# Patient Record
Sex: Female | Born: 1950 | Race: White | Hispanic: No | Marital: Married | Smoking: Never smoker
Health system: Southern US, Community
[De-identification: ages and names within clinical notes are randomized; demographics above are authoritative.]

## PROBLEM LIST (undated history)

## (undated) DIAGNOSIS — T7840XA Allergy, unspecified, initial encounter: Secondary | ICD-10-CM

## (undated) DIAGNOSIS — N390 Urinary tract infection, site not specified: Secondary | ICD-10-CM

## (undated) DIAGNOSIS — D649 Anemia, unspecified: Secondary | ICD-10-CM

## (undated) DIAGNOSIS — J189 Pneumonia, unspecified organism: Secondary | ICD-10-CM

## (undated) DIAGNOSIS — H9190 Unspecified hearing loss, unspecified ear: Secondary | ICD-10-CM

## (undated) DIAGNOSIS — E559 Vitamin D deficiency, unspecified: Secondary | ICD-10-CM

## (undated) DIAGNOSIS — M8580 Other specified disorders of bone density and structure, unspecified site: Secondary | ICD-10-CM

## (undated) DIAGNOSIS — F32A Depression, unspecified: Secondary | ICD-10-CM

## (undated) DIAGNOSIS — R7302 Impaired glucose tolerance (oral): Secondary | ICD-10-CM

## (undated) DIAGNOSIS — K219 Gastro-esophageal reflux disease without esophagitis: Secondary | ICD-10-CM

## (undated) DIAGNOSIS — I10 Essential (primary) hypertension: Secondary | ICD-10-CM

## (undated) DIAGNOSIS — F329 Major depressive disorder, single episode, unspecified: Secondary | ICD-10-CM

## (undated) DIAGNOSIS — K648 Other hemorrhoids: Secondary | ICD-10-CM

## (undated) DIAGNOSIS — E7800 Pure hypercholesterolemia, unspecified: Secondary | ICD-10-CM

## (undated) DIAGNOSIS — E663 Overweight: Secondary | ICD-10-CM

## (undated) DIAGNOSIS — K8020 Calculus of gallbladder without cholecystitis without obstruction: Secondary | ICD-10-CM

## (undated) DIAGNOSIS — F419 Anxiety disorder, unspecified: Secondary | ICD-10-CM

## (undated) HISTORY — DX: Anxiety disorder, unspecified: F41.9

## (undated) HISTORY — DX: Essential (primary) hypertension: I10

## (undated) HISTORY — PX: TUBAL LIGATION: SHX77

## (undated) HISTORY — PX: COLONOSCOPY: SHX174

## (undated) HISTORY — DX: Impaired glucose tolerance (oral): R73.02

## (undated) HISTORY — PX: UPPER GASTROINTESTINAL ENDOSCOPY: SHX188

## (undated) HISTORY — PX: POLYPECTOMY: SHX149

## (undated) HISTORY — PX: CHOLECYSTECTOMY: SHX55

## (undated) HISTORY — DX: Calculus of gallbladder without cholecystitis without obstruction: K80.20

## (undated) HISTORY — DX: Unspecified hearing loss, unspecified ear: H91.90

## (undated) HISTORY — PX: GALLBLADDER SURGERY: SHX652

## (undated) HISTORY — DX: Other specified disorders of bone density and structure, unspecified site: M85.80

## (undated) HISTORY — DX: Overweight: E66.3

## (undated) HISTORY — DX: Vitamin D deficiency, unspecified: E55.9

## (undated) HISTORY — DX: Urinary tract infection, site not specified: N39.0

## (undated) HISTORY — DX: Other hemorrhoids: K64.8

## (undated) HISTORY — PX: FOOT SURGERY: SHX648

## (undated) HISTORY — DX: Anemia, unspecified: D64.9

## (undated) HISTORY — DX: Allergy, unspecified, initial encounter: T78.40XA

---

## 1998-07-18 ENCOUNTER — Other Ambulatory Visit: Admission: RE | Admit: 1998-07-18 | Discharge: 1998-07-18 | Payer: Self-pay | Admitting: Family Medicine

## 1998-09-29 ENCOUNTER — Encounter: Admission: RE | Admit: 1998-09-29 | Discharge: 1998-12-28 | Payer: Self-pay | Admitting: Family Medicine

## 1998-10-28 ENCOUNTER — Ambulatory Visit (HOSPITAL_COMMUNITY): Admission: RE | Admit: 1998-10-28 | Discharge: 1998-10-28 | Payer: Self-pay | Admitting: Gastroenterology

## 1999-10-30 ENCOUNTER — Other Ambulatory Visit: Admission: RE | Admit: 1999-10-30 | Discharge: 1999-10-30 | Payer: Self-pay | Admitting: *Deleted

## 1999-11-24 ENCOUNTER — Encounter (INDEPENDENT_AMBULATORY_CARE_PROVIDER_SITE_OTHER): Payer: Self-pay

## 1999-11-24 ENCOUNTER — Ambulatory Visit (HOSPITAL_COMMUNITY): Admission: RE | Admit: 1999-11-24 | Discharge: 1999-11-24 | Payer: Self-pay | Admitting: Obstetrics and Gynecology

## 2001-01-06 ENCOUNTER — Other Ambulatory Visit: Admission: RE | Admit: 2001-01-06 | Discharge: 2001-01-06 | Payer: Self-pay | Admitting: Obstetrics and Gynecology

## 2002-03-29 ENCOUNTER — Other Ambulatory Visit: Admission: RE | Admit: 2002-03-29 | Discharge: 2002-03-29 | Payer: Self-pay | Admitting: Family Medicine

## 2003-10-30 ENCOUNTER — Other Ambulatory Visit: Admission: RE | Admit: 2003-10-30 | Discharge: 2003-10-30 | Payer: Self-pay | Admitting: Family Medicine

## 2005-01-20 ENCOUNTER — Other Ambulatory Visit: Admission: RE | Admit: 2005-01-20 | Discharge: 2005-01-20 | Payer: Self-pay | Admitting: Family Medicine

## 2010-06-05 NOTE — Op Note (Signed)
[facility]  Patient:    Brooke Ellis, Brooke Ellis                      MRN: [id number] Proc. Date: 11/24/99 Adm. Date:  [id number] Attending:  Suezanne Cheshire CC:         Suszanne Conners, M.D.   Operative Report  PREOPERATIVE DIAGNOSIS:  Postmenopausal bleeding due to endometrial polyp.  POSTOPERATIVE DIAGNOSIS:  Postmenopausal bleeding due to endometrial polyp.  OPERATION:  Hysteroscopic resection with curettage.  SURGEON:  Jolyn Nap, M.D.  ANESTHESIA:  MAC sedation with paracervical block.  ESTIMATED BLOOD LOSS:  Less than 20 cc.  SORBITOL INTAKE:  Net intake less than 50 cc.  SPECIMENS: 1. Resection of polyp. 2. Endometrial curettings.  COMPLICATIONS:  None.  INDICATIONS:  Brooke Ellis is a 60 year old menopausal female for the past four years who has been on Evista for the past two years.  She had an episode of postmenopausal bleeding in early October.  She underwent pelvic ultrasound with sonohysterogram revealing a 5 mm endometrial stripe with the sonohysterogram revealing a posterior endometrial polyp measuring 3 x 2 mm. The patient is therefore to undergo hysteroscopic evaluation and resection with curettage as indicated.  Full consent has been given.  DESCRIPTION OF PROCEDURE:  The patient was taken to the operating room and after proper identification and consents were ascertained, she was placed on the operating table in the supine position.  After the induction of MAC sedation, she was placed in the [place] and the perineum and vagina were prepped and draped in the routine sterile fashion.  A transurethral Foley was placed which was removed at the conclusion of the procedure.  Bimanual examination revealed an anterior normal size uterus.  A speculum was placed in the vagina and the cervix grasped with a tenaculum.  A paracervical block utilizing 10 cc of 1% plain Xylocaine was then placed circumferentially.   The internal os was initially dilated to a #23 Pratt dilator with insertion of the viewing scope.  Two small polyps were noted, one on the posterior fundus and one on the right posterior wall just above the lower uterine segment.  The remainder of the endometrial cavity appeared somewhat proliferative in appearance but there were no focal abnormalities.  Both tubal ostia were visualized.  The viewing scope was removed, and the cervix dilated up to a #33 Pratt dilator without difficulty.  The resectoscope was inserted again under direct vision with continuous sorbitol infusion.  The polyps were resected and sent as a separate specimen.  Sharp curettage was then performed which provided a minimal amount of additional tissue.  The patient tolerated this procedure quite well.  There was a minimal amount of bleeding.  All instruments were removed.  There was a slight amount of oozing from the tenaculum site which was stopped with local pressure.  There was no active bleeding from the cervix.  Net sorbitol intake was less than 50 cc.  Estimated blood loss was less than 20 cc.  Specimens were sent to pathology.  The patient was stable on transfer to the recovery room.  DISPOSITION:  She will be observed at discharge per anesthesia.  She will resume her routine medications including her Evista and take Advil or Aleve for any cramping.  She is to follow-up in four to six weeks time or sooner if there are any problems.  She is to call for excessive pain, cramping, fever or any other concerns. DD:  11/24/99 TD:  11/24/99 Job: [id number]

## 2010-10-26 ENCOUNTER — Encounter: Payer: Self-pay | Admitting: *Deleted

## 2010-10-26 ENCOUNTER — Emergency Department (HOSPITAL_BASED_OUTPATIENT_CLINIC_OR_DEPARTMENT_OTHER)
Admission: EM | Admit: 2010-10-26 | Discharge: 2010-10-26 | Disposition: A | Discharge status: Home / Self Care | Payer: BC Managed Care – PPO | Attending: Emergency Medicine | Admitting: Emergency Medicine

## 2010-10-26 ENCOUNTER — Emergency Department (INDEPENDENT_AMBULATORY_CARE_PROVIDER_SITE_OTHER): Payer: BC Managed Care – PPO

## 2010-10-26 DIAGNOSIS — W19XXXA Unspecified fall, initial encounter: Secondary | ICD-10-CM

## 2010-10-26 DIAGNOSIS — S40019A Contusion of unspecified shoulder, initial encounter: Secondary | ICD-10-CM | POA: Insufficient documentation

## 2010-10-26 DIAGNOSIS — E7800 Pure hypercholesterolemia, unspecified: Secondary | ICD-10-CM | POA: Insufficient documentation

## 2010-10-26 DIAGNOSIS — S0093XA Contusion of unspecified part of head, initial encounter: Secondary | ICD-10-CM

## 2010-10-26 DIAGNOSIS — M25519 Pain in unspecified shoulder: Secondary | ICD-10-CM

## 2010-10-26 DIAGNOSIS — S0003XA Contusion of scalp, initial encounter: Secondary | ICD-10-CM | POA: Insufficient documentation

## 2010-10-26 DIAGNOSIS — R079 Chest pain, unspecified: Secondary | ICD-10-CM

## 2010-10-26 DIAGNOSIS — S40029A Contusion of unspecified upper arm, initial encounter: Secondary | ICD-10-CM | POA: Insufficient documentation

## 2010-10-26 DIAGNOSIS — W108XXA Fall (on) (from) other stairs and steps, initial encounter: Secondary | ICD-10-CM | POA: Insufficient documentation

## 2010-10-26 DIAGNOSIS — Z79899 Other long term (current) drug therapy: Secondary | ICD-10-CM | POA: Insufficient documentation

## 2010-10-26 DIAGNOSIS — S1093XA Contusion of unspecified part of neck, initial encounter: Secondary | ICD-10-CM | POA: Insufficient documentation

## 2010-10-26 DIAGNOSIS — K219 Gastro-esophageal reflux disease without esophagitis: Secondary | ICD-10-CM | POA: Insufficient documentation

## 2010-10-26 DIAGNOSIS — Y92009 Unspecified place in unspecified non-institutional (private) residence as the place of occurrence of the external cause: Secondary | ICD-10-CM | POA: Insufficient documentation

## 2010-10-26 DIAGNOSIS — M19019 Primary osteoarthritis, unspecified shoulder: Secondary | ICD-10-CM

## 2010-10-26 HISTORY — DX: Gastro-esophageal reflux disease without esophagitis: K21.9

## 2010-10-26 HISTORY — DX: Major depressive disorder, single episode, unspecified: F32.9

## 2010-10-26 HISTORY — DX: Depression, unspecified: F32.A

## 2010-10-26 HISTORY — DX: Pure hypercholesterolemia, unspecified: E78.00

## 2010-10-26 MED ORDER — HYDROCODONE-ACETAMINOPHEN 5-325 MG PO TABS: 2.0000 | ORAL_TABLET | ORAL | Status: AC | PRN

## 2010-10-26 NOTE — ED Notes (Signed)
Tripped fell on step-pain to right shoulder-right top of head-no LOC

## 2010-10-26 NOTE — ED Provider Notes (Signed)
History     CSN: [id number] Arrival date & time: 10/26/2010  8:54 PM  Chief Complaint  Patient presents with  . Fall    (Consider location/radiation/quality/duration/timing/severity/associated sxs/prior treatment) Patient is a 60 y.o. female presenting with fall. The history is provided by the patient and a relative. No language interpreter was used.  Fall The accident occurred 1 to 2 hours ago. The fall occurred while standing. She fell from a height of 6 to 10 ft. She landed on carpet. There was no blood loss. The point of impact was the right shoulder. The pain is present in the head. The pain is at a severity of 8/10. The pain is moderate. She was ambulatory at the scene. There was no entrapment after the fall. There was no drug use involved in the accident. There was no alcohol use involved in the accident. Pertinent negatives include no visual change. The symptoms are aggravated by activity. She has tried nothing for the symptoms. The treatment provided no relief.  Pt reports she slipped on stairs and fell hitting her head on the board on the side of carpeted steps,  Pt was alone.  She reports she felt like she was going to black out.  Pt's daughter reports when she got there pt's pupils were irregular.  Past Medical History  Diagnosis Date  . High cholesterol   . GERD (gastroesophageal reflux disease)   . Asthma   . Depression     Past Surgical History  Procedure Date  . Cholecystectomy   . Tubal ligation   . Foot surgery     No family history on file.  History  Substance Use Topics  . Smoking status: Never Smoker   . Smokeless tobacco: Not on file  . Alcohol Use: No    OB History    Grav Para Term Preterm Abortions TAB SAB Ect Mult Living                  Review of Systems  Unable to perform ROS All other systems reviewed and are negative.    Allergies  Macrodantin; Morphine and related; and Sulfa antibiotics  Home Medications   Current Outpatient Rx    Name Route Sig Dispense Refill  . BUPROPION HCL (XL) 300 MG PO TB24 Oral Take 300 mg by mouth daily.      [name] Kitchen VITAMIN D 1000 UNITS PO TABS Oral Take 1,000 Units by mouth daily.      [name] Kitchen FLUOXETINE HCL 10 MG PO CAPS Oral Take 10 mg by mouth daily.      [name] Kitchen KETOTIFEN FUMARATE 0.025 % OP SOLN Both Eyes Place 1 drop into both eyes daily as needed. For seasonal allergies     . MONTELUKAST SODIUM 10 MG PO TABS Oral Take 10 mg by mouth at bedtime.      [name] Kitchen ONE-DAILY MULTI VITAMINS PO TABS Oral Take 1 tablet by mouth daily.      [name] Kitchen OMEPRAZOLE 20 MG PO CPDR Oral Take 20 mg by mouth daily.      [name] Kitchen SIMVASTATIN 40 MG PO TABS Oral Take 40 mg by mouth at bedtime.        BP 136/72  Pulse 98  Temp(Src) 98.2 F (36.8 C) (Oral)  Resp 16  Ht 5' (1.524 m)  Wt 150 lb (68.04 kg)  BMI 29.30 kg/m2  SpO2 98%  Physical Exam  Nursing note and vitals reviewed. Constitutional: She appears well-developed and well-nourished.  HENT:  Head: Normocephalic and atraumatic.  Right  Ear: External ear normal.  Left Ear: External ear normal.  Nose: Nose normal.  Mouth/Throat: Oropharynx is clear and moist.  Eyes: Conjunctivae and EOM are normal. Pupils are equal, round, and reactive to light.  Neck: Normal range of motion. Neck supple.  Cardiovascular: Normal rate and regular rhythm.   Pulmonary/Chest: Effort normal and breath sounds normal.  Abdominal: Soft.  Musculoskeletal: She exhibits tenderness.       Tender right shoulder and right upper ribs,  Tender area right forehead  Neurological: She is alert. A cranial nerve deficit is present. Coordination abnormal.  Skin: Skin is warm.  Psychiatric: She has a normal mood and affect.    ED Course  Procedures (including critical care time)  Labs Reviewed - No data to display Dg Ribs Unilateral W/chest Right  10/26/2010  *RADIOLOGY REPORT*  Clinical Data: Rib pain status post fall.  RIGHT RIBS AND CHEST - 3+ VIEW  Comparison: Contemporaneous shoulder radiograph  Findings:  Lungs are clear.  No displaced rib fracture identified. Surgical clips right upper quadrant.  IMPRESSION: No displaced fracture identified.  Original Report Authenticated By: Suanne Marker, M.D.   Dg Shoulder Right  10/26/2010  *RADIOLOGY REPORT*  Clinical Data: Right shoulder pain status post fall  RIGHT SHOULDER - 2+ VIEW  Comparison:  None  Findings: No acute fracture or dislocation.  Mild AC joint DJD. Right lung is clear.  IMPRESSION: No acute osseous abnormality.  Mild AC joint DJD.  Original Report Authenticated By: Suanne Marker, M.D.   Ct Head Wo Contrast  10/26/2010  *RADIOLOGY REPORT*  Clinical Data: Fall, trauma to the right side of the head.  CT HEAD WITHOUT CONTRAST  Technique:  Contiguous axial images were obtained from the base of the skull through the vertex without contrast.  Comparison: None.  Findings: There is no evidence for acute hemorrhage, hydrocephalus, mass lesion, or abnormal extra-axial fluid collection.  No definite CT evidence for acute infarction.  The visualized paranasal sinuses and mastoid air cells are predominately clear.  No displaced calvarial fracture. Small right superior scalp hematoma.  IMPRESSION: Small right scalp hematoma.  No underlying calvarial fracture or acute intracranial abnormality.  Original Report Authenticated By: Suanne Marker, M.D.     No diagnosis found.    MDM    No shoulder or rib fracture,  Ct head no fx      Alyse Low, [place] 10/26/10 [id number]

## 2010-10-26 NOTE — ED Provider Notes (Signed)
Medical screening examination/treatment/procedure(s) were performed by non-physician practitioner and as supervising physician I was immediately available for consultation/collaboration.   Hoy Morn, MD 10/26/10 [phone]

## 2012-03-10 ENCOUNTER — Emergency Department (HOSPITAL_BASED_OUTPATIENT_CLINIC_OR_DEPARTMENT_OTHER)
Admission: EM | Admit: 2012-03-10 | Discharge: 2012-03-10 | Disposition: A | Discharge status: Home / Self Care | Payer: BC Managed Care – PPO | Attending: Emergency Medicine | Admitting: Emergency Medicine

## 2012-03-10 ENCOUNTER — Encounter (HOSPITAL_BASED_OUTPATIENT_CLINIC_OR_DEPARTMENT_OTHER): Payer: Self-pay

## 2012-03-10 DIAGNOSIS — F3289 Other specified depressive episodes: Secondary | ICD-10-CM | POA: Insufficient documentation

## 2012-03-10 DIAGNOSIS — Y9289 Other specified places as the place of occurrence of the external cause: Secondary | ICD-10-CM | POA: Insufficient documentation

## 2012-03-10 DIAGNOSIS — Z79899 Other long term (current) drug therapy: Secondary | ICD-10-CM | POA: Insufficient documentation

## 2012-03-10 DIAGNOSIS — J45909 Unspecified asthma, uncomplicated: Secondary | ICD-10-CM | POA: Insufficient documentation

## 2012-03-10 DIAGNOSIS — E7800 Pure hypercholesterolemia, unspecified: Secondary | ICD-10-CM | POA: Insufficient documentation

## 2012-03-10 DIAGNOSIS — Y9389 Activity, other specified: Secondary | ICD-10-CM | POA: Insufficient documentation

## 2012-03-10 DIAGNOSIS — K219 Gastro-esophageal reflux disease without esophagitis: Secondary | ICD-10-CM | POA: Insufficient documentation

## 2012-03-10 DIAGNOSIS — S335XXA Sprain of ligaments of lumbar spine, initial encounter: Secondary | ICD-10-CM | POA: Insufficient documentation

## 2012-03-10 DIAGNOSIS — X500XXA Overexertion from strenuous movement or load, initial encounter: Secondary | ICD-10-CM | POA: Insufficient documentation

## 2012-03-10 MED ORDER — ORPHENADRINE CITRATE ER 100 MG PO TB12: 100.0000 mg | ORAL_TABLET | Freq: Two times a day (BID) | ORAL | Status: DC | PRN

## 2012-03-10 NOTE — ED Provider Notes (Signed)
History     CSN: [id number]  Arrival date & time 03/10/12  0919   First MD Initiated Contact with Patient 03/10/12 [phone]      Chief Complaint  Patient presents with  . Fall  . Back Pain    (Consider location/radiation/quality/duration/timing/severity/associated sxs/prior treatment) Patient is a 62 y.o. female presenting with fall and back pain. The history is provided by the patient.  Fall  Back Pain She slipped on a wet deck and twisted her lower back. She fell against the house but did not actually fall to the ground. She is complaining of pain in her lower back which is worse if she tries to twist or turn. Pain is moderate in intensity and was 5/10 at its worst. It is 3/10 currently. She denies any weakness or numbness feeling. She denies other injury.  Past Medical History  Diagnosis Date  . High cholesterol   . GERD (gastroesophageal reflux disease)   . Asthma   . Depression     Past Surgical History  Procedure Laterality Date  . Cholecystectomy    . Tubal ligation    . Foot surgery      No family history on file.  History  Substance Use Topics  . Smoking status: Never Smoker   . Smokeless tobacco: Not on file  . Alcohol Use: No    OB History   Grav Para Term Preterm Abortions TAB SAB Ect Mult Living                  Review of Systems  Musculoskeletal: Positive for back pain.  All other systems reviewed and are negative.    Allergies  Macrodantin; Morphine and related; and Sulfa antibiotics  Home Medications   Current Outpatient Rx  Name  Route  Sig  Dispense  Refill  . buPROPion (WELLBUTRIN XL) 300 MG 24 hr tablet   Oral   Take 300 mg by mouth daily.           . cholecalciferol (VITAMIN D) 1000 UNITS tablet   Oral   Take 1,000 Units by mouth daily.           [name] Kitchen FLUoxetine (PROZAC) 10 MG capsule   Oral   Take 10 mg by mouth daily.           [name] Kitchen ketotifen (ZADITOR) 0.025 % ophthalmic solution   Both Eyes   Place 1 drop into both  eyes daily as needed. For seasonal allergies          . montelukast (SINGULAIR) 10 MG tablet   Oral   Take 10 mg by mouth at bedtime.           . Multiple Vitamin (MULTIVITAMIN) tablet   Oral   Take 1 tablet by mouth daily.           [name] Kitchen omeprazole (PRILOSEC) 20 MG capsule   Oral   Take 20 mg by mouth daily.           . simvastatin (ZOCOR) 40 MG tablet   Oral   Take 40 mg by mouth at bedtime.             BP 127/83  Pulse 87  Temp(Src) 97.9 F (36.6 C) (Oral)  Resp 18  Ht 5' (1.524 m)  Wt 150 lb (68.04 kg)  BMI 29.3 kg/m2  SpO2 97%  Physical Exam  Nursing note and vitals reviewed.  62 year old female, resting comfortably and in no acute distress. Vital signs are  normal. Oxygen saturation is 97%, which is normal. Head is normocephalic and atraumatic. PERRLA, EOMI. Oropharynx is clear. Neck is nontender and supple without adenopathy or JVD. Back is nontender and there is no CVA tenderness. Mild to moderate paralumbar spasm is present bilaterally. Straight leg raise is negative. Lungs are clear without rales, wheezes, or rhonchi. Chest is nontender. Heart has regular rate and rhythm without murmur. Abdomen is soft, flat, nontender without masses or hepatosplenomegaly and peristalsis is normoactive. Extremities have no cyanosis or edema, full range of motion is present. Skin is warm and dry without rash. Neurologic: Mental status is normal, cranial nerves are intact, there are no motor or sensory deficits.  ED Course  Procedures (including critical care time)   1. Fall   2. Lumbar strain       MDM  Acute lumbosacral strain. She's given a prescription for orphenadrine to use as needed and she is instructed to use over-the-counter NSAIDs as needed.        Delora Fuel, MD [id number]

## 2012-03-10 NOTE — ED Notes (Signed)
Pt reports she slipped on the wet deck, twisting her back.  She now has back pain.

## 2013-08-27 ENCOUNTER — Ambulatory Visit (INDEPENDENT_AMBULATORY_CARE_PROVIDER_SITE_OTHER): Payer: BC Managed Care – PPO | Admitting: Podiatry

## 2013-08-27 ENCOUNTER — Encounter: Payer: Self-pay | Admitting: Podiatry

## 2013-08-27 ENCOUNTER — Ambulatory Visit (INDEPENDENT_AMBULATORY_CARE_PROVIDER_SITE_OTHER): Payer: BC Managed Care – PPO

## 2013-08-27 VITALS — BP 127/70 | HR 100 | Resp 18

## 2013-08-27 DIAGNOSIS — D492 Neoplasm of unspecified behavior of bone, soft tissue, and skin: Secondary | ICD-10-CM

## 2013-08-27 DIAGNOSIS — R52 Pain, unspecified: Secondary | ICD-10-CM

## 2013-08-27 NOTE — Progress Notes (Signed)
   Subjective:    Patient ID: Brooke Ellis, female    DOB: June 30, 1950, 63 y.o.   MRN: [id number]  HPI THERE IS A LUMP ON MY LEFT FOOT AND HAS BEEN THERE ABOUT 3 WEEKS AND IT DID HURT IN THE BEGINNING AND NOW IT IS JUST THERE AND IT HASN'T GOTTEN ANY BIGGER  This patient describes initial localized discomfort in the plantar soft tissue lesion on the left foot, however, now there is no discomfort in the area.  Review of Systems  All other systems reviewed and are negative.      Objective:   Physical Exam  Orientated x3 white female  Vascular: DP and PT pulses 2/4 bilaterally  Neurological: Sensation to 10 g monofilament wire intact 5/5 bilaterally Vibratory sensation intact bilaterally Ankle reflex equal and reactive bilaterally  Dermatological: Well-healed faint surgical scars dorsal first MPJ bilaterally Palpable soft tissue thickening plantar left first MPJ 20 mm in diameter that appears to be more adherent to the overlying skin versus the fascial slip. This palpable lesion is not tender.  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

## 2013-08-28 ENCOUNTER — Encounter: Payer: Self-pay | Admitting: Podiatry

## 2013-11-13 ENCOUNTER — Telehealth: Payer: Self-pay | Admitting: Internal Medicine

## 2013-11-27 ENCOUNTER — Encounter: Payer: Self-pay | Admitting: Internal Medicine

## 2013-12-03 ENCOUNTER — Ambulatory Visit: Payer: BC Managed Care – PPO | Admitting: Podiatry

## 2014-01-23 ENCOUNTER — Encounter: Payer: Self-pay | Admitting: Internal Medicine

## 2014-01-23 ENCOUNTER — Ambulatory Visit (INDEPENDENT_AMBULATORY_CARE_PROVIDER_SITE_OTHER): Payer: BLUE CROSS/BLUE SHIELD | Admitting: Internal Medicine

## 2014-01-23 ENCOUNTER — Other Ambulatory Visit (INDEPENDENT_AMBULATORY_CARE_PROVIDER_SITE_OTHER): Payer: BLUE CROSS/BLUE SHIELD

## 2014-01-23 VITALS — BP 130/72 | HR 84 | Ht 59.25 in | Wt 156.0 lb

## 2014-01-23 DIAGNOSIS — D509 Iron deficiency anemia, unspecified: Secondary | ICD-10-CM

## 2014-01-23 DIAGNOSIS — K219 Gastro-esophageal reflux disease without esophagitis: Secondary | ICD-10-CM

## 2014-01-23 LAB — IGA: IgA: 181 mg/dL (ref 68–378)

## 2014-01-23 MED ORDER — MOVIPREP 100 G PO SOLR: 1.0000 | Freq: Once | ORAL | Status: DC

## 2014-01-23 NOTE — Progress Notes (Signed)
HISTORY OF PRESENT ILLNESS:  Brooke Ellis is a 64 y.o. female with past medical history as listed below. She is sent today, by Dr. Sandi Ellis, regarding iron deficiency anemia. Outside records and cover letter reviewed. The patient presented for her routine physical examination 11/07/2013. Laboratories revealed a hemoglobin of 9.3. She was microcytic with iron deficiency (ferritin level 5). At that time, she felt nonspecific fatigue. In addition, breakthrough reflux symptoms on omeprazole 20 mg daily (previously 40 mg daily). Her omeprazole no was increased to 20 mg twice a day and she was placed on iron sulfate 325 mg daily. Follow-up hemoglobin on iron therapy was improved at 11.0. Patient reports better control of reflux symptoms. GI review of systems is otherwise remarkable for occasional constipation or diarrhea. However, no change from her baseline. She did submit Hemoccult cards in December which were negative. The patient denies being a blood donor. No vaginal or urologic bleeding. No family history of blood dyscrasia. Her husband has celiac disease. Her daughter has Crohn's disease. The patient did undergo routine screening colonoscopy with Dr. Teena Ellis 05/28/2008. Examination was normal except for internal hemorrhoids.  REVIEW OF SYSTEMS:  All non-GI ROS negative except for fatigue  Past Medical History  Diagnosis Date  . High cholesterol   . GERD (gastroesophageal reflux disease)   . Asthma   . Depression   . Internal hemorrhoids   . Vitamin D deficiency   . Hearing loss   . Impaired glucose tolerance   . Osteopenia   . Overweight   . Anemia   . Gallstones   . Hypertension   . Urinary tract infection     Past Surgical History  Procedure Laterality Date  . Cholecystectomy    . Tubal ligation    . Foot surgery    . Gallbladder surgery      Social History Brooke Ellis  reports that she has never smoked. She has never used smokeless tobacco. She reports that she does  not drink alcohol or use illicit drugs.  family history includes Crohn's disease in her daughter; Diverticulitis in her sister. There is no history of Colon cancer, Colon polyps, Diabetes, Kidney disease, Gallbladder disease, or Esophageal cancer.  Allergies  Allergen Reactions  . Macrodantin     Chemical pneumonia   . Morphine And Related     hallucinations   . Sulfa Antibiotics Itching and Swelling       PHYSICAL EXAMINATION: Vital signs: BP 130/72 mmHg  Pulse 84  Ht 4' 11.25" (1.505 m)  Wt 156 lb (70.761 kg)  BMI 31.24 kg/m2  Constitutional: generally well-appearing, no acute distress Psychiatric: alert and oriented x3, cooperative Eyes: extraocular movements intact, anicteric, conjunctiva pink Mouth: oral pharynx moist, no lesions Neck: supple no lymphadenopathy Cardiovascular: heart regular rate and rhythm, no murmur Lungs: clear to auscultation bilaterally Abdomen: soft, nontender, nondistended, no obvious ascites, no peritoneal signs, normal bowel sounds, no organomegaly Rectal: Deferred until colonoscopy Extremities: no lower extremity edema bilaterally Skin: no lesions on visible extremities Neuro: No focal deficits.   ASSESSMENT:  #1. Iron deficiency anemia. Etiology unclear. Hemoccult negative stool. Rule out GI mucosal lesions such as interval colorectal neoplasia, gastrointestinal AVMs, or Cameron erosions associated with hiatal hernia. Also, rule out iron absorption disorder (e.g. celiac disease). #2. Chronic GERD #3. Screening colonoscopy 2010. Negative   PLAN:  #1. Colonoscopy and upper endoscopy to evaluate iron deficiency anemia.The nature of the procedure, as well as the risks, benefits, and alternatives were carefully and thoroughly reviewed with  the patient. Ample time for discussion and questions allowed. The patient understood, was satisfied, and agreed to proceed. #2. Continue iron until one week prior to colonoscopy, then hold to help facilitate  prep #3. Hold metformin prior to the procedure #4. Screen for celiac disease with tissue transglutaminase antibody IgA and serum IgA level #5. If above workup is unrevealing, then consider capsule endoscopy. We discussed this in detail.

## 2014-01-23 NOTE — Patient Instructions (Addendum)
You have been scheduled for an endoscopy and colonoscopy. Please follow the written instructions given to you at your visit today. Please pick up your prep at the pharmacy within the next 1-3 days. If you use inhalers (even only as needed), please bring them with you on the day of your procedure. Your physician has requested that you go to www.startemmi.com and enter the access code given to you at your visit today. This web site gives a general overview about your procedure. However, you should still follow specific instructions given to you by our office regarding your preparation for the procedure.   Your physician has requested that you go to the basement for the following lab work before leaving today:  TTG, IGA

## 2014-01-24 LAB — TISSUE TRANSGLUTAMINASE, IGA: Tissue Transglutaminase Ab, IgA: 1 U/mL (ref <4)

## 2014-01-28 NOTE — Telephone Encounter (Signed)
Pt seen

## 2014-01-29 ENCOUNTER — Encounter: Payer: Self-pay | Admitting: Internal Medicine

## 2014-02-14 ENCOUNTER — Encounter: Payer: Self-pay | Admitting: Internal Medicine

## 2014-02-14 ENCOUNTER — Ambulatory Visit (AMBULATORY_SURGERY_CENTER): Payer: BLUE CROSS/BLUE SHIELD | Admitting: Internal Medicine

## 2014-02-14 VITALS — BP 138/66 | HR 74 | Temp 98.4°F | Resp 27 | Ht 59.0 in | Wt 156.0 lb

## 2014-02-14 DIAGNOSIS — D123 Benign neoplasm of transverse colon: Secondary | ICD-10-CM

## 2014-02-14 DIAGNOSIS — K449 Diaphragmatic hernia without obstruction or gangrene: Secondary | ICD-10-CM

## 2014-02-14 DIAGNOSIS — K2100 Gastro-esophageal reflux disease with esophagitis, without bleeding: Secondary | ICD-10-CM

## 2014-02-14 DIAGNOSIS — D509 Iron deficiency anemia, unspecified: Secondary | ICD-10-CM

## 2014-02-14 DIAGNOSIS — D120 Benign neoplasm of cecum: Secondary | ICD-10-CM

## 2014-02-14 MED ORDER — SODIUM CHLORIDE 0.9 % IV SOLN: 500.0000 mL | INTRAVENOUS | Status: DC

## 2014-02-14 MED ORDER — OMEPRAZOLE 40 MG PO CPDR: DELAYED_RELEASE_CAPSULE | ORAL | Status: AC

## 2014-02-14 NOTE — Patient Instructions (Signed)
Discharge instructions given. Handouts on polyps,diverticulosis, and hiatal hernia. Repeat CBC in 4 weeks office will schedule. Resume previous medications. Your may notice some irritation in your nose or some drainage.  This may cause feelings of congestion.  This is from the oxygen, which can be very drying.  There is no need for concern, this should clear up in a day or so YOU HAD AN ENDOSCOPIC PROCEDURE TODAY AT [facility]: Refer to the procedure report that was given to you for any specific questions about what was found during the examination.  If the procedure report does not answer your questions, please call your gastroenterologist to clarify.  If you requested that your care partner not be given the details of your procedure findings, then the procedure report has been included in a sealed envelope for you to review at your convenience later.  YOU SHOULD EXPECT: Some feelings of bloating in the abdomen. Passage of more gas than usual.  Walking can help get rid of the air that was put into your GI tract during the procedure and reduce the bloating. If you had a lower endoscopy (such as a colonoscopy or flexible sigmoidoscopy) you may notice spotting of blood in your stool or on the toilet paper. If you underwent a bowel prep for your procedure, then you may not have a normal bowel movement for a few days.  DIET: Your first meal following the procedure should be a light meal and then it is ok to progress to your normal diet.  A half-sandwich or bowl of soup is an example of a good first meal.  Heavy or fried foods are harder to digest and may make you feel nauseous or bloated.  Likewise meals heavy in dairy and vegetables can cause extra gas to form and this can also increase the bloating.  Drink plenty of fluids but you should avoid alcoholic beverages for 24 hours.  ACTIVITY: Your care partner should take you home directly after the procedure.  You should plan to take it easy,  moving slowly for the rest of the day.  You can resume normal activity the day after the procedure however you should NOT DRIVE or use heavy machinery for 24 hours (because of the sedation medicines used during the test).    SYMPTOMS TO REPORT IMMEDIATELY: A gastroenterologist can be reached at any hour.  During normal business hours, 8:30 AM to 5:00 PM Monday through Friday, call [phone].  After hours and on weekends, please call the GI answering service at [phone] who will take a message and have the physician on call contact you.   Following lower endoscopy (colonoscopy or flexible sigmoidoscopy):  Excessive amounts of blood in the stool  Significant tenderness or worsening of abdominal pains  Swelling of the abdomen that is new, acute  Fever of 100F or higher  Following upper endoscopy (EGD)  Vomiting of blood or coffee ground material  New chest pain or pain under the shoulder blades  Painful or persistently difficult swallowing  New shortness of breath  Fever of 100F or higher  Black, tarry-looking stools  FOLLOW UP: If any biopsies were taken you will be contacted by phone or by letter within the next 1-3 weeks.  Call your gastroenterologist if you have not heard about the biopsies in 3 weeks.  Our staff will call the home number listed on your records the next business day following your procedure to check on you and address any questions or concerns  that you may have at that time regarding the information given to you following your procedure. This is a courtesy call and so if there is no answer at the home number and we have not heard from you through the emergency physician on call, we will assume that you have returned to your regular daily activities without incident.  SIGNATURES/CONFIDENTIALITY: You and/or your care partner have signed paperwork which will be entered into your electronic medical record.  These signatures attest to the fact that that the  information above on your After Visit Summary has been reviewed and is understood.  Full responsibility of the confidentiality of this discharge information lies with you and/or your care-partner.

## 2014-02-14 NOTE — Progress Notes (Signed)
Patient awakening,vss,report to rn 

## 2014-02-14 NOTE — Op Note (Signed)
[place]  [address] [place], [id number]   ENDOSCOPY PROCEDURE REPORT  PATIENT: Brooke Ellis, Brooke Ellis  MR#: [id number] BIRTHDATE: 1951-01-14 , 63  yrs. old GENDER: female ENDOSCOPIST: Eustace Quail, MD REFERRED BY:  Derinda Late, M.D. PROCEDURE DATE:  02/14/2014 PROCEDURE:  EGD, diagnostic ASA CLASS:     Class II INDICATIONS:  history of esophageal reflux and iron deficiency anemia. MEDICATIONS: Monitored anesthesia care and Propofol 200 mg IV TOPICAL ANESTHETIC: none  DESCRIPTION OF PROCEDURE: After the risks benefits and alternatives of the procedure were thoroughly explained, informed consent was obtained.  The LB [id number] endoscope was introduced through the mouth and advanced to the second portion of the duodenum , Without limitations.  The instrument was slowly withdrawn as the mucosa was fully examined.  EXAM:The esophagus was foreshortened with free refluxate.  The distal mucosa was inflamed with probable short segment Barrett's esophagus.  The stomach revealed a large (7 cm) hiatal hernia with associated Cameron erosions and associated low-grade bleeding.  The stomach was otherwise normal.  The duodenum was normal. Retroflexed views revealed a hiatal hernia.     The scope was then withdrawn from the patient and the procedure completed. COMPLICATIONS: There were no immediate complications.  ENDOSCOPIC IMPRESSION: 1. GERD with esophagitis and probable short segment Barrett's 2. Large hiatal hernia with associated Cameron erosions. This is the cause for iron deficiency anemia  RECOMMENDATIONS: 1.  Resume iron therapy 325 mg twice daily for 4 weeks then once daily indefinitely 2. Increase omeprazole to 40 mg twice daily 3. Repeat CBC in 4 weeks 4. Schedule repeat upper Endoscopy with Dr. Henrene Pastor in 8 weeks. We will assess for mucosal healing and underlying Barrett's esophagus.  REPEAT EXAM:  eSigned:  Eustace Quail, MD 02/14/2014  3:22 PM    [id number] Blomgren, MD and The Patient

## 2014-02-14 NOTE — Progress Notes (Signed)
Called to room to assist during endoscopic procedure.  Patient ID and intended procedure confirmed with present staff. Received instructions for my participation in the procedure from the performing physician.  

## 2014-02-14 NOTE — Op Note (Signed)
[place]  [address] [place], [id number]   COLONOSCOPY PROCEDURE REPORT  PATIENT: Brooke Ellis, Brooke Ellis  MR#: [id number] BIRTHDATE: 1950/11/02 , 63  yrs. old GENDER: female ENDOSCOPIST: Eustace Quail, MD REFERRED [id number] Sandi Mariscal, M.D. PROCEDURE DATE:  02/14/2014 PROCEDURE:   Colonoscopy with snare polypectomy x 2 First Screening Colonoscopy - Avg.  risk and is 50 yrs.  old or older - No.  Prior Negative Screening - Now for repeat screening. N/A  History of Adenoma - Now for follow-up colonoscopy & has been > or = to 3 yrs.  N/A  Polyps Removed Today? Yes. ASA CLASS:   Class II INDICATIONS:iron deficiency anemia. Previous colonoscopy with Dr. Amedeo Plenty May 2010 without neoplasia. MEDICATIONS: Monitored anesthesia care and Propofol 300 mg IV  DESCRIPTION OF PROCEDURE:   After the risks benefits and alternatives of the procedure were thoroughly explained, informed consent was obtained.  The digital rectal exam revealed no abnormalities of the rectum.   The LB [id number]  endoscope was introduced through the anus and advanced to the cecum, which was identified by both the appendix and ileocecal valve. No adverse events experienced.   The quality of the prep was excellent, using MoviPrep  The instrument was then slowly withdrawn as the colon was fully examined.  COLON FINDINGS: The examined terminal ileum appeared to be normal. Two polyps measuring 5 mm in size were found in the transverse colon and at the cecum.  A polypectomy was performed with a cold snare.  The resection was complete, the polyp tissue was completely retrieved and sent to histology.   There was mild diverticulosis noted in the sigmoid colon.   The examination was otherwise normal. Retroflexed views revealed internal hemorrhoids. The time to cecum=2 minutes 19 seconds.  Withdrawal time=10 minutes 34 seconds. The scope was withdrawn and the procedure completed. COMPLICATIONS: There were no  immediate complications.  ENDOSCOPIC IMPRESSION: 1.   The examined terminal ileum appeared to be normal 2.   Two polyps were found in the transverse colon and at the cecum; polypectomy was performed with a cold snare 3.   Mild diverticulosis was noted in the sigmoid colon 4.   The examination was otherwise normal  RECOMMENDATIONS: 1.  Repeat colonoscopy in 5 years if polyp adenomatous; otherwise 10 years 2.  Upper endoscopy performed today (please see report)  eSigned:  Eustace Quail, MD 02/14/2014 3:14 PM   cc: Derinda Late, MD and The Patient

## 2014-02-15 ENCOUNTER — Other Ambulatory Visit: Payer: Self-pay

## 2014-02-15 ENCOUNTER — Telehealth: Payer: Self-pay | Admitting: *Deleted

## 2014-02-15 DIAGNOSIS — D6489 Other specified anemias: Secondary | ICD-10-CM

## 2014-02-15 NOTE — Telephone Encounter (Signed)
  Follow up Call-  Call back number 02/14/2014  Post procedure Call Back phone  # [phone]  Permission to leave phone message Yes     Patient questions:  Do you have a fever, pain , or abdominal swelling? No. Pain Score  0 *  Have you tolerated food without any problems? Yes.    Have you been able to return to your normal activities? Yes.    Do you have any questions about your discharge instructions: Diet   No. Medications  No. Follow up visit  No.  Do you have questions or concerns about your Care? No   Actions: * If pain score is 4 or above: No action needed, pain <4.  Pt did leave an inhaler here, found under bed in [place] [address]. Will tag and put at front desk for patient to pick up.

## 2014-02-19 ENCOUNTER — Encounter: Payer: Self-pay | Admitting: Internal Medicine

## 2014-03-13 ENCOUNTER — Telehealth: Payer: Self-pay

## 2014-03-13 ENCOUNTER — Other Ambulatory Visit (INDEPENDENT_AMBULATORY_CARE_PROVIDER_SITE_OTHER): Payer: BLUE CROSS/BLUE SHIELD

## 2014-03-13 DIAGNOSIS — D6489 Other specified anemias: Secondary | ICD-10-CM

## 2014-03-13 LAB — CBC WITH DIFFERENTIAL/PLATELET
BASOS ABS: 0.1 10*3/uL (ref 0.0–0.1)
Basophils Relative: 1.1 % (ref 0.0–3.0)
Eosinophils Absolute: 0.1 10*3/uL (ref 0.0–0.7)
Eosinophils Relative: 2.6 % (ref 0.0–5.0)
HCT: 38.7 % (ref 36.0–46.0)
Hemoglobin: 13 g/dL (ref 12.0–15.0)
LYMPHS ABS: 2.1 10*3/uL (ref 0.7–4.0)
LYMPHS PCT: 37.1 % (ref 12.0–46.0)
MCHC: 33.7 g/dL (ref 30.0–36.0)
MCV: 80.7 fl (ref 78.0–100.0)
MONOS PCT: 8.9 % (ref 3.0–12.0)
Monocytes Absolute: 0.5 10*3/uL (ref 0.1–1.0)
NEUTROS PCT: 50.3 % (ref 43.0–77.0)
Neutro Abs: 2.8 10*3/uL (ref 1.4–7.7)
PLATELETS: 313 10*3/uL (ref 150.0–400.0)
RBC: 4.79 Mil/uL (ref 3.87–5.11)
RDW: 17.8 % — AB (ref 11.5–15.5)
WBC: 5.6 10*3/uL (ref 4.0–10.5)

## 2014-03-13 NOTE — Telephone Encounter (Signed)
-----   Message from Maury Dus, RN sent at 02/15/2014  4:04 PM EST ----- Regarding: CBC Needs cbc, order in

## 2014-03-13 NOTE — Telephone Encounter (Signed)
Pt states she is coming tomorrow for labs.

## 2014-04-03 ENCOUNTER — Ambulatory Visit (AMBULATORY_SURGERY_CENTER): Payer: Self-pay

## 2014-04-03 VITALS — Ht 59.0 in | Wt 154.0 lb

## 2014-04-03 DIAGNOSIS — K449 Diaphragmatic hernia without obstruction or gangrene: Secondary | ICD-10-CM

## 2014-04-03 NOTE — Progress Notes (Signed)
No allergies to eggs or soy No diet/weight loss meds No home oxygen No past problems with anesthesia

## 2014-04-10 ENCOUNTER — Encounter: Payer: Self-pay | Admitting: Internal Medicine

## 2014-04-10 ENCOUNTER — Ambulatory Visit (AMBULATORY_SURGERY_CENTER): Payer: BLUE CROSS/BLUE SHIELD | Admitting: Internal Medicine

## 2014-04-10 VITALS — BP 113/75 | HR 81 | Temp 98.1°F | Resp 18 | Ht 59.0 in | Wt 154.0 lb

## 2014-04-10 DIAGNOSIS — K2100 Gastro-esophageal reflux disease with esophagitis, without bleeding: Secondary | ICD-10-CM

## 2014-04-10 DIAGNOSIS — K449 Diaphragmatic hernia without obstruction or gangrene: Secondary | ICD-10-CM | POA: Diagnosis not present

## 2014-04-10 MED ORDER — SODIUM CHLORIDE 0.9 % IV SOLN: 500.0000 mL | INTRAVENOUS | Status: DC

## 2014-04-10 NOTE — Patient Instructions (Signed)
YOU HAD AN ENDOSCOPIC PROCEDURE TODAY AT [facility]:   Refer to the procedure report that was given to you for any specific questions about what was found during the examination.  If the procedure report does not answer your questions, please call your gastroenterologist to clarify.  If you requested that your care partner not be given the details of your procedure findings, then the procedure report has been included in a sealed envelope for you to review at your convenience later.  YOU SHOULD EXPECT: Some feelings of bloating in the abdomen. Passage of more gas than usual.  Walking can help get rid of the air that was put into your GI tract during the procedure and reduce the bloating. If you had a lower endoscopy (such as a colonoscopy or flexible sigmoidoscopy) you may notice spotting of blood in your stool or on the toilet paper. If you underwent a bowel prep for your procedure, you may not have a normal bowel movement for a few days.  Please Note:  You might notice some irritation and congestion in your nose or some drainage.  This is from the oxygen used during your procedure.  There is no need for concern and it should clear up in a day or so.  SYMPTOMS TO REPORT IMMEDIATELY:   Following upper endoscopy (EGD)  Vomiting of blood or coffee ground material  New chest pain or pain under the shoulder blades  Painful or persistently difficult swallowing  New shortness of breath  Fever of 100F or higher  Black, tarry-looking stools  For urgent or emergent issues, a gastroenterologist can be reached at any hour by calling [phone].   DIET: Your first meal following the procedure should be a small meal and then it is ok to progress to your normal diet. Heavy or fried foods are harder to digest and may make you feel nauseous or bloated.  Likewise, meals heavy in dairy and vegetables can increase bloating.  Drink plenty of fluids but you should avoid alcoholic beverages for  24 hours.  ACTIVITY:  You should plan to take it easy for the rest of today and you should NOT DRIVE or use heavy machinery until tomorrow (because of the sedation medicines used during the test).    FOLLOW UP: Our staff will call the number listed on your records the next business day following your procedure to check on you and address any questions or concerns that you may have regarding the information given to you following your procedure. If we do not reach you, we will leave a message.  However, if you are feeling well and you are not experiencing any problems, there is no need to return our call.  We will assume that you have returned to your regular daily activities without incident.  If any biopsies were taken you will be contacted by phone or by letter within the next 1-3 weeks.  Please call us at [phone] if you have not heard about the biopsies in 3 weeks.    SIGNATURES/CONFIDENTIALITY: You and/or your care partner have signed paperwork which will be entered into your electronic medical record.  These signatures attest to the fact that that the information above on your After Visit Summary has been reviewed and is understood.  Full responsibility of the confidentiality of this discharge information lies with you and/or your care-partner.  Recommendations Await biopsy results. Discharge instructions given to patient and/or care partner. Omeprazole 40 mg twice daily and continue iron  therapy. Esophagitis handouts provided.

## 2014-04-10 NOTE — Progress Notes (Signed)
Called to room to assist during endoscopic procedure.  Patient ID and intended procedure confirmed with present staff. Received instructions for my participation in the procedure from the performing physician.  

## 2014-04-10 NOTE — Op Note (Signed)
[place]  [address] [place], [id number]   ENDOSCOPY PROCEDURE REPORT  PATIENT: Brooke Ellis, Brooke Ellis  MR#: [id number] BIRTHDATE: 03-01-1950 , 64  yrs. old GENDER: female ENDOSCOPIST: Eustace Quail, MD REFERRED BY:  Derinda Late, M.D. PROCEDURE DATE:  04/10/2014 PROCEDURE:  EGD w/ biopsy ASA CLASS:     Class II INDICATIONS:  history of esophageal reflux. Previous endoscopy 8 weeks ago with ulcerative esophagitis and possible underlying Barrett's. Has been on twice daily PPI therapy and is now for relook endoscopy to assess for mucosal healing and rule out Barrett's. MEDICATIONS: Monitored anesthesia care and Propofol 160 mg IV TOPICAL ANESTHETIC: none  DESCRIPTION OF PROCEDURE: After the risks benefits and alternatives of the procedure were thoroughly explained, informed consent was obtained.  The LB [id number] endoscope was introduced through the mouth and advanced to the second portion of the duodenum , Without limitations.  The instrument was slowly withdrawn as the mucosa was fully examined.  EXAM:The esophagus revealed columnar type mucosa involving the distal 2-3 cm of the esophagus.  This was consistent with Barrett's.  Previous acute esophagitis has healed.  Biopsies taken. The stomach again demonstrated a large (7 cm) hiatal hernia with erosions.  Several fundic gland polyps.  Otherwise normal.  The duodenum was normal.  Retroflexed views revealed a hiatal hernia. The scope was then withdrawn from the patient and the procedure completed.  COMPLICATIONS: There were no immediate complications.  ENDOSCOPIC IMPRESSION: 1. GERD. Erosive esophagitis healed on twice daily omeprazole 2. Columnar type mucosa in the distal esophagus. Rule out Barrett's 3. Large hiatal hernia with Lysbeth Galas erosions  RECOMMENDATIONS: 1.  Await biopsy results. If nondysplastic Barrett's, then repeat endoscopy with biopsies in one year 2.  Continue omeprazole 40 mg  twice daily 3. Continue iron therapy daily  REPEAT EXAM:  eSigned:  Eustace Quail, MD 04/10/2014 1:47 PM    [id number] Blomgren, MD and The Patient

## 2014-04-10 NOTE — Progress Notes (Signed)
A/ox3 pleased with MAC, report to Wendy RN 

## 2014-04-11 ENCOUNTER — Telehealth: Payer: Self-pay | Admitting: *Deleted

## 2014-04-11 NOTE — Telephone Encounter (Signed)
Message left

## 2014-04-16 ENCOUNTER — Encounter: Payer: Self-pay | Admitting: Internal Medicine

## 2015-03-03 ENCOUNTER — Other Ambulatory Visit: Payer: Self-pay | Admitting: Family Medicine

## 2015-04-03 ENCOUNTER — Encounter: Payer: Self-pay | Admitting: Internal Medicine

## 2015-04-08 ENCOUNTER — Encounter: Payer: Self-pay | Admitting: Internal Medicine

## 2015-05-13 ENCOUNTER — Ambulatory Visit: Payer: BLUE CROSS/BLUE SHIELD

## 2015-05-13 VITALS — Ht 60.0 in | Wt 164.0 lb

## 2015-05-13 DIAGNOSIS — K2270 Barrett's esophagus without dysplasia: Secondary | ICD-10-CM

## 2015-05-13 NOTE — Progress Notes (Signed)
No allergies to eggs or soy No past problems with anesthesia No home oxygen No diet meds  Has email and internet; refused emmi 

## 2015-05-27 ENCOUNTER — Encounter: Payer: Self-pay | Admitting: Internal Medicine

## 2015-06-10 ENCOUNTER — Ambulatory Visit (AMBULATORY_SURGERY_CENTER): Payer: BLUE CROSS/BLUE SHIELD | Admitting: Internal Medicine

## 2015-06-10 ENCOUNTER — Encounter: Payer: Self-pay | Admitting: Internal Medicine

## 2015-06-10 VITALS — BP 117/70 | HR 76 | Temp 99.1°F | Resp 13 | Ht 60.0 in | Wt 164.0 lb

## 2015-06-10 DIAGNOSIS — K2270 Barrett's esophagus without dysplasia: Secondary | ICD-10-CM | POA: Diagnosis present

## 2015-06-10 DIAGNOSIS — K2100 Gastro-esophageal reflux disease with esophagitis, without bleeding: Secondary | ICD-10-CM

## 2015-06-10 MED ORDER — SODIUM CHLORIDE 0.9 % IV SOLN: 500.0000 mL | INTRAVENOUS | Status: DC

## 2015-06-10 NOTE — Progress Notes (Signed)
Report to PACU, RN, vss, BBS= Clear.  

## 2015-06-10 NOTE — Op Note (Signed)
[facility] Patient Name: Brooke Ellis Procedure Date: 06/10/2015 11:26 AM MRN: [id number] Endoscopist: Docia Chuck. Henrene Pastor , MD Age: 65 Referring MD:  Date of Birth: 12-04-1950 Gender: Female Procedure:                Upper GI endoscopy, with biopsies Indications:              Surveillance for malignancy due to personal history                            of Barrett's esophagus. 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 The patient's tolerance of                            previous anesthesia was also reviewed. The risks                            and benefits of the procedure and the sedation                            options and risks were discussed with the patient.                            All questions were answered, and informed consent                            was obtained. Prior Anticoagulants: The patient has                            taken no previous anticoagulant or antiplatelet                            agents. ASA Grade Assessment: II - A patient with                            mild systemic disease. After reviewing the risks                            and benefits, the patient was deemed in  satisfactory condition to undergo the procedure.                           After obtaining informed consent, the endoscope was                            passed under direct vision. Throughout the                            procedure, the patient's blood pressure, pulse, and                            oxygen saturations were monitored continuously. The                            Model [id number]  [id number]) scope was introduced                            through the mouth, and advanced to the second part                            of duodenum. The upper GI endoscopy was                            accomplished without difficulty. The patient                            tolerated the procedure well. Scope In: Scope Out: Findings:                 There were esophageal mucosal changes secondary to                            established short-segment Barrett's disease present                            in the lower third of the esophagus. The maximum                            longitudinal extent of these mucosal changes was 2                            cm in length (C2,M2). There was no obvious                            inflammation or nodularity.Mucosa was biopsied with                            a cold forceps for histology in 4 quadrants at                            intervals of 1 cm. One specimen bottle was sent to  pathology (9 biopsies).                           The exam of the esophagus was otherwise normal.                           A large hiatal hernia was present (8 cm).                           No other significant abnormalities were identified                            in a careful examination of the stomach.                           The examined duodenum was normal.                           The cardia and gastric fundus were normal on                            retroflexion, save hiatal hernia. Complications:            No immediate complications. Estimated Blood Loss:     Estimated blood loss: none. Impression:               - Esophageal mucosal changes secondary to                            established short-segment Barrett's disease.                            Biopsied.                           - Large hiatal hernia.                           - Normal examined duodenum. Recommendation:           - Patient has a contact number  available for                            emergencies. The signs and symptoms of potential                            delayed complications were discussed with the                            patient. Return to normal activities tomorrow.                            Written discharge instructions were provided to the                            patient.                           -  Resume previous diet.                           - Continue present medications.                           - Await pathology results.                           - Repeat upper endoscopy in 3 years for                            surveillance, if no dysplasia on biopsies. Docia Chuck. Henrene Pastor, MD 06/10/2015 11:52:33 AM This report has been signed electronically. CC Letter to:             Derinda Late

## 2015-06-10 NOTE — Progress Notes (Signed)
Pt. Complains of sore area left upper, inner lip.  Slight redness noted.  Offered ice. Pt. Refused.

## 2015-06-10 NOTE — Patient Instructions (Addendum)
YOU HAD AN ENDOSCOPIC PROCEDURE TODAY AT [facility]:   Refer to the procedure report that was given to you for any specific questions about what was found during the examination.  If the procedure report does not answer your questions, please call your gastroenterologist to clarify.  If you requested that your care partner not be given the details of your procedure findings, then the procedure report has been included in a sealed envelope for you to review at your convenience later.  YOU SHOULD EXPECT: Some feelings of bloating in the abdomen. Passage of more gas than usual.  Walking can help get rid of the air that was put into your GI tract during the procedure and reduce the bloating. If you had a lower endoscopy (such as a colonoscopy or flexible sigmoidoscopy) you may notice spotting of blood in your stool or on the toilet paper. If you underwent a bowel prep for your procedure, you may not have a normal bowel movement for a few days.  Please Note:  You might notice some irritation and congestion in your nose or some drainage.  This is from the oxygen used during your procedure.  There is no need for concern and it should clear up in a day or so.  SYMPTOMS TO REPORT IMMEDIATELY:   Following upper endoscopy (EGD)  Vomiting of blood or coffee ground material  New chest pain or pain under the shoulder blades  Painful or persistently difficult swallowing  New shortness of breath  Fever of 100F or higher  Black, tarry-looking stools  For urgent or emergent issues, a gastroenterologist can be reached at any hour by calling [phone].   DIET: Your first meal following the procedure should be a small meal and then it is ok to progress to your normal diet. Heavy or fried foods are harder to digest and may make you feel nauseous or bloated.  Likewise, meals heavy in dairy and vegetables can increase bloating.  Drink plenty of fluids but you should avoid alcoholic beverages for  24 hours.  ACTIVITY:  You should plan to take it easy for the rest of today and you should NOT DRIVE or use heavy machinery until tomorrow (because of the sedation medicines used during the test).    FOLLOW UP: Our staff will call the number listed on your records the next business day following your procedure to check on you and address any questions or concerns that you may have regarding the information given to you following your procedure. If we do not reach you, we will leave a message.  However, if you are feeling well and you are not experiencing any problems, there is no need to return our call.  We will assume that you have returned to your regular daily activities without incident.  If any biopsies were taken you will be contacted by phone or by letter within the next 1-3 weeks.  Please call us at [phone] if you have not heard about the biopsies in 3 weeks.    SIGNATURES/CONFIDENTIALITY: You and/or your care partner have signed paperwork which will be entered into your electronic medical record.  These signatures attest to the fact that that the information above on your After Visit Summary has been reviewed and is understood.  Full responsibility of the confidentiality of this discharge information lies with you and/or your care-partner.  Hiatal hernia  Information given.  Repeat upper endoscopy in 3 years for durveillance if no dysplasia on biopsies.

## 2015-06-10 NOTE — Progress Notes (Signed)
Called to room to assist during endoscopic procedure.  Patient ID and intended procedure confirmed with present staff. Received instructions for my participation in the procedure from the performing physician.  

## 2015-06-11 ENCOUNTER — Telehealth: Payer: Self-pay

## 2015-06-11 NOTE — Telephone Encounter (Signed)
  Follow up Call-  Call back number 06/10/2015 04/10/2014 02/14/2014  Post procedure Call Back phone  # [phone] cell [phone]  Permission to leave phone message Yes Yes Yes     Patient questions:  Do you have a fever, pain , or abdominal swelling? No. Pain Score  0 *  Have you tolerated food without any problems? Yes.    Have you been able to return to your normal activities? Yes.    Do you have any questions about your discharge instructions: Diet   No. Medications  No. Follow up visit  No.  Do you have questions or concerns about your Care? No.  Actions: * If pain score is 4 or above: No action needed, pain <4.

## 2015-06-16 ENCOUNTER — Encounter: Payer: Self-pay | Admitting: Internal Medicine

## 2017-12-29 ENCOUNTER — Ambulatory Visit
Admission: RE | Admit: 2017-12-29 | Discharge: 2017-12-29 | Disposition: A | Discharge status: Home / Self Care | Payer: Medicare Other | Source: Ambulatory Visit | Attending: Family Medicine | Admitting: Family Medicine

## 2017-12-29 ENCOUNTER — Other Ambulatory Visit: Payer: Self-pay | Admitting: Family Medicine

## 2017-12-29 DIAGNOSIS — M5432 Sciatica, left side: Secondary | ICD-10-CM

## 2018-03-13 ENCOUNTER — Other Ambulatory Visit: Payer: Self-pay | Admitting: Family Medicine

## 2018-03-13 DIAGNOSIS — M5416 Radiculopathy, lumbar region: Secondary | ICD-10-CM

## 2018-03-13 DIAGNOSIS — R202 Paresthesia of skin: Secondary | ICD-10-CM

## 2018-03-13 DIAGNOSIS — M79605 Pain in left leg: Secondary | ICD-10-CM

## 2018-03-23 ENCOUNTER — Ambulatory Visit
Admission: RE | Admit: 2018-03-23 | Discharge: 2018-03-23 | Disposition: A | Discharge status: Home / Self Care | Payer: Medicare Other | Source: Ambulatory Visit | Attending: Family Medicine | Admitting: Family Medicine

## 2018-03-23 DIAGNOSIS — R202 Paresthesia of skin: Secondary | ICD-10-CM

## 2018-03-23 DIAGNOSIS — M5416 Radiculopathy, lumbar region: Secondary | ICD-10-CM

## 2018-03-23 DIAGNOSIS — M79605 Pain in left leg: Secondary | ICD-10-CM

## 2018-03-24 ENCOUNTER — Other Ambulatory Visit: Payer: Medicare Other

## 2018-03-29 ENCOUNTER — Other Ambulatory Visit: Payer: Self-pay | Admitting: Family Medicine

## 2018-03-29 DIAGNOSIS — G8929 Other chronic pain: Secondary | ICD-10-CM

## 2018-03-29 DIAGNOSIS — M545 Low back pain, unspecified: Secondary | ICD-10-CM

## 2018-04-05 ENCOUNTER — Other Ambulatory Visit: Payer: Self-pay

## 2018-04-05 ENCOUNTER — Other Ambulatory Visit: Payer: Medicare Other

## 2018-04-05 ENCOUNTER — Ambulatory Visit
Admission: RE | Admit: 2018-04-05 | Discharge: 2018-04-05 | Disposition: A | Discharge status: Home / Self Care | Payer: Medicare Other | Source: Ambulatory Visit | Attending: Family Medicine | Admitting: Family Medicine

## 2018-04-05 ENCOUNTER — Other Ambulatory Visit: Payer: Self-pay | Admitting: Family Medicine

## 2018-04-05 DIAGNOSIS — M545 Low back pain, unspecified: Secondary | ICD-10-CM

## 2018-04-05 DIAGNOSIS — G8929 Other chronic pain: Secondary | ICD-10-CM

## 2018-04-05 MED ORDER — IOPAMIDOL (ISOVUE-M 200) INJECTION 41%
1.0000 mL | Freq: Once | INTRAMUSCULAR | Status: AC
  Administered 2018-04-05: 1 mL via EPIDURAL

## 2018-04-05 MED ORDER — METHYLPREDNISOLONE ACETATE 40 MG/ML INJ SUSP (RADIOLOG
120.0000 mg | Freq: Once | INTRAMUSCULAR | Status: AC
  Administered 2018-04-05: 120 mg via EPIDURAL

## 2018-04-05 NOTE — Discharge Instructions (Signed)

## 2019-02-15 ENCOUNTER — Encounter: Payer: Self-pay | Admitting: Internal Medicine

## 2019-02-19 DIAGNOSIS — E7800 Pure hypercholesterolemia, unspecified: Secondary | ICD-10-CM | POA: Insufficient documentation

## 2019-02-19 DIAGNOSIS — K449 Diaphragmatic hernia without obstruction or gangrene: Secondary | ICD-10-CM | POA: Insufficient documentation

## 2019-02-19 DIAGNOSIS — R7302 Impaired glucose tolerance (oral): Secondary | ICD-10-CM | POA: Insufficient documentation

## 2019-02-19 DIAGNOSIS — K219 Gastro-esophageal reflux disease without esophagitis: Secondary | ICD-10-CM | POA: Insufficient documentation

## 2019-02-19 DIAGNOSIS — D649 Anemia, unspecified: Secondary | ICD-10-CM | POA: Insufficient documentation

## 2019-02-21 ENCOUNTER — Ambulatory Visit (AMBULATORY_SURGERY_CENTER): Payer: Self-pay

## 2019-02-21 ENCOUNTER — Other Ambulatory Visit: Payer: Self-pay

## 2019-02-21 DIAGNOSIS — Z86010 Personal history of colonic polyps: Secondary | ICD-10-CM

## 2019-02-21 DIAGNOSIS — K2270 Barrett's esophagus without dysplasia: Secondary | ICD-10-CM

## 2019-02-21 DIAGNOSIS — Z01818 Encounter for other preprocedural examination: Secondary | ICD-10-CM

## 2019-02-21 MED ORDER — NA SULFATE-K SULFATE-MG SULF 17.5-3.13-1.6 GM/177ML PO SOLN: 1.0000 | Freq: Once | ORAL | 0 refills | Status: AC

## 2019-02-21 NOTE — Progress Notes (Signed)

## 2019-03-01 ENCOUNTER — Other Ambulatory Visit: Payer: Self-pay | Admitting: Internal Medicine

## 2019-03-01 ENCOUNTER — Ambulatory Visit (INDEPENDENT_AMBULATORY_CARE_PROVIDER_SITE_OTHER): Payer: Medicare Other

## 2019-03-01 DIAGNOSIS — Z1159 Encounter for screening for other viral diseases: Secondary | ICD-10-CM

## 2019-03-02 LAB — SARS CORONAVIRUS 2 (TAT 6-24 HRS): SARS Coronavirus 2: NEGATIVE

## 2019-03-06 ENCOUNTER — Other Ambulatory Visit: Payer: Self-pay

## 2019-03-06 ENCOUNTER — Ambulatory Visit (AMBULATORY_SURGERY_CENTER): Payer: Medicare Other | Admitting: Internal Medicine

## 2019-03-06 ENCOUNTER — Encounter: Payer: Self-pay | Admitting: Internal Medicine

## 2019-03-06 VITALS — BP 95/58 | HR 80 | Temp 97.1°F | Resp 12 | Ht 62.0 in | Wt 151.0 lb

## 2019-03-06 DIAGNOSIS — K2270 Barrett's esophagus without dysplasia: Secondary | ICD-10-CM

## 2019-03-06 DIAGNOSIS — D125 Benign neoplasm of sigmoid colon: Secondary | ICD-10-CM

## 2019-03-06 DIAGNOSIS — D123 Benign neoplasm of transverse colon: Secondary | ICD-10-CM

## 2019-03-06 DIAGNOSIS — Z86010 Personal history of colonic polyps: Secondary | ICD-10-CM | POA: Diagnosis not present

## 2019-03-06 DIAGNOSIS — K2950 Unspecified chronic gastritis without bleeding: Secondary | ICD-10-CM

## 2019-03-06 DIAGNOSIS — D122 Benign neoplasm of ascending colon: Secondary | ICD-10-CM

## 2019-03-06 MED ORDER — SODIUM CHLORIDE 0.9 % IV SOLN: 500.0000 mL | Freq: Once | INTRAVENOUS | Status: DC

## 2019-03-06 NOTE — Progress Notes (Signed)
Called to room to assist during endoscopic procedure.  Patient ID and intended procedure confirmed with present staff. Received instructions for my participation in the procedure from the performing physician.  

## 2019-03-06 NOTE — Op Note (Signed)
[facility] Patient Name: Brooke Ellis Procedure Date: 03/06/2019 8:03 AM MRN: [id number] Endoscopist: Docia Chuck. Henrene Pastor , MD Age: 69 Referring MD:  Date of Birth: 1950-03-22 Gender: Female Account #: [id number] Procedure:                Colonoscopy with cold snare polypectomy x 6 Indications:              High risk colon cancer surveillance: Personal                            history of non-advanced adenoma. Previous                            examinations 2010 (elsewhere, no polyps); 2016 (2                            adenomas) Medicines:                Monitored Anesthesia Care Procedure:                Pre-Anesthesia Assessment:                           - Prior to the procedure, a History and Physical                            was performed, and patient medications and                            allergies were reviewed. The patient's tolerance of                            previous anesthesia was also reviewed. The risks                            and benefits of the procedure and the sedation                            options and risks were discussed with the patient.                            All questions were answered, and informed consent                            was obtained. Prior Anticoagulants: The patient has                            taken no previous anticoagulant or antiplatelet                            agents. ASA Grade Assessment: II - A patient with                            mild systemic disease. After reviewing the risks  and benefits, the patient was deemed in                            satisfactory condition to undergo the procedure.                           After obtaining informed consent, the colonoscope                            was passed under direct vision. Throughout the                            procedure, the patient's blood pressure, pulse, and                            oxygen saturations were  monitored continuously. The                            Colonoscope was introduced through the anus and                            advanced to the the cecum, identified by                            appendiceal orifice and ileocecal valve. The                            ileocecal valve, appendiceal orifice, and rectum                            were photographed. The quality of the bowel                            preparation was excellent. The colonoscopy was                            performed without difficulty. The patient tolerated                            the procedure well. The bowel preparation used was                            SUPREP via split dose instruction. Scope In: 8:28:00 AM Scope Out: 8:45:25 AM Total Procedure Duration: 0 hours 17 minutes 25 seconds  Findings:                 Six polyps were found in the sigmoid colon,                            transverse colon and ascending colon. The polyps                            were 2 to 8 mm in size. These polyps were removed  with a cold snare. Resection and retrieval were                            complete.                           Multiple small-mouthed diverticula were found in                            the sigmoid colon.                           Internal hemorrhoids were found during retroflexion.                           The exam was otherwise without abnormality on                            direct and retroflexion views. Complications:            No immediate complications. Estimated blood loss:                            None. Estimated Blood Loss:     Estimated blood loss: none. Impression:               - Six 2 to 8 mm polyps in the sigmoid colon, in the                            transverse colon and in the ascending colon,                            removed with a cold snare. Resected and retrieved.                           - Diverticulosis in the sigmoid colon.                            - Internal hemorrhoids.                           - The examination was otherwise normal on direct                            and retroflexion views. Recommendation:           - Repeat colonoscopy in 3 years for surveillance.                           - Patient has a contact number available for                            emergencies. The signs and symptoms of potential                            delayed complications were discussed with the  patient. Return to normal activities tomorrow.                            Written discharge instructions were provided to the                            patient.                           - Resume previous diet.                           - Continue present medications.                           - Await pathology results. Docia Chuck. Henrene Pastor, MD 03/06/2019 8:59:46 AM This report has been signed electronically.

## 2019-03-06 NOTE — Progress Notes (Signed)
Pt's states no medical or surgical changes since previsit or office visit.  [place]

## 2019-03-06 NOTE — Op Note (Signed)
[facility] Patient Name: Brooke Ellis Procedure Date: 03/06/2019 8:02 AM MRN: [id number] Endoscopist: Docia Chuck. Henrene Pastor , MD Age: 69 Referring MD:  Date of Birth: 1950-06-20 Gender: Female Account #: [id number] Procedure:                Upper GI endoscopy with biopsies Indications:              Surveillance for malignancy due to personal history                            of Barrett's esophagus. Previous examination 2017 Medicines:                Monitored Anesthesia Care Procedure:                Pre-Anesthesia Assessment:                           - Prior to the procedure, a History and Physical                            was performed, and patient medications and                            allergies were reviewed. The patient's tolerance of                            previous anesthesia was also reviewed. The risks                            and benefits of the procedure and the sedation                            options and risks were discussed with the patient.                            All questions were answered, and informed consent                            was obtained. Prior Anticoagulants: The patient has                            taken no previous anticoagulant or antiplatelet                            agents. ASA Grade Assessment: II - A patient with                            mild systemic disease. After reviewing the risks                            and benefits, the patient was deemed in                            satisfactory condition to undergo the procedure.  After obtaining informed consent, the endoscope was                            passed under direct vision. Throughout the                            procedure, the patient's blood pressure, pulse, and                            oxygen saturations were monitored continuously. The                            Endoscope was introduced through the mouth, and            advanced to the second part of duodenum. The upper                            GI endoscopy was accomplished without difficulty.                            The patient tolerated the procedure well. Scope In: Scope Out: Findings:                 There were esophageal mucosal changes secondary to                            established short-segment Barrett's disease present                            in the lower third of the esophagus. The maximum                            longitudinal extent of these mucosal changes was 2                            cm in length (C2, M2). There was no active                            inflammation, nodularity, or other irregularity.                            The mucosa was biopsied with a cold forceps for                            histology.                           The stomach was normal except for a large hiatal                            hernia.                           The examined duodenum was normal.  The cardia and gastric fundus were normal on                            retroflexion. Complications:            No immediate complications. Estimated Blood Loss:     Estimated blood loss: none. Impression:               - Esophageal mucosal changes secondary to                            established short-segment Barrett's disease.                            Biopsied.                           - Normal stomach with large hiatal hernia.                           - Normal examined duodenum. Recommendation:           - Patient has a contact number available for                            emergencies. The signs and symptoms of potential                            delayed complications were discussed with the                            patient. Return to normal activities tomorrow.                            Written discharge instructions were provided to the                            patient.                            - Resume previous diet.                           - Continue present medications.                           - Await pathology results. Repeat EGD with biopsies                            in 3 years if no dysplasia. Docia Chuck. Henrene Pastor, MD 03/06/2019 9:04:41 AM This report has been signed electronically.

## 2019-03-06 NOTE — Progress Notes (Signed)
Pt tolerated well. VSS. Awake and to recovery. Oral bite block atraumatic insertion and removal. Lips intact.

## 2019-03-06 NOTE — Patient Instructions (Signed)
Handouts on hiatal hernia, polyps, diverticulosis and hemorrhoids given to you today  Await pathology results    YOU HAD AN ENDOSCOPIC PROCEDURE TODAY AT [facility]:   Refer to the procedure report that was given to you for any specific questions about what was found during the examination.  If the procedure report does not answer your questions, please call your gastroenterologist to clarify.  If you requested that your care partner not be given the details of your procedure findings, then the procedure report has been included in a sealed envelope for you to review at your convenience later.  YOU SHOULD EXPECT: Some feelings of bloating in the abdomen. Passage of more gas than usual.  Walking can help get rid of the air that was put into your GI tract during the procedure and reduce the bloating. If you had a lower endoscopy (such as a colonoscopy or flexible sigmoidoscopy) you may notice spotting of blood in your stool or on the toilet paper. If you underwent a bowel prep for your procedure, you may not have a normal bowel movement for a few days.  Please Note:  You might notice some irritation and congestion in your nose or some drainage.  This is from the oxygen used during your procedure.  There is no need for concern and it should clear up in a day or so.  SYMPTOMS TO REPORT IMMEDIATELY:   Following lower endoscopy (colonoscopy or flexible sigmoidoscopy):  Excessive amounts of blood in the stool  Significant tenderness or worsening of abdominal pains  Swelling of the abdomen that is new, acute  Fever of 100F or higher   Following upper endoscopy (EGD)  Vomiting of blood or coffee ground material  New chest pain or pain under the shoulder blades  Painful or persistently difficult swallowing  New shortness of breath  Fever of 100F or higher  Black, tarry-looking stools  For urgent or emergent issues, a gastroenterologist can be reached at any hour by calling [phone]  [phone].   DIET:  We do recommend a small meal at first, but then you may proceed to your regular diet.  Drink plenty of fluids but you should avoid alcoholic beverages for 24 hours.  ACTIVITY:  You should plan to take it easy for the rest of today and you should NOT DRIVE or use heavy machinery until tomorrow (because of the sedation medicines used during the test).    FOLLOW UP: Our staff will call the number listed on your records 48-72 hours following your procedure to check on you and address any questions or concerns that you may have regarding the information given to you following your procedure. If we do not reach you, we will leave a message.  We will attempt to reach you two times.  During this call, we will ask if you have developed any symptoms of COVID 19. If you develop any symptoms (ie: fever, flu-like symptoms, shortness of breath, cough etc.) before then, please call [phone].  If you test positive for Covid 19 in the 2 weeks post procedure, please call and report this information to [place].    If any biopsies were taken you will be contacted by phone or by letter within the next 1-3 weeks.  Please call us at [phone] if you have not heard about the biopsies in 3 weeks.    SIGNATURES/CONFIDENTIALITY: You and/or your care partner have signed paperwork which will be entered into your electronic medical record.  These signatures  attest to the fact that that the information above on your After Visit Summary has been reviewed and is understood.  Full responsibility of the confidentiality of this discharge information lies with you and/or your care-partner.

## 2019-03-08 ENCOUNTER — Encounter: Payer: Self-pay | Admitting: Internal Medicine

## 2019-03-09 ENCOUNTER — Telehealth: Payer: Self-pay | Admitting: *Deleted

## 2019-03-09 NOTE — Telephone Encounter (Signed)
  Follow up Call-  Call back number 03/06/2019  Post procedure Call Back phone  # [phone]  Permission to leave phone message Yes  Some recent data might be hidden     Patient questions:  Do you have a fever, pain , or abdominal swelling? No. Pain Score  0 *  Have you tolerated food without any problems? Yes.    Have you been able to return to your normal activities? Yes.    Do you have any questions about your discharge instructions: Diet   No. Medications  No. Follow up visit  No.  Do you have questions or concerns about your Care? No.  Actions: * If pain score is 4 or above: No action needed, pain <4.   1. Have you developed a fever since your procedure? no  2.   Have you had an respiratory symptoms (SOB or cough) since your procedure? no  3.   Have you tested positive for COVID 19 since your procedure n0  4.   Have you had any family members/close contacts diagnosed with the COVID 19 since your procedure?  no   If yes to any of these questions please route to Joylene John, RN and Alphonsa Gin, Therapist, sports.

## 2019-03-28 ENCOUNTER — Inpatient Hospital Stay (HOSPITAL_BASED_OUTPATIENT_CLINIC_OR_DEPARTMENT_OTHER)
Admission: EM | Admit: 2019-03-28 | Discharge: 2019-04-07 | DRG: 166 | Disposition: A | Discharge status: Home / Self Care | Payer: Medicare Other | Attending: Student | Admitting: Student

## 2019-03-28 ENCOUNTER — Encounter (HOSPITAL_BASED_OUTPATIENT_CLINIC_OR_DEPARTMENT_OTHER): Payer: Self-pay | Admitting: Emergency Medicine

## 2019-03-28 ENCOUNTER — Other Ambulatory Visit: Payer: Self-pay

## 2019-03-28 ENCOUNTER — Emergency Department (HOSPITAL_BASED_OUTPATIENT_CLINIC_OR_DEPARTMENT_OTHER): Payer: Medicare Other

## 2019-03-28 DIAGNOSIS — J189 Pneumonia, unspecified organism: Secondary | ICD-10-CM | POA: Diagnosis present

## 2019-03-28 DIAGNOSIS — D75839 Thrombocytosis, unspecified: Secondary | ICD-10-CM | POA: Diagnosis present

## 2019-03-28 DIAGNOSIS — D649 Anemia, unspecified: Secondary | ICD-10-CM | POA: Diagnosis present

## 2019-03-28 DIAGNOSIS — K2270 Barrett's esophagus without dysplasia: Secondary | ICD-10-CM | POA: Diagnosis present

## 2019-03-28 DIAGNOSIS — E7800 Pure hypercholesterolemia, unspecified: Secondary | ICD-10-CM | POA: Diagnosis present

## 2019-03-28 DIAGNOSIS — Z881 Allergy status to other antibiotic agents status: Secondary | ICD-10-CM

## 2019-03-28 DIAGNOSIS — D509 Iron deficiency anemia, unspecified: Secondary | ICD-10-CM | POA: Diagnosis present

## 2019-03-28 DIAGNOSIS — J869 Pyothorax without fistula: Secondary | ICD-10-CM | POA: Diagnosis not present

## 2019-03-28 DIAGNOSIS — M8580 Other specified disorders of bone density and structure, unspecified site: Secondary | ICD-10-CM | POA: Diagnosis present

## 2019-03-28 DIAGNOSIS — D500 Iron deficiency anemia secondary to blood loss (chronic): Secondary | ICD-10-CM | POA: Diagnosis not present

## 2019-03-28 DIAGNOSIS — E785 Hyperlipidemia, unspecified: Secondary | ICD-10-CM | POA: Diagnosis present

## 2019-03-28 DIAGNOSIS — R7302 Impaired glucose tolerance (oral): Secondary | ICD-10-CM | POA: Diagnosis present

## 2019-03-28 DIAGNOSIS — E860 Dehydration: Secondary | ICD-10-CM | POA: Diagnosis present

## 2019-03-28 DIAGNOSIS — E559 Vitamin D deficiency, unspecified: Secondary | ICD-10-CM | POA: Diagnosis present

## 2019-03-28 DIAGNOSIS — D72825 Bandemia: Secondary | ICD-10-CM | POA: Diagnosis not present

## 2019-03-28 DIAGNOSIS — D638 Anemia in other chronic diseases classified elsewhere: Secondary | ICD-10-CM | POA: Diagnosis present

## 2019-03-28 DIAGNOSIS — F39 Unspecified mood [affective] disorder: Secondary | ICD-10-CM | POA: Diagnosis not present

## 2019-03-28 DIAGNOSIS — J850 Gangrene and necrosis of lung: Secondary | ICD-10-CM | POA: Diagnosis present

## 2019-03-28 DIAGNOSIS — F329 Major depressive disorder, single episode, unspecified: Secondary | ICD-10-CM | POA: Diagnosis present

## 2019-03-28 DIAGNOSIS — Z9689 Presence of other specified functional implants: Secondary | ICD-10-CM

## 2019-03-28 DIAGNOSIS — J90 Pleural effusion, not elsewhere classified: Secondary | ICD-10-CM

## 2019-03-28 DIAGNOSIS — Z79899 Other long term (current) drug therapy: Secondary | ICD-10-CM

## 2019-03-28 DIAGNOSIS — E871 Hypo-osmolality and hyponatremia: Secondary | ICD-10-CM | POA: Diagnosis present

## 2019-03-28 DIAGNOSIS — E119 Type 2 diabetes mellitus without complications: Secondary | ICD-10-CM | POA: Diagnosis present

## 2019-03-28 DIAGNOSIS — J45909 Unspecified asthma, uncomplicated: Secondary | ICD-10-CM | POA: Diagnosis present

## 2019-03-28 DIAGNOSIS — D62 Acute posthemorrhagic anemia: Secondary | ICD-10-CM | POA: Diagnosis not present

## 2019-03-28 DIAGNOSIS — Z882 Allergy status to sulfonamides status: Secondary | ICD-10-CM

## 2019-03-28 DIAGNOSIS — S22080D Wedge compression fracture of T11-T12 vertebra, subsequent encounter for fracture with routine healing: Secondary | ICD-10-CM | POA: Diagnosis not present

## 2019-03-28 DIAGNOSIS — Z4682 Encounter for fitting and adjustment of non-vascular catheter: Secondary | ICD-10-CM

## 2019-03-28 DIAGNOSIS — J851 Abscess of lung with pneumonia: Secondary | ICD-10-CM | POA: Diagnosis present

## 2019-03-28 DIAGNOSIS — Z20822 Contact with and (suspected) exposure to covid-19: Secondary | ICD-10-CM | POA: Diagnosis present

## 2019-03-28 DIAGNOSIS — K5900 Constipation, unspecified: Secondary | ICD-10-CM | POA: Diagnosis not present

## 2019-03-28 DIAGNOSIS — K449 Diaphragmatic hernia without obstruction or gangrene: Secondary | ICD-10-CM | POA: Diagnosis present

## 2019-03-28 DIAGNOSIS — E876 Hypokalemia: Secondary | ICD-10-CM

## 2019-03-28 DIAGNOSIS — Z885 Allergy status to narcotic agent status: Secondary | ICD-10-CM | POA: Diagnosis not present

## 2019-03-28 DIAGNOSIS — J9690 Respiratory failure, unspecified, unspecified whether with hypoxia or hypercapnia: Secondary | ICD-10-CM | POA: Diagnosis present

## 2019-03-28 DIAGNOSIS — E1122 Type 2 diabetes mellitus with diabetic chronic kidney disease: Secondary | ICD-10-CM

## 2019-03-28 DIAGNOSIS — E1165 Type 2 diabetes mellitus with hyperglycemia: Secondary | ICD-10-CM | POA: Diagnosis not present

## 2019-03-28 DIAGNOSIS — Z7984 Long term (current) use of oral hypoglycemic drugs: Secondary | ICD-10-CM

## 2019-03-28 DIAGNOSIS — K219 Gastro-esophageal reflux disease without esophagitis: Secondary | ICD-10-CM | POA: Diagnosis present

## 2019-03-28 DIAGNOSIS — J9601 Acute respiratory failure with hypoxia: Secondary | ICD-10-CM

## 2019-03-28 DIAGNOSIS — J852 Abscess of lung without pneumonia: Secondary | ICD-10-CM

## 2019-03-28 DIAGNOSIS — D473 Essential (hemorrhagic) thrombocythemia: Secondary | ICD-10-CM | POA: Diagnosis present

## 2019-03-28 DIAGNOSIS — M4854XD Collapsed vertebra, not elsewhere classified, thoracic region, subsequent encounter for fracture with routine healing: Secondary | ICD-10-CM | POA: Diagnosis present

## 2019-03-28 DIAGNOSIS — K22719 Barrett's esophagus with dysplasia, unspecified: Secondary | ICD-10-CM | POA: Diagnosis not present

## 2019-03-28 DIAGNOSIS — Z09 Encounter for follow-up examination after completed treatment for conditions other than malignant neoplasm: Secondary | ICD-10-CM

## 2019-03-28 DIAGNOSIS — R7989 Other specified abnormal findings of blood chemistry: Secondary | ICD-10-CM | POA: Diagnosis present

## 2019-03-28 DIAGNOSIS — N186 End stage renal disease: Secondary | ICD-10-CM

## 2019-03-28 DIAGNOSIS — Z992 Dependence on renal dialysis: Secondary | ICD-10-CM

## 2019-03-28 LAB — FERRITIN: Ferritin: 97 ng/mL (ref 11–307)

## 2019-03-28 LAB — COMPREHENSIVE METABOLIC PANEL
ALT: 30 U/L (ref 0–44)
AST: 36 U/L (ref 15–41)
Albumin: 2.6 g/dL — ABNORMAL LOW (ref 3.5–5.0)
Alkaline Phosphatase: 166 U/L — ABNORMAL HIGH (ref 38–126)
Anion gap: 16 — ABNORMAL HIGH (ref 5–15)
BUN: 8 mg/dL (ref 8–23)
CO2: 30 mmol/L (ref 22–32)
Calcium: 8.5 mg/dL — ABNORMAL LOW (ref 8.9–10.3)
Chloride: 88 mmol/L — ABNORMAL LOW (ref 98–111)
Creatinine, Ser: 0.9 mg/dL (ref 0.44–1.00)
GFR calc Af Amer: >60 mL/min (ref >60)
GFR calc non Af Amer: >60 mL/min (ref >60)
Glucose, Bld: 127 mg/dL — ABNORMAL HIGH (ref 70–99)
Potassium: 2.8 mmol/L — ABNORMAL LOW (ref 3.5–5.1)
Sodium: 134 mmol/L — ABNORMAL LOW (ref 135–145)
Total Bilirubin: 0.9 mg/dL (ref 0.3–1.2)
Total Protein: 6.6 g/dL (ref 6.5–8.1)

## 2019-03-28 LAB — LACTATE DEHYDROGENASE: LDH: 153 U/L (ref 98–192)

## 2019-03-28 LAB — CBC WITH DIFFERENTIAL/PLATELET
Abs Immature Granulocytes: 0.15 10*3/uL — ABNORMAL HIGH (ref 0.00–0.07)
Basophils Absolute: 0.1 10*3/uL (ref 0.0–0.1)
Basophils Relative: 0 %
Eosinophils Absolute: 0.1 10*3/uL (ref 0.0–0.5)
Eosinophils Relative: 0 %
HCT: 37 % (ref 36.0–46.0)
Hemoglobin: 11.7 g/dL — ABNORMAL LOW (ref 12.0–15.0)
Immature Granulocytes: 1 %
Lymphocytes Relative: 6 %
Lymphs Abs: 0.9 10*3/uL (ref 0.7–4.0)
MCH: 26.7 pg (ref 26.0–34.0)
MCHC: 31.6 g/dL (ref 30.0–36.0)
MCV: 84.5 fL (ref 80.0–100.0)
Monocytes Absolute: 1.4 10*3/uL — ABNORMAL HIGH (ref 0.1–1.0)
Monocytes Relative: 10 %
Neutro Abs: 12.1 10*3/uL — ABNORMAL HIGH (ref 1.7–7.7)
Neutrophils Relative %: 83 %
Platelets: 594 10*3/uL — ABNORMAL HIGH (ref 150–400)
RBC: 4.38 MIL/uL (ref 3.87–5.11)
RDW: 13.2 % (ref 11.5–15.5)
WBC: 14.6 10*3/uL — ABNORMAL HIGH (ref 4.0–10.5)
nRBC: 0 % (ref 0.0–0.2)

## 2019-03-28 LAB — SARS CORONAVIRUS 2 AG (30 MIN TAT): SARS Coronavirus 2 Ag: NEGATIVE

## 2019-03-28 LAB — TRIGLYCERIDES: Triglycerides: 72 mg/dL (ref <150)

## 2019-03-28 LAB — RESPIRATORY PANEL BY RT PCR (FLU A&B, COVID)
Influenza A by PCR: NEGATIVE
Influenza B by PCR: NEGATIVE
SARS Coronavirus 2 by RT PCR: NEGATIVE

## 2019-03-28 LAB — LACTIC ACID, PLASMA
Lactic Acid, Venous: 1.9 mmol/L (ref 0.5–1.9)
Lactic Acid, Venous: 1.1 mmol/L (ref 0.5–1.9)

## 2019-03-28 LAB — FIBRINOGEN: Fibrinogen: >800 mg/dL — ABNORMAL HIGH (ref 210–475)

## 2019-03-28 LAB — C-REACTIVE PROTEIN: CRP: 27.3 mg/dL — ABNORMAL HIGH (ref <1.0)

## 2019-03-28 LAB — D-DIMER, QUANTITATIVE: D-Dimer, Quant: 2.19 ug/mL-FEU — ABNORMAL HIGH (ref 0.00–0.50)

## 2019-03-28 LAB — HIV ANTIBODY (ROUTINE TESTING W REFLEX): HIV Screen 4th Generation wRfx: NONREACTIVE

## 2019-03-28 LAB — POTASSIUM: Potassium: 2.7 mmol/L — CL (ref 3.5–5.1)

## 2019-03-28 LAB — MAGNESIUM: Magnesium: 1.2 mg/dL — ABNORMAL LOW (ref 1.7–2.4)

## 2019-03-28 LAB — PROCALCITONIN: Procalcitonin: 0.12 ng/mL

## 2019-03-28 MED ORDER — ACETAMINOPHEN 325 MG PO TABS
650.0000 mg | ORAL_TABLET | Freq: Once | ORAL | Status: AC
  Administered 2019-03-28: 650 mg via ORAL
  Filled 2019-03-28: qty 2

## 2019-03-28 MED ORDER — PIPERACILLIN-TAZOBACTAM 3.375 G IVPB: 3.3750 g | Freq: Three times a day (TID) | INTRAVENOUS | Status: DC

## 2019-03-28 MED ORDER — IOHEXOL 350 MG/ML SOLN
80.0000 mL | Freq: Once | INTRAVENOUS | Status: AC | PRN
  Administered 2019-03-28: 80 mL via INTRAVENOUS

## 2019-03-28 MED ORDER — PANTOPRAZOLE SODIUM 40 MG PO TBEC
40.0000 mg | DELAYED_RELEASE_TABLET | Freq: Two times a day (BID) | ORAL | Status: DC
  Administered 2019-03-28 – 2019-04-01 (×8): 40 mg via ORAL
  Filled 2019-03-28 (×8): qty 1

## 2019-03-28 MED ORDER — ENOXAPARIN SODIUM 40 MG/0.4ML ~~LOC~~ SOLN: 40.0000 mg | SUBCUTANEOUS | Status: DC

## 2019-03-28 MED ORDER — MAGNESIUM SULFATE 50 % IJ SOLN
3.0000 g | Freq: Once | INTRAVENOUS | Status: AC
  Administered 2019-03-28: 3 g via INTRAVENOUS
  Filled 2019-03-28: qty 6

## 2019-03-28 MED ORDER — POTASSIUM CHLORIDE 10 MEQ/100ML IV SOLN
10.0000 meq | INTRAVENOUS | Status: AC
  Administered 2019-03-28 – 2019-03-29 (×4): 10 meq via INTRAVENOUS
  Filled 2019-03-28 (×4): qty 100

## 2019-03-28 MED ORDER — ACETAMINOPHEN 500 MG PO TABS
1000.0000 mg | ORAL_TABLET | Freq: Once | ORAL | Status: AC
  Administered 2019-03-28: 1000 mg via ORAL
  Filled 2019-03-28: qty 2

## 2019-03-28 MED ORDER — ENOXAPARIN SODIUM 40 MG/0.4ML ~~LOC~~ SOLN
40.0000 mg | SUBCUTANEOUS | Status: DC
  Administered 2019-03-28 – 2019-04-01 (×5): 40 mg via SUBCUTANEOUS
  Filled 2019-03-28 (×5): qty 0.4

## 2019-03-28 MED ORDER — INFLUENZA VAC A&B SA ADJ QUAD 0.5 ML IM PRSY
0.5000 mL | PREFILLED_SYRINGE | INTRAMUSCULAR | Status: DC
  Filled 2019-03-28: qty 0.5

## 2019-03-28 MED ORDER — PIPERACILLIN-TAZOBACTAM 3.375 G IVPB
3.3750 g | Freq: Three times a day (TID) | INTRAVENOUS | Status: DC
  Filled 2019-03-28: qty 50

## 2019-03-28 MED ORDER — SODIUM CHLORIDE 0.9 % IV SOLN
1000.0000 mL | INTRAVENOUS | Status: DC
  Administered 2019-03-28: 1000 mL via INTRAVENOUS

## 2019-03-28 MED ORDER — SODIUM CHLORIDE 0.9 % IV SOLN
1.0000 g | Freq: Once | INTRAVENOUS | Status: AC
  Administered 2019-03-28: 1 g via INTRAVENOUS
  Filled 2019-03-28: qty 10

## 2019-03-28 MED ORDER — SODIUM CHLORIDE 0.9 % IV SOLN
500.0000 mg | Freq: Once | INTRAVENOUS | Status: AC
  Administered 2019-03-28: 500 mg via INTRAVENOUS
  Filled 2019-03-28: qty 500

## 2019-03-28 MED ORDER — SODIUM CHLORIDE 0.9 % IV SOLN: INTRAVENOUS | Status: DC

## 2019-03-28 MED ORDER — MONTELUKAST SODIUM 10 MG PO TABS
10.0000 mg | ORAL_TABLET | Freq: Every day | ORAL | Status: DC
  Administered 2019-03-28 – 2019-04-01 (×4): 10 mg via ORAL
  Filled 2019-03-28 (×4): qty 1

## 2019-03-28 MED ORDER — CLINDAMYCIN PHOSPHATE 600 MG/50ML IV SOLN
600.0000 mg | Freq: Once | INTRAVENOUS | Status: AC
  Administered 2019-03-28: 600 mg via INTRAVENOUS
  Filled 2019-03-28: qty 50

## 2019-03-28 MED ORDER — VANCOMYCIN HCL 1250 MG/250ML IV SOLN
1250.0000 mg | Freq: Once | INTRAVENOUS | Status: AC
  Administered 2019-03-28: 1250 mg via INTRAVENOUS
  Filled 2019-03-28: qty 250

## 2019-03-28 MED ORDER — PIPERACILLIN-TAZOBACTAM 3.375 G IVPB
3.3750 g | Freq: Three times a day (TID) | INTRAVENOUS | Status: DC
  Administered 2019-03-28 – 2019-04-02 (×14): 3.375 g via INTRAVENOUS
  Filled 2019-03-28 (×13): qty 50

## 2019-03-28 MED ORDER — FLUOXETINE HCL 10 MG PO CAPS
10.0000 mg | ORAL_CAPSULE | Freq: Every day | ORAL | Status: DC
  Administered 2019-03-28 – 2019-04-01 (×4): 10 mg via ORAL
  Filled 2019-03-28 (×6): qty 1

## 2019-03-28 MED ORDER — SODIUM CHLORIDE 0.9 % IV SOLN
500.0000 mg | INTRAVENOUS | Status: DC
  Administered 2019-03-29 – 2019-04-02 (×5): 500 mg via INTRAVENOUS
  Filled 2019-03-28 (×6): qty 500

## 2019-03-28 MED ORDER — KETOTIFEN FUMARATE 0.025 % OP SOLN: 1.0000 [drp] | Freq: Every day | OPHTHALMIC | Status: DC | PRN

## 2019-03-28 MED ORDER — BUPROPION HCL ER (XL) 300 MG PO TB24
300.0000 mg | ORAL_TABLET | Freq: Every day | ORAL | Status: DC
  Administered 2019-03-28 – 2019-04-01 (×4): 300 mg via ORAL
  Filled 2019-03-28 (×5): qty 1

## 2019-03-28 MED ORDER — POTASSIUM CHLORIDE CRYS ER 20 MEQ PO TBCR
40.0000 meq | EXTENDED_RELEASE_TABLET | Freq: Once | ORAL | Status: AC
  Administered 2019-03-28: 40 meq via ORAL
  Filled 2019-03-28: qty 2

## 2019-03-28 MED ORDER — VANCOMYCIN HCL IN DEXTROSE 1-5 GM/200ML-% IV SOLN
1000.0000 mg | INTRAVENOUS | Status: DC
  Administered 2019-03-29 – 2019-04-02 (×5): 1000 mg via INTRAVENOUS
  Filled 2019-03-28 (×5): qty 200

## 2019-03-28 MED ORDER — TRAMADOL HCL 50 MG PO TABS
50.0000 mg | ORAL_TABLET | Freq: Two times a day (BID) | ORAL | Status: AC | PRN
  Administered 2019-03-28 – 2019-03-29 (×2): 50 mg via ORAL
  Filled 2019-03-28 (×2): qty 1

## 2019-03-28 MED ORDER — VANCOMYCIN HCL IN DEXTROSE 1-5 GM/200ML-% IV SOLN: 1000.0000 mg | INTRAVENOUS | Status: DC

## 2019-03-28 NOTE — H&P (Addendum)
History and Physical    Brooke Ellis KGM:[id number] DOB: 01-Feb-1950 DOA: 03/28/2019  PCP: Derinda Late, MD   Patient coming from: Home.   I have personally briefly reviewed patient's old medical records in [place]  Chief Complaint: sob, chills, generalized malaise for 2 weeks.   HPI: Brooke Ellis is a 69 y.o. female with medical history significant of  Depression, asthma, hyperlipidemia, impaired glucose tolerance,  GERD, anxiety presents to [facility] for worsening sob, with cough and chills, generalized malaise for over 2 weeks. Pt denies fevers, but has some left sided chest pain on deep breathing, has chills for 2 weeks now. She reports occasional nausea, lack of appetite In addition to the above complaints. She denies vomiting, diarrhea, abd pain, pedal edema dysuria, headache, dizziness, syncope, hematochezia or hematemesis, or dental caries. She also reports having an EGD , colonoscopy about 2 weeks ago prior to these symptoms. She denies having any dental work done recently. She denies any smoking, or family history of lung cancers.   ED Course: on arrival to ED, she was found to be febrile, tachycardic, tachypneic, . Labs revealed sodium of 134, potassium of 2.8, chloride of 88, glucose of 127, AG of 16, magnesium of 1.2, alb of 2.6, CRP of 27.3, lactic acid of 1.9, pro calcitonin of 0.12, wbc of 14.6 and platelets of 594 with left shift, d dimer of 2.19 and fibrinogen of greater than 800.  Respiratory panel is negative.  COVID 19 screening test is negative.   CT angio of the chest shows Heterogeneous lingular consolidation with ill-defined low-density and focus of air suspicious for necrotic process. Differential considerations include necrotic mass versus abscess/pneumonia. Moderate size left pleural effusion which is partially loculated, with fluid tracking into the interlobar fissure. Recommend fluid and/or tissue sampling. Consolidation in the left lower lobe and  dependent left upper lobe likely represents compressive atelectasis related to pleural effusion, however infectious etiology/pneumonia is also considered. Large hiatal hernia with greater than 50% of the stomach intrathoracic. Patulous mid esophagus containing air-fluid level.  Pt referred to medical service for admission.     Review of Systems: As per HPI otherwise "All others reviewed and are negative,"   Past Medical History:  Diagnosis Date  . Allergy    seasonal  . Anemia   . Anxiety   . Asthma   . Depression   . Gallstones   . GERD (gastroesophageal reflux disease)   . Hearing loss   . High cholesterol   . Impaired glucose tolerance   . Internal hemorrhoids   . Osteopenia   . Overweight   . Urinary tract infection   . Vitamin D deficiency     Past Surgical History:  Procedure Laterality Date  . CHOLECYSTECTOMY    . COLONOSCOPY    . FOOT SURGERY    . GALLBLADDER SURGERY    . POLYPECTOMY    . TUBAL LIGATION    . UPPER GASTROINTESTINAL ENDOSCOPY     Social History:    reports that she has never smoked. She has never used smokeless tobacco. She reports that she does not drink alcohol or use drugs.  Allergies  Allergen Reactions  . Macrodantin     Chemical pneumonia   . Morphine And Related     hallucinations   . Sulfa Antibiotics Itching and Swelling    Family History  Problem Relation Age of Onset  . Crohn's disease Daughter   . Diverticulitis Sister   . Colon cancer Neg Hx   .  Gallbladder disease Neg Hx   . Esophageal cancer Neg Hx   . Pancreatic cancer Neg Hx   . Rectal cancer Neg Hx   . Stomach cancer Neg Hx   . Colon polyps Neg Hx    Family history negative for lung cancer.   Prior to Admission medications   Medication Sig Start Date End Date Taking? Authorizing Provider  buPROPion (WELLBUTRIN XL) 300 MG 24 hr tablet Take 300 mg by mouth daily.     Yes [provider]  Cholecalciferol (VITAMIN D) 2000 UNITS tablet Take 2,000 Units  by mouth daily.   Yes [provider]  ezetimibe (ZETIA) 10 MG tablet Take 10 mg by mouth every evening.    Yes [provider]  FLUoxetine (PROZAC) 10 MG capsule Take 10 mg by mouth daily.     Yes [provider]  ketotifen (ZADITOR) 0.025 % ophthalmic solution Place 1 drop into both eyes daily as needed. For seasonal allergies    Yes [provider]  levofloxacin (LEVAQUIN) 500 MG tablet Take 500 mg by mouth daily. 03/26/19  Yes [provider]  metFORMIN (GLUCOPHAGE-XR) 500 MG 24 hr tablet Take 500 mg by mouth daily. Reported on 05/13/2015 12/22/13  Yes [provider]  montelukast (SINGULAIR) 10 MG tablet Take 10 mg by mouth daily.    Yes [provider]  omeprazole (PRILOSEC) 40 MG capsule Take 1 tablet 30 minutes before breakfast and 1 tablet 30 minutes before supper 02/14/14  Yes Irene Shipper, MD  ondansetron (ZOFRAN-ODT) 4 MG disintegrating tablet Take 4 mg by mouth every 8 (eight) hours as needed for nausea or vomiting.  03/26/19  Yes [provider]  rosuvastatin (CRESTOR) 40 MG tablet Take 40 mg by mouth daily.   Yes [provider]    Physical Exam:  Constitutional: NAD, calm, comfortable Vitals:   03/28/19 1430 03/28/19 1440 03/28/19 1500 03/28/19 1702  BP: 107/70  106/68 111/67  Pulse: 95  97 99  Resp: (!) 21  16 14   Temp:  98.9 F (37.2 C)  99.6 F (37.6 C)  TempSrc:  Oral  Oral  SpO2: 95%  96% 97%  Weight:    63.5 kg  Height:    5' (1.524 m)   Eyes: PERRL, lids and conjunctivae normal ENMT: Mucous membranes are dry.  Neck: normal, supple, no masses, no thyromegaly Respiratory: tachypnea, shallow breathing, diminished air entry on the left.  Cardiovascular: Regular rate and rhythm, no murmurs / rubs / gallops. Abdomen: no tenderness, no masses palpated.  Bowel sounds positive.  Musculoskeletal: no clubbing / cyanosis. No joint deformity upper and lower extremities. Good ROM, no contractures.  Normal muscle tone.  Skin: no rashes, lesions, ulcers. No induration Neurologic: CN 2-12 grossly intact. Sensation intact,  Strength 5/5 in all 4.  Psychiatric: Normal judgment and insight. Alert and oriented x 3. Normal mood.     Labs on Admission: I have personally reviewed following labs and imaging studies  CBC: Recent Labs  Lab 03/28/19 1206  WBC 14.6*  NEUTROABS 12.1*  HGB 11.7*  HCT 37.0  MCV 84.5  PLT 188*   Basic Metabolic Panel: Recent Labs  Lab 03/28/19 1206  NA 134*  K 2.8*  CL 88*  CO2 30  GLUCOSE 127*  BUN 8  CREATININE 0.90  CALCIUM 8.5*  MG 1.2*   GFR: Estimated Creatinine Clearance: 49.8 mL/min (by C-G formula based on SCr of 0.9 mg/dL). Liver Function Tests: Recent Labs  Lab 03/28/19  1206  AST 36  ALT 30  ALKPHOS 166*  BILITOT 0.9  PROT 6.6  ALBUMIN 2.6*   No results for input(s): LIPASE, AMYLASE in the last 168 hours. No results for input(s): AMMONIA in the last 168 hours. Coagulation Profile: No results for input(s): INR, PROTIME in the last 168 hours. Cardiac Enzymes: No results for input(s): CKTOTAL, CKMB, CKMBINDEX, TROPONINI in the last 168 hours. BNP (last 3 results) No results for input(s): PROBNP in the last 8760 hours. HbA1C: No results for input(s): HGBA1C in the last 72 hours. CBG: No results for input(s): GLUCAP in the last 168 hours. Lipid Profile: Recent Labs    03/28/19 1206  TRIG 72   Thyroid Function Tests: No results for input(s): TSH, T4TOTAL, FREET4, T3FREE, THYROIDAB in the last 72 hours. Anemia Panel: Recent Labs    03/28/19 1206  FERRITIN 97   Urine analysis: No results found for: COLORURINE, APPEARANCEUR, LABSPEC, PHURINE, GLUCOSEU, HGBUR, BILIRUBINUR, KETONESUR, PROTEINUR, UROBILINOGEN, NITRITE, LEUKOCYTESUR  Radiological Exams on Admission: CT Angio Chest PE W and/or Wo Contrast  Result Date: 03/28/2019 CLINICAL DATA:  PE suspected, high prob. Shortness of breath for 2 weeks. Chest pain. EXAM:  CT ANGIOGRAPHY CHEST WITH CONTRAST TECHNIQUE: Multidetector CT imaging of the chest was performed using the standard protocol during bolus administration of intravenous contrast. Multiplanar CT image reconstructions and MIPs were obtained to evaluate the vascular anatomy. CONTRAST:  47mL OMNIPAQUE IOHEXOL 350 MG/ML SOLN COMPARISON:  Chest radiograph earlier this day demonstrating left pleural effusion. FINDINGS: Cardiovascular: There are no filling defects within the pulmonary arteries to suggest pulmonary embolus. Evaluation is diagnostic to the segmental levels. Cannot assess subsegmental branches due to contrast bolus timing. Thoracic aorta is normal in caliber. No aortic dissection. Heart is normal in size, displaced anteriorly by a large hiatal hernia. No pericardial effusion. Mediastinum/Nodes: Large hiatal hernia, greater than 50% of the stomach is intrathoracic. Mid esophagus is patulous and contains small air-fluid level. Soft tissue density in the left hilum that is subcentimeter, likely represents prominent hilar lymph nodes. There is no right hilar adenopathy. Prominent AP window nodes at 7 and 8 mm. Lobulated fluid/soft tissue density in the anterior mediastinum that is ill-defined and contiguous with loculated pleural fluid, difficult to exclude underlying lymph nodes, for example series 10, images 78 and 90. Lungs/Pleura: Moderate size left pleural effusion is partially loculated. There is lobulated fluid in the inter lobar fissure. Ill-defined lingular consolidation has central low density with focus of air suspicious for necrosis, series 4 and 5 image 44. Consolidation in the left lower lobe and dependent left upper lobe likely represents compressive atelectasis. No right pleural effusion. Compressive atelectasis in the right lower lobe related to hiatal hernia as well as dependently. Upper Abdomen: Post cholecystectomy. No acute findings. Large hiatal hernia with greater than 50% of the stomach  intrathoracic. Musculoskeletal: Chronic T11 compression fracture when compared with lumbar spine MRI 03/23/2018 no acute osseous abnormality or evidence of focal bone lesion. Degenerative change of both shoulders. Review of the MIP images confirms the above findings. IMPRESSION: 1. No pulmonary embolus to the segmental level. 2. Heterogeneous lingular consolidation with ill-defined low-density and focus of air suspicious for necrotic process. Differential considerations include necrotic mass versus abscess/pneumonia. Moderate size left pleural effusion which is partially loculated, with fluid tracking into the interlobar fissure. Recommend fluid and/or tissue sampling. 3. Consolidation in the left lower lobe and dependent left upper lobe likely represents compressive atelectasis related to pleural effusion, however infectious etiology/pneumonia is  also considered. 4. Large hiatal hernia with greater than 50% of the stomach intrathoracic. Patulous mid esophagus containing air-fluid level. 5. Prominent AP window and left hilar nodes are likely reactive. Additional density along the left upper mediastinum felt to be related to loculated pleural fluid rather than enlarged lymph nodes, however recommend attention on follow-up. Electronically Signed   By: Keith Rake M.D.   On: 03/28/2019 13:56   DG Chest Port 1 View  Result Date: 03/28/2019 CLINICAL DATA:  Shortness of breath and chest pain EXAM: PORTABLE CHEST 1 VIEW COMPARISON:  10/26/2010 FINDINGS: Cardiac silhouette is partially obscured but appears mildly enlarged. Moderate left-sided pleural effusion with associated left basilar opacity. Streaky right basilar opacity with probable trace right pleural effusion. No pneumothorax. IMPRESSION: 1. Moderate left-sided pleural effusion with associated left basilar opacity, which may reflect atelectasis and/or pneumonia. 2. Streaky right basilar opacity with probable trace right pleural effusion. Electronically  Signed   By: Davina Poke D.O.   On: 03/28/2019 12:42    EKG: Independently reviewed. Tachycardic, with non specific t wave abnormalities.   Assessment/Plan Active Problems:   Pneumonia    Necrotic lung mass/ lung abscess/ complicated pneumonia with loculated pleural effusion.  Started her on broad spectrum IV antibiotics.  Requiring 2l it of [place] oxygen to keep sats greater than 90%.  PCCM consulted to see if she needs chest tube placement vs thoracentesis, recommended to get Cardiothoracic surgery consult for VATS vs Pigtail catheter placement.  Discussed with Gerhardt , and placed IR consult for pigtail catheter placement.  Discussed the above with the patient and answered her questions.  Blood cultures done and pending. Get strep pneumo and legionella antigens.     Hypokalemia, hypomagnesemia, hyponatremia:  Probably from dehydration.  Replaced and hydrated  Repeat levels in am.     Hyperlipidemia;  Statin on hold.    Asthma:  No wheezing heard.    GERD with Barretts esophagus/ Large Hiatal Hernia:  Resume PPI BID.    Depression:  Resume Fluoxetine.   Severity of Illness: The appropriate patient status for this patient is INPATIENT. Inpatient status is judged to be reasonable and necessary in order to provide the required intensity of service to ensure the patient's safety. The patient's presenting symptoms, physical exam findings, and initial radiographic and laboratory data in the context of their chronic comorbidities is felt to place them at high risk for further clinical deterioration. Furthermore, it is not anticipated that the patient will be medically stable for discharge from the hospital within 2 midnights of admission.  * I certify that at the point of admission it is my clinical judgment that the patient will require inpatient hospital care spanning beyond 2 midnights from the point of admission due to high intensity of service, high risk for further  deterioration and high frequency of surveillance required.*    DVT prophylaxis:lovenox.  Code Status: full code.  Family Communication:  None at bedside.  Disposition Plan: pending further work up.  Consults called: CTVS, IR.  Admission status: inpatient, tele.    Hosie Poisson MD [facility]   If 7PM-7AM, please contact night-coverage [email or web address] Use universal [place] password for that web site. If you do not have the password, please call the hospital operator.  03/28/2019, 5:45 PM    Discussed with Dr Anselm Pancoast IR, Pig TAIL catheter placement will be done tomorrow morning.   Hosie Poisson MD

## 2019-03-28 NOTE — Progress Notes (Signed)
Patient ID: Brooke Ellis, female   DOB: 1950/12/02, 69 y.o.   MRN: [id number] Asked by Dr Karleen Hampshire to review ct on patient admitted tonight with sob and pneumonia symptoms Ct reviewed and discussed with -  Would not do thoracentesis,  Would recommend urgent - tonight or early am placement of ct  directed chest drain on left - resulting fluid  sent for culture and cytology.  Determine quickly if little response to drainage consider VATS soon poss Friday   [name] MD      [address] [place] [address] Office [phone]

## 2019-03-28 NOTE — ED Notes (Signed)
Pt husband, Wille Glaser, updated.

## 2019-03-28 NOTE — Progress Notes (Signed)
Pharmacy Antibiotic Note  Brooke Ellis is a 69 y.o. female admitted on 03/28/2019 with lung abscess.  Pharmacy has been consulted for vancomycin dosing.  Plan: Vancomycin 1250 mg IV loading dose Vancomycin 1000 mg IV Q 24 hrs. Goal AUC 400-550. Expected AUC: 545.9  35.4/14.1 SCr used: 0.9 Vd 0.72 Also on Zosyn 3.375 gm IV q8h per MD F/u renal function, WBC, temp, culture data Vancomycin levels as needed Daily SCr while on V/Z combo   Height: 5' (152.4 cm) Weight: 140 lb (63.5 kg) IBW/kg (Calculated) : 45.5  Temp (24hrs), Avg:99.9 F (37.7 C), Min:98.9 F (37.2 C), Max:101.3 F (38.5 C)  Recent Labs  Lab 03/28/19 1206 03/28/19 1402  WBC 14.6*  --   CREATININE 0.90  --   LATICACIDVEN 1.9 1.1    Estimated Creatinine Clearance: 49.8 mL/min (by C-G formula based on SCr of 0.9 mg/dL).    Allergies  Allergen Reactions  . Macrodantin     Chemical pneumonia   . Morphine And Related     hallucinations   . Sulfa Antibiotics Itching and Swelling    Antimicrobials this admission: 3/10 azith >> 3/10 CTX x 1 3/10 clinda x 1 3/10 vanc>> 3/10 ZEI>> Dose adjustments this admission:  Microbiology results: 3/10 covid/flu neg 3/10 BCx2: sent  Thank you for allowing pharmacy to be a part of this patient's care.  Eudelia Bunch, Pharm.D [phone] 03/28/2019 5:54 PM

## 2019-03-28 NOTE — ED Triage Notes (Signed)
SOB x2 weeks.  Getting worse.  Tested neg for Covid this week.  Left sided Chest Pain with breath.

## 2019-03-28 NOTE — ED Provider Notes (Signed)
[facility] EMERGENCY DEPARTMENT Provider Note   CSN: [id number] Arrival date & time: 03/28/19  1144     History Chief Complaint  Patient presents with   Chest Pain    Brooke Ellis is a 69 y.o. female.  Pt presents to the ED today with sob, fever, and left sided cp. The pt said she's had sx for 2 weeks.  She thought she had Covid, but was swabbed and rapid and pcr tests were negative.  Results came back yesterday.  Pt did take ibuprofen 800 mg this am around 0600.  Pt's O2 sat 83% on RA when she arrived, so she was put on 2L.  She has not had any Covid vaccine.        Past Medical History:  Diagnosis Date   Allergy    seasonal   Anemia    Anxiety    Asthma    Depression    Gallstones    GERD (gastroesophageal reflux disease)    Hearing loss    High cholesterol    Impaired glucose tolerance    Internal hemorrhoids    Osteopenia    Overweight    Urinary tract infection    Vitamin D deficiency     Patient Active Problem List   Diagnosis Date Noted   Pneumonia 03/28/2019   GERD (gastroesophageal reflux disease)    Impaired glucose tolerance    High cholesterol    Anemia     Past Surgical History:  Procedure Laterality Date   CHOLECYSTECTOMY     COLONOSCOPY     FOOT SURGERY     GALLBLADDER SURGERY     POLYPECTOMY     TUBAL LIGATION     UPPER GASTROINTESTINAL ENDOSCOPY       OB History   No obstetric history on file.     Family History  Problem Relation Age of Onset   Crohn's disease Daughter    Diverticulitis Sister    Colon cancer Neg Hx    Gallbladder disease Neg Hx    Esophageal cancer Neg Hx    Pancreatic cancer Neg Hx    Rectal cancer Neg Hx    Stomach cancer Neg Hx    Colon polyps Neg Hx     Social History   Tobacco Use   Smoking status: Never Smoker   Smokeless tobacco: Never Used  Substance Use Topics   Alcohol use: No    Alcohol/week: 0.0 standard drinks   Drug use: No     Home Medications Prior to Admission medications   Medication Sig Start Date End Date Taking? Authorizing Provider  buPROPion (WELLBUTRIN XL) 300 MG 24 hr tablet Take 300 mg by mouth daily.      [provider]  Cholecalciferol (VITAMIN D) 2000 UNITS tablet Take 2,000 Units by mouth daily.    [provider]  ezetimibe (ZETIA) 10 MG tablet Take 10 mg by mouth daily.    [provider]  FLUoxetine (PROZAC) 10 MG capsule Take 10 mg by mouth daily.      [provider]  Homeopathic Products [facility] COLD REMEDY PO) Take 1 tablet by mouth. Gummy for zinc supplement    [provider]  ketotifen (ZADITOR) 0.025 % ophthalmic solution Place 1 drop into both eyes daily as needed. For seasonal allergies     [provider]  metFORMIN (GLUCOPHAGE-XR) 500 MG 24 hr tablet Take 500 mg by mouth daily. Reported on 05/13/2015 12/22/13   [provider]  montelukast (SINGULAIR)  10 MG tablet Take 10 mg by mouth at bedtime.      [provider]  Multiple Vitamin (MULTIVITAMIN) tablet Take 1 tablet by mouth daily.      [provider]  omeprazole (PRILOSEC) 40 MG capsule Take 1 tablet 30 minutes before breakfast and 1 tablet 30 minutes before supper 02/14/14   Irene Shipper, MD  rosuvastatin (CRESTOR) 40 MG tablet Take 40 mg by mouth daily.    [provider]  vitamin C (ASCORBIC ACID) 250 MG tablet Take 250 mg by mouth daily.    [provider]    Allergies    Macrodantin, Morphine and related, and Sulfa antibiotics  Review of Systems   Review of Systems  Constitutional: Positive for fever.  Respiratory: Positive for shortness of breath.   Cardiovascular: Positive for chest pain.  All other systems reviewed and are negative.   Physical Exam Updated Vital Signs BP 107/70    Pulse 95    Temp 98.9 F (37.2 C) (Oral)    Resp (!) 21    Ht 5' (1.524 m)    Wt 66 kg    SpO2 95%    BMI 28.44 kg/m   Physical  Exam Vitals and nursing note reviewed.  Constitutional:      Appearance: She is well-developed.  HENT:     Head: Normocephalic and atraumatic.  Eyes:     Extraocular Movements: Extraocular movements intact.     Pupils: Pupils are equal, round, and reactive to light.  Cardiovascular:     Rate and Rhythm: Regular rhythm. Tachycardia present.     Heart sounds: Normal heart sounds.  Pulmonary:     Effort: Pulmonary effort is normal.     Breath sounds: Normal breath sounds.  Abdominal:     General: Bowel sounds are normal.     Palpations: Abdomen is soft.  Musculoskeletal:        General: Normal range of motion.     Cervical back: Normal range of motion and neck supple.  Skin:    General: Skin is warm and dry.     Capillary Refill: Capillary refill takes less than 2 seconds.  Neurological:     General: No focal deficit present.     Mental Status: She is alert and oriented to person, place, and time.  Psychiatric:        Mood and Affect: Mood normal.        Behavior: Behavior normal.     ED Results / Procedures / Treatments   Labs (all labs ordered are listed, but only abnormal results are displayed) Labs Reviewed  CBC WITH DIFFERENTIAL/PLATELET - Abnormal; Notable for the following components:      Result Value   WBC 14.6 (*)    Hemoglobin 11.7 (*)    Platelets 594 (*)    Neutro Abs 12.1 (*)    Monocytes Absolute 1.4 (*)    Abs Immature Granulocytes 0.15 (*)    All other components within normal limits  COMPREHENSIVE METABOLIC PANEL - Abnormal; Notable for the following components:   Sodium 134 (*)    Potassium 2.8 (*)    Chloride 88 (*)    Glucose, Bld 127 (*)    Calcium 8.5 (*)    Albumin 2.6 (*)    Alkaline Phosphatase 166 (*)    Anion gap 16 (*)    All other components within normal limits  D-DIMER, QUANTITATIVE (NOT AT [facility]) - Abnormal; Notable for the following components:  D-Dimer, Quant 2.19 (*)    All other components within normal limits  SARS  CORONAVIRUS 2 AG (30 MIN TAT)  RESPIRATORY PANEL BY RT PCR (FLU A&B, COVID)  CULTURE, BLOOD (ROUTINE X 2)  CULTURE, BLOOD (ROUTINE X 2)  LACTIC ACID, PLASMA  LACTIC ACID, PLASMA  LACTATE DEHYDROGENASE  TRIGLYCERIDES  PROCALCITONIN  FERRITIN  FIBRINOGEN  C-REACTIVE PROTEIN  MAGNESIUM  HIV ANTIBODY (ROUTINE TESTING W REFLEX)  CBC  CREATININE, SERUM    EKG EKG Interpretation  Date/Time:  Wednesday March 28 2019 11:56:56 EST Ventricular Rate:  110 PR Interval:    QRS Duration: 90 QT Interval:  348 QTC Calculation: 471 R Axis:   106 Text Interpretation: Sinus tachycardia Right axis deviation Low voltage, precordial leads RSR' in V1 or V2, probably normal variant Borderline T abnormalities, diffuse leads No old tracing to compare Confirmed by Isla Pence [phone]) on 03/28/2019 12:09:31 PM   Radiology CT Angio Chest PE W and/or Wo Contrast  Result Date: 03/28/2019 CLINICAL DATA:  PE suspected, high prob. Shortness of breath for 2 weeks. Chest pain. EXAM: CT ANGIOGRAPHY CHEST WITH CONTRAST TECHNIQUE: Multidetector CT imaging of the chest was performed using the standard protocol during bolus administration of intravenous contrast. Multiplanar CT image reconstructions and MIPs were obtained to evaluate the vascular anatomy. CONTRAST:  3mL OMNIPAQUE IOHEXOL 350 MG/ML SOLN COMPARISON:  Chest radiograph earlier this day demonstrating left pleural effusion. FINDINGS: Cardiovascular: There are no filling defects within the pulmonary arteries to suggest pulmonary embolus. Evaluation is diagnostic to the segmental levels. Cannot assess subsegmental branches due to contrast bolus timing. Thoracic aorta is normal in caliber. No aortic dissection. Heart is normal in size, displaced anteriorly by a large hiatal hernia. No pericardial effusion. Mediastinum/Nodes: Large hiatal hernia, greater than 50% of the stomach is intrathoracic. Mid esophagus is patulous and contains small air-fluid level. Soft  tissue density in the left hilum that is subcentimeter, likely represents prominent hilar lymph nodes. There is no right hilar adenopathy. Prominent AP window nodes at 7 and 8 mm. Lobulated fluid/soft tissue density in the anterior mediastinum that is ill-defined and contiguous with loculated pleural fluid, difficult to exclude underlying lymph nodes, for example series 10, images 78 and 90. Lungs/Pleura: Moderate size left pleural effusion is partially loculated. There is lobulated fluid in the inter lobar fissure. Ill-defined lingular consolidation has central low density with focus of air suspicious for necrosis, series 4 and 5 image 44. Consolidation in the left lower lobe and dependent left upper lobe likely represents compressive atelectasis. No right pleural effusion. Compressive atelectasis in the right lower lobe related to hiatal hernia as well as dependently. Upper Abdomen: Post cholecystectomy. No acute findings. Large hiatal hernia with greater than 50% of the stomach intrathoracic. Musculoskeletal: Chronic T11 compression fracture when compared with lumbar spine MRI 03/23/2018 no acute osseous abnormality or evidence of focal bone lesion. Degenerative change of both shoulders. Review of the MIP images confirms the above findings. IMPRESSION: 1. No pulmonary embolus to the segmental level. 2. Heterogeneous lingular consolidation with ill-defined low-density and focus of air suspicious for necrotic process. Differential considerations include necrotic mass versus abscess/pneumonia. Moderate size left pleural effusion which is partially loculated, with fluid tracking into the interlobar fissure. Recommend fluid and/or tissue sampling. 3. Consolidation in the left lower lobe and dependent left upper lobe likely represents compressive atelectasis related to pleural effusion, however infectious etiology/pneumonia is also considered. 4. Large hiatal hernia with greater than 50% of the stomach intrathoracic.  Patulous  mid esophagus containing air-fluid level. 5. Prominent AP window and left hilar nodes are likely reactive. Additional density along the left upper mediastinum felt to be related to loculated pleural fluid rather than enlarged lymph nodes, however recommend attention on follow-up. Electronically Signed   By: Keith Rake M.D.   On: 03/28/2019 13:56   DG Chest Port 1 View  Result Date: 03/28/2019 CLINICAL DATA:  Shortness of breath and chest pain EXAM: PORTABLE CHEST 1 VIEW COMPARISON:  10/26/2010 FINDINGS: Cardiac silhouette is partially obscured but appears mildly enlarged. Moderate left-sided pleural effusion with associated left basilar opacity. Streaky right basilar opacity with probable trace right pleural effusion. No pneumothorax. IMPRESSION: 1. Moderate left-sided pleural effusion with associated left basilar opacity, which may reflect atelectasis and/or pneumonia. 2. Streaky right basilar opacity with probable trace right pleural effusion. Electronically Signed   By: Davina Poke D.O.   On: 03/28/2019 12:42    Procedures Procedures (including critical care time)  Medications Ordered in ED Medications  0.9 %  sodium chloride infusion (1,000 mLs Intravenous New Bag/Given 03/28/19 1217)  clindamycin (CLEOCIN) IVPB 600 mg (600 mg Intravenous New Bag/Given 03/28/19 1439)  enoxaparin (LOVENOX) injection 40 mg (has no administration in time range)  acetaminophen (TYLENOL) tablet 1,000 mg (1,000 mg Oral Given 03/28/19 1209)  cefTRIAXone (ROCEPHIN) 1 g in sodium chloride 0.9 % 100 mL IVPB (0 g Intravenous Stopped 03/28/19 1437)  azithromycin (ZITHROMAX) 500 mg in sodium chloride 0.9 % 250 mL IVPB (500 mg Intravenous New Bag/Given 03/28/19 1316)  potassium chloride SA (KLOR-CON) CR tablet 40 mEq (40 mEq Oral Given 03/28/19 1308)  iohexol (OMNIPAQUE) 350 MG/ML injection 80 mL (80 mLs Intravenous Contrast Given 03/28/19 1326)    ED Course  I have reviewed the triage vital signs and the  nursing notes.  Pertinent labs & imaging results that were available during my care of the patient were reviewed by me and considered in my medical decision making (see chart for details).    MDM Rules/Calculators/A&P                      Pt placed on 2L and is feeling much less sob.  O2 sats mid-90s.  Pt given rocephin and zithromax for PNA.  CT came back showing necrotic mass vs abscess vs pneumonia.  Clindamycin IV added for more gram negative coverage.  Covid PCR did come back negative.  K was low at 2.8.  Pt given 40 meq po.  Mg lab added on.  Pt was d/w Dr. Rodena Piety for admission.  CRITICAL CARE Performed by: Isla Pence   Total critical care time: 30 minutes  Critical care time was exclusive of separately billable procedures and treating other patients.  Critical care was necessary to treat or prevent imminent or life-threatening deterioration.  Critical care was time spent personally by me on the following activities: development of treatment plan with patient and/or surrogate as well as nursing, discussions with consultants, evaluation of patient's response to treatment, examination of patient, obtaining history from patient or surrogate, ordering and performing treatments and interventions, ordering and review of laboratory studies, ordering and review of radiographic studies, pulse oximetry and re-evaluation of patient's condition.  Jolyne Laye was evaluated in Emergency Department on 03/28/2019 for the symptoms described in the history of present illness. She was evaluated in the context of the global COVID-19 pandemic, which necessitated consideration that the patient might be at risk for infection with the SARS-CoV-2 virus that causes COVID-19. Institutional  protocols and algorithms that pertain to the evaluation of patients at risk for COVID-19 are in a state of rapid change based on information released by regulatory bodies including the CDC and federal and state  organizations. These policies and algorithms were followed during the patient's care in the ED. Final Clinical Impression(s) / ED Diagnoses Final diagnoses:  Acute respiratory failure with hypoxia ([place])  Pneumonia of left lower lobe due to infectious organism  Hypokalemia  Pleural effusion, left    Rx / DC Orders ED Discharge Orders    None       Isla Pence, MD 03/28/19 1450

## 2019-03-28 NOTE — Plan of Care (Signed)
Patients vitals signs will remain stable, and have adequate oxygenation.

## 2019-03-28 NOTE — Plan of Care (Signed)
69 year old female admitted with fever shortness of breath and cough for several days.  Covid negative.  However CT of the chest shows possible pneumonia/abscess/necrotic mass saturation was 83% on room air.  She was placed on 2 L with improvement in saturation to the mid 90s.  She is being admitted for IV antibiotics.

## 2019-03-29 ENCOUNTER — Inpatient Hospital Stay (HOSPITAL_COMMUNITY): Payer: Medicare Other

## 2019-03-29 ENCOUNTER — Encounter (HOSPITAL_COMMUNITY): Payer: Self-pay | Admitting: Internal Medicine

## 2019-03-29 DIAGNOSIS — D75839 Thrombocytosis, unspecified: Secondary | ICD-10-CM | POA: Diagnosis present

## 2019-03-29 DIAGNOSIS — D473 Essential (hemorrhagic) thrombocythemia: Secondary | ICD-10-CM | POA: Diagnosis present

## 2019-03-29 DIAGNOSIS — K219 Gastro-esophageal reflux disease without esophagitis: Secondary | ICD-10-CM

## 2019-03-29 DIAGNOSIS — E876 Hypokalemia: Secondary | ICD-10-CM | POA: Diagnosis present

## 2019-03-29 LAB — CBC
HCT: 34 % — ABNORMAL LOW (ref 36.0–46.0)
Hemoglobin: 10.7 g/dL — ABNORMAL LOW (ref 12.0–15.0)
MCH: 26.6 pg (ref 26.0–34.0)
MCHC: 31.5 g/dL (ref 30.0–36.0)
MCV: 84.6 fL (ref 80.0–100.0)
Platelets: 609 10*3/uL — ABNORMAL HIGH (ref 150–400)
RBC: 4.02 MIL/uL (ref 3.87–5.11)
RDW: 13.2 % (ref 11.5–15.5)
WBC: 22.5 10*3/uL — ABNORMAL HIGH (ref 4.0–10.5)
nRBC: 0 % (ref 0.0–0.2)

## 2019-03-29 LAB — COMPREHENSIVE METABOLIC PANEL
ALT: 24 U/L (ref 0–44)
AST: 27 U/L (ref 15–41)
Albumin: 2.3 g/dL — ABNORMAL LOW (ref 3.5–5.0)
Alkaline Phosphatase: 139 U/L — ABNORMAL HIGH (ref 38–126)
Anion gap: 9 (ref 5–15)
BUN: 6 mg/dL — ABNORMAL LOW (ref 8–23)
CO2: 30 mmol/L (ref 22–32)
Calcium: 8.1 mg/dL — ABNORMAL LOW (ref 8.9–10.3)
Chloride: 95 mmol/L — ABNORMAL LOW (ref 98–111)
Creatinine, Ser: 0.73 mg/dL (ref 0.44–1.00)
GFR calc Af Amer: >60 mL/min (ref >60)
GFR calc non Af Amer: >60 mL/min (ref >60)
Glucose, Bld: 125 mg/dL — ABNORMAL HIGH (ref 70–99)
Potassium: 3.3 mmol/L — ABNORMAL LOW (ref 3.5–5.1)
Sodium: 134 mmol/L — ABNORMAL LOW (ref 135–145)
Total Bilirubin: 0.6 mg/dL (ref 0.3–1.2)
Total Protein: 6.3 g/dL — ABNORMAL LOW (ref 6.5–8.1)

## 2019-03-29 LAB — GRAM STAIN

## 2019-03-29 LAB — BODY FLUID CELL COUNT WITH DIFFERENTIAL
Eos, Fluid: 1 %
Lymphs, Fluid: 4 %
Monocyte-Macrophage-Serous Fluid: 6 % — ABNORMAL LOW (ref 50–90)
Neutrophil Count, Fluid: 89 % — ABNORMAL HIGH (ref 0–25)
Total Nucleated Cell Count, Fluid: 6382 cu mm — ABNORMAL HIGH (ref 0–1000)

## 2019-03-29 LAB — LACTATE DEHYDROGENASE, PLEURAL OR PERITONEAL FLUID: LD, Fluid: 1431 U/L — ABNORMAL HIGH (ref 3–23)

## 2019-03-29 LAB — PROTEIN, PLEURAL OR PERITONEAL FLUID: Total protein, fluid: 4.7 g/dL

## 2019-03-29 LAB — MAGNESIUM: Magnesium: 2.2 mg/dL (ref 1.7–2.4)

## 2019-03-29 LAB — STREP PNEUMONIAE URINARY ANTIGEN: Strep Pneumo Urinary Antigen: NEGATIVE

## 2019-03-29 LAB — PROTIME-INR
INR: 1.3 — ABNORMAL HIGH (ref 0.8–1.2)
Prothrombin Time: 15.7 seconds — ABNORMAL HIGH (ref 11.4–15.2)

## 2019-03-29 MED ORDER — FENTANYL CITRATE (PF) 100 MCG/2ML IJ SOLN
INTRAMUSCULAR | Status: AC | PRN
  Administered 2019-03-29 (×2): 50 ug via INTRAVENOUS

## 2019-03-29 MED ORDER — NALOXONE HCL 0.4 MG/ML IJ SOLN
INTRAMUSCULAR | Status: AC
  Filled 2019-03-29: qty 1

## 2019-03-29 MED ORDER — FLUMAZENIL 0.5 MG/5ML IV SOLN
INTRAVENOUS | Status: AC
  Filled 2019-03-29: qty 5

## 2019-03-29 MED ORDER — SODIUM CHLORIDE 0.9 % IV SOLN
INTRAVENOUS | Status: DC | PRN
  Administered 2019-03-29: 250 mL via INTRAVENOUS

## 2019-03-29 MED ORDER — MIDAZOLAM HCL 2 MG/2ML IJ SOLN
INTRAMUSCULAR | Status: AC
  Filled 2019-03-29: qty 4

## 2019-03-29 MED ORDER — POTASSIUM CHLORIDE 10 MEQ/100ML IV SOLN
10.0000 meq | INTRAVENOUS | Status: AC
  Administered 2019-03-29 (×2): 10 meq via INTRAVENOUS
  Filled 2019-03-29 (×2): qty 100

## 2019-03-29 MED ORDER — MIDAZOLAM HCL 2 MG/2ML IJ SOLN
INTRAMUSCULAR | Status: AC | PRN
  Administered 2019-03-29 (×4): 1 mg via INTRAVENOUS

## 2019-03-29 MED ORDER — FENTANYL CITRATE (PF) 100 MCG/2ML IJ SOLN
INTRAMUSCULAR | Status: AC
  Filled 2019-03-29: qty 2

## 2019-03-29 MED ORDER — ONDANSETRON HCL 4 MG/2ML IJ SOLN: 4.0000 mg | Freq: Four times a day (QID) | INTRAMUSCULAR | Status: DC | PRN

## 2019-03-29 MED ORDER — LIDOCAINE HCL (PF) 1 % IJ SOLN
INTRAMUSCULAR | Status: AC | PRN
  Administered 2019-03-29: 10 mL via INTRADERMAL

## 2019-03-29 NOTE — Progress Notes (Signed)
Patient feeling nauseated and dry heaving. Will continue to monitor. Given mouth swabs as patient states her mount feels dry. Safety check completed and call light placed within reach.

## 2019-03-29 NOTE — Progress Notes (Signed)
Patients temperature at this point is 101.9. Provider made aware at this point . Patient now as a Yellow MEWS. Will give tylenol. Safety checks completed and call light placed within reach. Will continue to monitor.

## 2019-03-29 NOTE — Evaluation (Addendum)
Physical Therapy Evaluation Patient Details Name: Brooke Ellis MRN: [id number] DOB: 1950-12-24 Today's Date: 03/29/2019   History of Present Illness  69 y.o. female with medical history significant of  Depression, asthma, hyperlipidemia, impaired glucose tolerance,  GERD, anxiety presents to [facility] for worsening sob, with cough and chills, generalized malaise for over 2 weeks. Dx of PNA, pleural effusion, ? necrotic lung mass, chest tube to be placed 03/29/19.  Clinical Impression  Pt admitted with above diagnosis. Pt ambulated 24' without an assistive device, no loss of balance. SaO2 85% on room air with ambulation, 2/4 dyspnea, pt reports L sided upper back pain with inspiration. Activity tolerance limited by pain, SOB, and, per pt,  anxiety related to upcoming chest tube placement later today. Encouraged mobility and sitting up in recliner daily. Will assess O2 titration with ambulation next session.  Pt currently with functional limitations due to the deficits listed below (see PT Problem List). Pt will benefit from skilled PT to increase their independence and safety with mobility to allow discharge to the venue listed below.       Follow Up Recommendations No PT follow up    Equipment Recommendations  Other (comment);3in1 (PT)(rollator)    Recommendations for Other Services       Precautions / Restrictions Precautions Precautions: Other (comment) Precaution Comments: monitor O2; pt denies h/o falls in past 1 year Restrictions Weight Bearing Restrictions: No      Mobility  Bed Mobility Overal bed mobility: Needs Assistance Bed Mobility: Supine to Sit;Sit to Supine     Supine to sit: Min assist;HOB elevated Sit to supine: Modified independent (Device/Increase time);HOB elevated   General bed mobility comments: min A to raise trunk, used rail  Transfers Overall transfer level: Needs assistance Equipment used: None Transfers: Sit to/from Stand Sit to Stand: Supervision          General transfer comment: supervision for safety, no LOB  Ambulation/Gait Ambulation/Gait assistance: Supervision Gait Distance (Feet): 24 Feet Assistive device: None Gait Pattern/deviations: Step-through pattern;Decreased stride length Gait velocity: WFL   General Gait Details: pt ambulated from bed to bathroom, then back to bed, no loss of balance, distance limited by pain and anxiety about chest tube placement later today, pain medication requested, SaO2 85% on room air with walking, applied 2L O2 [place]  Stairs            Wheelchair Mobility    Modified Rankin (Stroke Patients Only)       Balance Overall balance assessment: Independent                                           Pertinent Vitals/Pain Pain Assessment: 0-10 Pain Score: 6  Pain Location: L posterior thoracic area with breathing Pain Descriptors / Indicators: Sharp Pain Intervention(s): Limited activity within patient's tolerance;Monitored during session;Patient requesting pain meds-RN notified;Repositioned    Home Living Family/patient expects to be discharged to:: Private residence Living Arrangements: Spouse/significant other   Type of Home: House Home Access: Level entry     Home Layout: Two level;Bed/bath upstairs   Additional Comments: works as Environmental health practitioner at [employer]    Prior Function Level of Independence: Independent               Journalist, newspaper        Extremity/Trunk Assessment   Upper Extremity Assessment Upper Extremity Assessment: Overall WFL for tasks assessed  Lower Extremity Assessment Lower Extremity Assessment: Overall WFL for tasks assessed    Cervical / Trunk Assessment Cervical / Trunk Assessment: Normal  Communication   Communication: No difficulties  Cognition Arousal/Alertness: Awake/alert Behavior During Therapy: Anxious(anxious about chest tube placement later today) Overall Cognitive Status: Within Functional Limits  for tasks assessed                                        General Comments      Exercises     Assessment/Plan    PT Assessment Patient needs continued PT services  PT Problem List Decreased activity tolerance;Cardiopulmonary status limiting activity       PT Treatment Interventions Gait training;Therapeutic exercise;Patient/family education    PT Goals (Current goals can be found in the Care Plan section)  Acute Rehab PT Goals Patient Stated Goal: decrease pain PT Goal Formulation: With patient Time For Goal Achievement: 04/12/19 Potential to Achieve Goals: Good    Frequency Min 3X/week   Barriers to discharge        Co-evaluation               AM-PAC PT "6 Clicks" Mobility  Outcome Measure Help needed turning from your back to your side while in a flat bed without using bedrails?: A Little Help needed moving from lying on your back to sitting on the side of a flat bed without using bedrails?: A Lot Help needed moving to and from a bed to a chair (including a wheelchair)?: A Little Help needed standing up from a chair using your arms (e.g., wheelchair or bedside chair)?: None Help needed to walk in hospital room?: None Help needed climbing 3-5 steps with a railing? : A Little 6 Click Score: 19    End of Session Equipment Utilized During Treatment: Oxygen Activity Tolerance: Patient limited by fatigue;Patient limited by pain Patient left: in bed;with call bell/phone within reach Nurse Communication: Mobility status;Patient requests pain meds PT Visit Diagnosis: Difficulty in walking, not elsewhere classified (R26.2);Pain    Time: 1552-0802 PT Time Calculation (min) (ACUTE ONLY): 12 min   Charges:   PT Evaluation $PT Eval Low Complexity: 1 Low          Blondell Reveal Kistler PT 03/29/2019  Acute Rehabilitation Services Pager [phone] Office [phone]

## 2019-03-29 NOTE — Consult Note (Signed)
Chief Complaint: Patient was seen in consultation today for left empyema/left chest tube placement.  Referring Physician(s): Hosie Poisson  Supervising Physician: Aletta Edouard  Patient Status: [facility] - In-pt  History of Present Illness: Brooke Ellis is a 69 y.o. female with a past medical history of high cholesterol, asthma, GERD, internal hemorrhoids, cholelithiasis, anemia, vitamin D deficiency, osteopenia, anxiety, and depression. She presented to [facility] ED 03/28/2019 with complaint of chest pain. In ED, CT chest revealed a necrotic mass vs abscess vs pneumonia. She was transferred and admitted to [facility] for further management. CT surgery was consulted who recommended IR consultation for possible left chest tube placement.  CT chest 03/28/2019: 1. No pulmonary embolus to the segmental level. 2. Heterogeneous lingular consolidation with ill-defined low-density and focus of air suspicious for necrotic process. Differential considerations include necrotic mass versus abscess/pneumonia. Moderate size left pleural effusion which is partially loculated, with fluid tracking into the interlobar fissure. Recommend fluid and/or tissue sampling. 3. Consolidation in the left lower lobe and dependent left upper lobe likely represents compressive atelectasis related to pleural effusion, however infectious etiology/pneumonia is also considered. 4. Large hiatal hernia with greater than 50% of the stomach intrathoracic. Patulous mid esophagus containing air-fluid level. 5. Prominent AP window and left hilar nodes are likely reactive. Additional density along the left upper mediastinum felt to be related to loculated pleural fluid rather than enlarged lymph nodes, however recommend attention on follow-up.  IR requested by Dr. Karleen Hampshire for possible image-guided left chest tube placement. Patient awake and alert sitting in bed. Complains of chest pain when taking a deep breath, stable since  admission. Complains of dyspnea, stable since admission. Denies fever, chills, abdominal pain, or headache.   Past Medical History:  Diagnosis Date  . Allergy    seasonal  . Anemia   . Anxiety   . Asthma   . Depression   . Gallstones   . GERD (gastroesophageal reflux disease)   . Hearing loss   . High cholesterol   . Impaired glucose tolerance   . Internal hemorrhoids   . Osteopenia   . Overweight   . Urinary tract infection   . Vitamin D deficiency     Past Surgical History:  Procedure Laterality Date  . CHOLECYSTECTOMY    . COLONOSCOPY    . FOOT SURGERY    . GALLBLADDER SURGERY    . POLYPECTOMY    . TUBAL LIGATION    . UPPER GASTROINTESTINAL ENDOSCOPY      Allergies: Macrodantin, Morphine and related, and Sulfa antibiotics  Medications: Prior to Admission medications   Medication Sig Start Date End Date Taking? Authorizing Provider  buPROPion (WELLBUTRIN XL) 300 MG 24 hr tablet Take 300 mg by mouth daily.     Yes [provider]  Cholecalciferol (VITAMIN D) 2000 UNITS tablet Take 2,000 Units by mouth daily.   Yes [provider]  ezetimibe (ZETIA) 10 MG tablet Take 10 mg by mouth every evening.    Yes [provider]  FLUoxetine (PROZAC) 10 MG capsule Take 10 mg by mouth daily.     Yes [provider]  ketotifen (ZADITOR) 0.025 % ophthalmic solution Place 1 drop into both eyes daily as needed. For seasonal allergies    Yes [provider]  levofloxacin (LEVAQUIN) 500 MG tablet Take 500 mg by mouth daily. 03/26/19  Yes [provider]  metFORMIN (GLUCOPHAGE-XR) 500 MG 24 hr tablet Take 500 mg by mouth daily. Reported on 05/13/2015 12/22/13  Yes [provider]  montelukast (SINGULAIR) 10 MG tablet Take 10 mg by mouth daily.    Yes [provider]  omeprazole (PRILOSEC) 40 MG capsule Take 1 tablet 30 minutes before breakfast and 1 tablet 30 minutes before supper 02/14/14  Yes Irene Shipper, MD    ondansetron (ZOFRAN-ODT) 4 MG disintegrating tablet Take 4 mg by mouth every 8 (eight) hours as needed for nausea or vomiting.  03/26/19  Yes [provider]  rosuvastatin (CRESTOR) 40 MG tablet Take 40 mg by mouth daily.   Yes [provider]     Family History  Problem Relation Age of Onset  . Crohn's disease Daughter   . Diverticulitis Sister   . Colon cancer Neg Hx   . Gallbladder disease Neg Hx   . Esophageal cancer Neg Hx   . Pancreatic cancer Neg Hx   . Rectal cancer Neg Hx   . Stomach cancer Neg Hx   . Colon polyps Neg Hx     Social History   Socioeconomic History  . Marital status: Single    Spouse name: Not on file  . Number of children: 3  . Years of education: Not on file  . Highest education level: Not on file  Occupational History  . Occupation: TEFL teacher  Tobacco Use  . Smoking status: Never Smoker  . Smokeless tobacco: Never Used  Substance and Sexual Activity  . Alcohol use: No    Alcohol/week: 0.0 standard drinks  . Drug use: No  . Sexual activity: Not on file  Other Topics Concern  . Not on file  Social History Narrative  . Not on file   Social Determinants of Health   Financial Resource Strain:   . Difficulty of Paying Living Expenses:   Food Insecurity:   . Worried About Charity fundraiser in the Last Year:   . Arboriculturist in the Last Year:   Transportation Needs:   . Film/video editor (Medical):   [name] Kitchen Lack of Transportation (Non-Medical):   Physical Activity:   . Days of Exercise per Week:   . Minutes of Exercise per Session:   Stress:   . Feeling of Stress :   Social Connections:   . Frequency of Communication with Friends and Family:   . Frequency of Social Gatherings with Friends and Family:   . Attends Religious Services:   . Active Member of Clubs or Organizations:   . Attends Archivist Meetings:   [name] Kitchen Marital Status:      Review of Systems: A 12 point ROS discussed and  pertinent positives are indicated in the HPI above.  All other systems are negative.  Review of Systems  Constitutional: Negative for chills and fever.  Respiratory: Positive for shortness of breath. Negative for wheezing.   Cardiovascular: Positive for chest pain. Negative for palpitations.  Gastrointestinal: Negative for abdominal pain.  Neurological: Negative for headaches.  Psychiatric/Behavioral: Negative for behavioral problems and confusion.    Vital Signs: BP 124/79 (BP Location: Right Arm)   Pulse (!) 101   Temp (!) 100.4 F (38 C) (Oral)   Resp 19   Ht 5' (1.524 m)   Wt 140 lb (63.5 kg)   SpO2 91%   BMI 27.34 kg/m   Physical Exam Vitals and nursing note reviewed.  Constitutional:      General: She is not in acute distress.    Appearance: Normal appearance.  Cardiovascular:     Rate  and Rhythm: Normal rate and regular rhythm.     Heart sounds: Normal heart sounds. No murmur.  Pulmonary:     Effort: Pulmonary effort is normal. No respiratory distress.     Breath sounds: Normal breath sounds. No wheezing.  Skin:    General: Skin is warm and dry.  Neurological:     Mental Status: She is alert and oriented to person, place, and time.  Psychiatric:        Mood and Affect: Mood normal.        Behavior: Behavior normal.      MD Evaluation Airway: WNL Heart: WNL Abdomen: WNL Chest/ Lungs: WNL ASA  Classification: 2 Mallampati/Airway Score: Two   Imaging: CT Angio Chest PE W and/or Wo Contrast  Result Date: 03/28/2019 CLINICAL DATA:  PE suspected, high prob. Shortness of breath for 2 weeks. Chest pain. EXAM: CT ANGIOGRAPHY CHEST WITH CONTRAST TECHNIQUE: Multidetector CT imaging of the chest was performed using the standard protocol during bolus administration of intravenous contrast. Multiplanar CT image reconstructions and MIPs were obtained to evaluate the vascular anatomy. CONTRAST:  2mL OMNIPAQUE IOHEXOL 350 MG/ML SOLN COMPARISON:  Chest radiograph  earlier this day demonstrating left pleural effusion. FINDINGS: Cardiovascular: There are no filling defects within the pulmonary arteries to suggest pulmonary embolus. Evaluation is diagnostic to the segmental levels. Cannot assess subsegmental branches due to contrast bolus timing. Thoracic aorta is normal in caliber. No aortic dissection. Heart is normal in size, displaced anteriorly by a large hiatal hernia. No pericardial effusion. Mediastinum/Nodes: Large hiatal hernia, greater than 50% of the stomach is intrathoracic. Mid esophagus is patulous and contains small air-fluid level. Soft tissue density in the left hilum that is subcentimeter, likely represents prominent hilar lymph nodes. There is no right hilar adenopathy. Prominent AP window nodes at 7 and 8 mm. Lobulated fluid/soft tissue density in the anterior mediastinum that is ill-defined and contiguous with loculated pleural fluid, difficult to exclude underlying lymph nodes, for example series 10, images 78 and 90. Lungs/Pleura: Moderate size left pleural effusion is partially loculated. There is lobulated fluid in the inter lobar fissure. Ill-defined lingular consolidation has central low density with focus of air suspicious for necrosis, series 4 and 5 image 44. Consolidation in the left lower lobe and dependent left upper lobe likely represents compressive atelectasis. No right pleural effusion. Compressive atelectasis in the right lower lobe related to hiatal hernia as well as dependently. Upper Abdomen: Post cholecystectomy. No acute findings. Large hiatal hernia with greater than 50% of the stomach intrathoracic. Musculoskeletal: Chronic T11 compression fracture when compared with lumbar spine MRI 03/23/2018 no acute osseous abnormality or evidence of focal bone lesion. Degenerative change of both shoulders. Review of the MIP images confirms the above findings. IMPRESSION: 1. No pulmonary embolus to the segmental level. 2. Heterogeneous lingular  consolidation with ill-defined low-density and focus of air suspicious for necrotic process. Differential considerations include necrotic mass versus abscess/pneumonia. Moderate size left pleural effusion which is partially loculated, with fluid tracking into the interlobar fissure. Recommend fluid and/or tissue sampling. 3. Consolidation in the left lower lobe and dependent left upper lobe likely represents compressive atelectasis related to pleural effusion, however infectious etiology/pneumonia is also considered. 4. Large hiatal hernia with greater than 50% of the stomach intrathoracic. Patulous mid esophagus containing air-fluid level. 5. Prominent AP window and left hilar nodes are likely reactive. Additional density along the left upper mediastinum felt to be related to loculated pleural fluid rather than enlarged lymph nodes, however  recommend attention on follow-up. Electronically Signed   By: Keith Rake M.D.   On: 03/28/2019 13:56   DG Chest Port 1 View  Result Date: 03/28/2019 CLINICAL DATA:  Shortness of breath and chest pain EXAM: PORTABLE CHEST 1 VIEW COMPARISON:  10/26/2010 FINDINGS: Cardiac silhouette is partially obscured but appears mildly enlarged. Moderate left-sided pleural effusion with associated left basilar opacity. Streaky right basilar opacity with probable trace right pleural effusion. No pneumothorax. IMPRESSION: 1. Moderate left-sided pleural effusion with associated left basilar opacity, which may reflect atelectasis and/or pneumonia. 2. Streaky right basilar opacity with probable trace right pleural effusion. Electronically Signed   By: Davina Poke D.O.   On: 03/28/2019 12:42    Labs:  CBC: Recent Labs    03/28/19 1206 03/29/19 0437  WBC 14.6* 22.5*  HGB 11.7* 10.7*  HCT 37.0 34.0*  PLT 594* 609*    COAGS: No results for input(s): INR, APTT in the last 8760 hours.  BMP: Recent Labs    03/28/19 1206 03/28/19 2012 03/29/19 0437  NA 134*  --  134*    K 2.8* 2.7* 3.3*  CL 88*  --  95*  CO2 30  --  30  GLUCOSE 127*  --  125*  BUN 8  --  6*  CALCIUM 8.5*  --  8.1*  CREATININE 0.90  --  0.73  GFRNONAA >60  --  >60  GFRAA >60  --  >60    LIVER FUNCTION TESTS: Recent Labs    03/28/19 1206 03/29/19 0437  BILITOT 0.9 0.6  AST 36 27  ALT 30 24  ALKPHOS 166* 139*  PROT 6.6 6.3*  ALBUMIN 2.6* 2.3*     Assessment and Plan:  Left empyema. Plan for image-guided left chest tube placement tentatively for today in IR. Patient is NPO. Afebrile. Ok to proceed with Lovenox use per Dr. Kathlene Cote. INR pending. COVID negative 03/28/2019.  Risks and benefits discussed with the patient including bleeding, infection, damage to adjacent structures, and sepsis. All of the patient's questions were answered, patient is agreeable to proceed. Consent signed and in chart.   Thank you for this interesting consult.  I greatly enjoyed meeting Brooke Ellis and look forward to participating in their care.  A copy of this report was sent to the requesting provider on this date.  Electronically Signed: Earley Abide, PA-C 03/29/2019, 10:10 AM   I spent a total of 40 Minutes in face to face in clinical consultation, greater than 50% of which was counseling/coordinating care for left empyema/left chest tube placement.

## 2019-03-29 NOTE — Procedures (Signed)
Interventional Radiology Procedure Note  Procedure: CT Guided placement of left pleural drain  Complications: None  Estimated Blood Loss: < 10 mL  Findings: 12 Fr drain placed in left pleural space with return of thin, yellow fluid. Fluid samples sent for culture analysis and other requested labs. Drain attached to [employer].  Will follow.  Venetia Night. Kathlene Cote, M.D Pager:  [phone]

## 2019-03-29 NOTE — Progress Notes (Signed)
PROGRESS NOTE    Brooke Ellis  JOI:[id number] DOB: [birth year] DOA: 03/28/2019 PCP: Derinda Late, MD    Brief Narrative:  69 year old lady prior history of asthma, hyperlipidemia, GERD, hiatal hernia presents with worsening shortness of breath associated with cough and chills for 2 weeks.  On arrival she was found to be febrile and a CT angiogram of the chest shows lingular consolidation with ill-defined low-density and focus of air suspicious for necrotic process moderate size left pleural effusion partially loculated.  IR consulted for pigtail catheter placement and pleural fluid to be sent for cytology and cultures.  Meanwhile CT surgery also consulted to see if she needs VATS.  Assessment & Plan:   Active Problems:   GERD (gastroesophageal reflux disease)   Impaired glucose tolerance   High cholesterol   Anemia   Pneumonia   Hypokalemia   Hypomagnesemia   Thrombocytosis (HCC)   Acute respiratory failure with hypoxia secondary to necrotic lung mass/lung abscess/empyema Patient was started on broad-spectrum IV antibiotics, IV vancomycin, Zosyn and azithromycin.  Nasal cannula oxygen to keep sats greater than 90%.  IR consult for pigtail catheter placement and pleural pleural fluid to be sent for analysis and cytology.  Blood cultures have been negative so far.  Get urine for strep pneumoniae and Legionella antigens. Respiratory panel and COVID-19 screening test are negative Urine for strep pneumonia antigen is negative  Hypokalemia, hypomagnesemia Replaced repeat in the morning.   Asthma No wheezing heard    GERD, history of Barrett's esophagus/large hiatal hernia  outpatient follow-up with GI and resume PPI twice daily   Depression Continue with fluoxetine.    Anemia of chronic disease/normocytic anemia Get anemia panel.    Leukocytosis Probably secondary to pneumonia    Thrombocytosis Probably secondary to infection and anemia.      DVT  prophylaxis: Lovenox code Status: Full code Family Communication: Discussed with patient spouse at bedside Disposition Plan:  . Patient came from: Home           . Anticipated d/c place: Probably home . Barriers to d/c OR conditions which need to be met to effect a safe d/c: Pending work-up for lung abscess/empyema   Consultants:   IR  CT surgery  Procedures: Pigtail catheter placement by IR for empyema Antimicrobials: Vancomycin, Zosyn, azithromycin since admission  Subjective: Patient reports she is slightly anxious about the procedure family at bedside she denies any new chest pain, no nausea vomiting or abdominal pain or diarrhea.  Breathing has improved with oxygen supplementation.  Objective: Vitals:   03/29/19 0117 03/29/19 0557 03/29/19 0920 03/29/19 0924  BP: 124/76 124/76 124/79   Pulse: 95 (!) 101 (!) 101   Resp: _0 Temp: 98.9 F (37.2 C) 99.6 F (37.6 C)  (!) 100.4 F (38 C)  TempSrc: Oral Oral  Oral  SpO2: 94% 91% 91%   Weight:      Height:        Intake/Output Summary (Last 24 hours) at 03/29/2019 1249 Last data filed at 03/29/2019 1239 Gross per 24 hour  Intake 1167.13 ml  Output 800 ml  Net 367.13 ml   Filed Weights   03/28/19 1155 03/28/19 1702  Weight: 66 kg 63.5 kg    Examination:  General exam: Patient restless but not in any kind of distress. Respiratory system: Diminished air entry on the left, no wheezing scattered rhonchi present tachypnea present Cardiovascular system: S1 & S2 heard, RRR. No JVD,No pedal edema. Gastrointestinal system: Abdomen is nondistended,  soft and nontender. Normal bowel sounds heard. Central nervous system: Alert and oriented. No focal neurological deficits. Extremities: Symmetric 5 x 5 power. Skin: No rashes, lesions or ulcers Psychiatry: Anxious    Data Reviewed: I have personally reviewed following labs and imaging studies  CBC: Recent Labs  Lab 03/28/19 1206 03/29/19 0437  WBC 14.6* 22.5*    NEUTROABS 12.1*  --   HGB 11.7* 10.7*  HCT 37.0 34.0*  MCV 84.5 84.6  PLT 594* 354*   Basic Metabolic Panel: Recent Labs  Lab 03/28/19 1206 03/28/19 2012 03/29/19 0437  NA 134*  --  134*  K 2.8* 2.7* 3.3*  CL 88*  --  95*  CO2 30  --  30  GLUCOSE 127*  --  125*  BUN 8  --  6*  CREATININE 0.90  --  0.73  CALCIUM 8.5*  --  8.1*  MG 1.2*  --  2.2   GFR: Estimated Creatinine Clearance: 56 mL/min (by C-G formula based on SCr of 0.73 mg/dL). Liver Function Tests: Recent Labs  Lab 03/28/19 1206 03/29/19 0437  AST 36 27  ALT 30 24  ALKPHOS 166* 139*  BILITOT 0.9 0.6  PROT 6.6 6.3*  ALBUMIN 2.6* 2.3*   No results for input(s): LIPASE, AMYLASE in the last 168 hours. No results for input(s): AMMONIA in the last 168 hours. Coagulation Profile: Recent Labs  Lab 03/29/19 1029  INR 1.3*   Cardiac Enzymes: No results for input(s): CKTOTAL, CKMB, CKMBINDEX, TROPONINI in the last 168 hours. BNP (last 3 results) No results for input(s): PROBNP in the last 8760 hours. HbA1C: No results for input(s): HGBA1C in the last 72 hours. CBG: No results for input(s): GLUCAP in the last 168 hours. Lipid Profile: Recent Labs    03/28/19 1206  TRIG 72   Thyroid Function Tests: No results for input(s): TSH, T4TOTAL, FREET4, T3FREE, THYROIDAB in the last 72 hours. Anemia Panel: Recent Labs    03/28/19 1206  FERRITIN 97   Sepsis Labs: Recent Labs  Lab 03/28/19 1206 03/28/19 1402  PROCALCITON 0.12  --   LATICACIDVEN 1.9 1.1    Recent Results (from the past 240 hour(s))  Blood Culture (routine x 2)     Status: None (Preliminary result)   Collection Time: 03/28/19 12:06 PM   Specimen: BLOOD  Result Value Ref Range Status   Specimen Description   Final    BLOOD RIGHT ANTECUBITAL Performed at [facility], [address] [place], [place] [address]    Special Requests   Final    BOTTLES DRAWN AEROBIC AND ANAEROBIC Blood Culture adequate volume Performed at  [facility], [address] [place], [place] [address]    Culture   Final    NO GROWTH < 24 HOURS Performed at [facility], [place] [address], [place], [place] [address]    Report Status PENDING  Incomplete  Blood Culture (routine x 2)     Status: None (Preliminary result)   Collection Time: 03/28/19 12:06 PM   Specimen: BLOOD  Result Value Ref Range Status   Specimen Description   Final    BLOOD LEFT ANTECUBITAL Performed at [facility], [address] [place], [place] [address]    Special Requests   Final    BOTTLES DRAWN AEROBIC AND ANAEROBIC Blood Culture adequate volume Performed at [facility], [address], [place], [place] [address]    Culture  Final    NO GROWTH < 24 HOURS Performed at [facility], [place] [address] [place], [place] [address]    Report Status PENDING  Incomplete  SARS Coronavirus 2 Ag (30 min TAT) - Nasal Swab (BD Veritor Kit)     Status: None   Collection Time: 03/28/19 12:06 PM   Specimen: Nasal Swab (BD Veritor Kit)  Result Value Ref Range Status   SARS Coronavirus 2 Ag NEGATIVE NEGATIVE Final    Comment: (NOTE) SARS-CoV-2 antigen NOT DETECTED.  Negative results are presumptive.  Negative results do not preclude SARS-CoV-2 infection and should not be used as the sole basis for treatment or other patient management decisions, including infection  control decisions, particularly in the presence of clinical signs and  symptoms consistent with COVID-19, or in those who have been in contact with the virus.  Negative results must be combined with clinical observations, patient history, and epidemiological information. The expected result is Negative. Fact Sheet for Patients: PodPark.tn Fact Sheet for Healthcare Providers: GiftContent.is This test is not yet approved or cleared by the [place] FDA and  has been authorized for detection  and/or diagnosis of SARS-CoV-2 by FDA under an Emergency Use Authorization (EUA).  This EUA will remain in effect (meaning this test can be used) for the duration of  the COVID-19 de claration under Section 564(b)(1) of the Act, 21 U.S.C. section 360bbb-3(b)(1), unless the authorization is terminated or revoked sooner. Performed at [facility], [address] [place], [place] [address]   Respiratory Panel by RT PCR (Flu A&B, Covid) - Nasopharyngeal Swab     Status: None   Collection Time: 03/28/19  1:17 PM   Specimen: Nasopharyngeal Swab  Result Value Ref Range Status   SARS Coronavirus 2 by RT PCR NEGATIVE NEGATIVE Final    Comment: (NOTE) SARS-CoV-2 target nucleic acids are NOT DETECTED. The SARS-CoV-2 RNA is generally detectable in upper respiratoy specimens during the acute phase of infection. The lowest concentration of SARS-CoV-2 viral copies this assay can detect is 131 copies/mL. A negative result does not preclude SARS-Cov-2 infection and should not be used as the sole basis for treatment or other patient management decisions. A negative result may occur with  improper specimen collection/handling, submission of specimen other than nasopharyngeal swab, presence of viral mutation(s) within the areas targeted by this assay, and inadequate number of viral copies (<131 copies/mL). A negative result must be combined with clinical observations, patient history, and epidemiological information. The expected result is Negative. Fact Sheet for Patients:  PinkCheek.be Fact Sheet for Healthcare Providers:  GravelBags.it This test is not yet ap proved or cleared by the [place] FDA and  has been authorized for detection and/or diagnosis of SARS-CoV-2 by FDA under an Emergency Use Authorization (EUA). This EUA will remain  in effect (meaning this test can be used) for the duration of the COVID-19 declaration  under Section 564(b)(1) of the Act, 21 U.S.C. section 360bbb-3(b)(1), unless the authorization is terminated or revoked sooner.    Influenza A by PCR NEGATIVE NEGATIVE Final   Influenza B by PCR NEGATIVE NEGATIVE Final    Comment: (NOTE) The Xpert Xpress SARS-CoV-2/FLU/RSV assay is intended as an aid in  the diagnosis of influenza from Nasopharyngeal swab specimens and  should not be used as a sole basis for treatment. Nasal washings and  aspirates are unacceptable for Xpert Xpress SARS-CoV-2/FLU/RSV  testing. Fact Sheet for Patients: PinkCheek.be Fact Sheet for Healthcare Providers: GravelBags.it This  test is not yet approved or cleared by the [place] and  has been authorized for detection and/or diagnosis of SARS-CoV-2 by  FDA under an Emergency Use Authorization (EUA). This EUA will remain  in effect (meaning this test can be used) for the duration of the  Covid-19 declaration under Section 564(b)(1) of the Act, 21  U.S.C. section 360bbb-3(b)(1), unless the authorization is  terminated or revoked. Performed at [facility], [address] [place], [place] [address]          Radiology Studies: CT Angio Chest PE W and/or Wo Contrast  Result Date: 03/28/2019 CLINICAL DATA:  PE suspected, high prob. Shortness of breath for 2 weeks. Chest pain. EXAM: CT ANGIOGRAPHY CHEST WITH CONTRAST TECHNIQUE: Multidetector CT imaging of the chest was performed using the standard protocol during bolus administration of intravenous contrast. Multiplanar CT image reconstructions and MIPs were obtained to evaluate the vascular anatomy. CONTRAST:  2m OMNIPAQUE IOHEXOL 350 MG/ML SOLN COMPARISON:  Chest radiograph earlier this day demonstrating left pleural effusion. FINDINGS: Cardiovascular: There are no filling defects within the pulmonary arteries to suggest pulmonary embolus. Evaluation is diagnostic to the segmental  levels. Cannot assess subsegmental branches due to contrast bolus timing. Thoracic aorta is normal in caliber. No aortic dissection. Heart is normal in size, displaced anteriorly by a large hiatal hernia. No pericardial effusion. Mediastinum/Nodes: Large hiatal hernia, greater than 50% of the stomach is intrathoracic. Mid esophagus is patulous and contains small air-fluid level. Soft tissue density in the left hilum that is subcentimeter, likely represents prominent hilar lymph nodes. There is no right hilar adenopathy. Prominent AP window nodes at 7 and 8 mm. Lobulated fluid/soft tissue density in the anterior mediastinum that is ill-defined and contiguous with loculated pleural fluid, difficult to exclude underlying lymph nodes, for example series 10, images 78 and 90. Lungs/Pleura: Moderate size left pleural effusion is partially loculated. There is lobulated fluid in the inter lobar fissure. Ill-defined lingular consolidation has central low density with focus of air suspicious for necrosis, series 4 and 5 image 44. Consolidation in the left lower lobe and dependent left upper lobe likely represents compressive atelectasis. No right pleural effusion. Compressive atelectasis in the right lower lobe related to hiatal hernia as well as dependently. Upper Abdomen: Post cholecystectomy. No acute findings. Large hiatal hernia with greater than 50% of the stomach intrathoracic. Musculoskeletal: Chronic T11 compression fracture when compared with lumbar spine MRI 03/23/2018 no acute osseous abnormality or evidence of focal bone lesion. Degenerative change of both shoulders. Review of the MIP images confirms the above findings. IMPRESSION: 1. No pulmonary embolus to the segmental level. 2. Heterogeneous lingular consolidation with ill-defined low-density and focus of air suspicious for necrotic process. Differential considerations include necrotic mass versus abscess/pneumonia. Moderate size left pleural effusion which  is partially loculated, with fluid tracking into the interlobar fissure. Recommend fluid and/or tissue sampling. 3. Consolidation in the left lower lobe and dependent left upper lobe likely represents compressive atelectasis related to pleural effusion, however infectious etiology/pneumonia is also considered. 4. Large hiatal hernia with greater than 50% of the stomach intrathoracic. Patulous mid esophagus containing air-fluid level. 5. Prominent AP window and left hilar nodes are likely reactive. Additional density along the left upper mediastinum felt to be related to loculated pleural fluid rather than enlarged lymph nodes, however recommend attention on follow-up. Electronically Signed   By: MKeith RakeM.D.   On: 03/28/2019 13:56   [facility]   Result Date: 03/28/2019 CLINICAL DATA:  Shortness of breath and chest pain EXAM: PORTABLE CHEST 1 VIEW COMPARISON:  10/26/2010 FINDINGS: Cardiac silhouette is partially obscured but appears mildly enlarged. Moderate left-sided pleural effusion with associated left basilar opacity. Streaky right basilar opacity with probable trace right pleural effusion. No pneumothorax. IMPRESSION: 1. Moderate left-sided pleural effusion with associated left basilar opacity, which may reflect atelectasis and/or pneumonia. 2. Streaky right basilar opacity with probable trace right pleural effusion. Electronically Signed   By: Davina Poke D.O.   On: 03/28/2019 12:42        Scheduled Meds: . buPROPion  300 mg Oral Daily  . enoxaparin (LOVENOX) injection  40 mg Subcutaneous Q24H  . FLUoxetine  10 mg Oral Daily  . influenza vaccine adjuvanted  0.5 mL Intramuscular Tomorrow-1000  . montelukast  10 mg Oral Daily  . pantoprazole  40 mg Oral BID   Continuous Infusions: . sodium chloride 75 mL/hr at 03/28/19 1846  . sodium chloride Stopped (03/29/19 0554)  . azithromycin 500 mg (03/29/19 1141)  . piperacillin-tazobactam (ZOSYN)  IV 3.375 g (03/29/19 0554)   . potassium chloride 10 mEq (03/29/19 1145)  . vancomycin       LOS: 1 day        Hosie Poisson, MD [facility]   To contact the attending provider between 7A-7P or the covering provider during after hours 7P-7A, please log into the web site [email or web address] and access using universal [employer] password for that web site. If you do not have the password, please call the hospital operator.  03/29/2019, 12:49 PM

## 2019-03-30 ENCOUNTER — Inpatient Hospital Stay (HOSPITAL_COMMUNITY): Payer: Medicare Other

## 2019-03-30 DIAGNOSIS — J90 Pleural effusion, not elsewhere classified: Secondary | ICD-10-CM

## 2019-03-30 DIAGNOSIS — Z9689 Presence of other specified functional implants: Secondary | ICD-10-CM

## 2019-03-30 DIAGNOSIS — E7800 Pure hypercholesterolemia, unspecified: Secondary | ICD-10-CM

## 2019-03-30 DIAGNOSIS — J9690 Respiratory failure, unspecified, unspecified whether with hypoxia or hypercapnia: Secondary | ICD-10-CM | POA: Diagnosis present

## 2019-03-30 DIAGNOSIS — R7302 Impaired glucose tolerance (oral): Secondary | ICD-10-CM

## 2019-03-30 LAB — CREATININE, SERUM
Creatinine, Ser: 0.77 mg/dL (ref 0.44–1.00)
GFR calc Af Amer: >60 mL/min (ref >60)
GFR calc non Af Amer: >60 mL/min (ref >60)

## 2019-03-30 LAB — BASIC METABOLIC PANEL
Anion gap: 13 (ref 5–15)
BUN: 9 mg/dL (ref 8–23)
CO2: 26 mmol/L (ref 22–32)
Calcium: 8.1 mg/dL — ABNORMAL LOW (ref 8.9–10.3)
Chloride: 95 mmol/L — ABNORMAL LOW (ref 98–111)
Creatinine, Ser: 0.64 mg/dL (ref 0.44–1.00)
GFR calc Af Amer: >60 mL/min (ref >60)
GFR calc non Af Amer: >60 mL/min (ref >60)
Glucose, Bld: 100 mg/dL — ABNORMAL HIGH (ref 70–99)
Potassium: 2.8 mmol/L — ABNORMAL LOW (ref 3.5–5.1)
Sodium: 134 mmol/L — ABNORMAL LOW (ref 135–145)

## 2019-03-30 LAB — CBC
HCT: 33.8 % — ABNORMAL LOW (ref 36.0–46.0)
Hemoglobin: 10.7 g/dL — ABNORMAL LOW (ref 12.0–15.0)
MCH: 26.8 pg (ref 26.0–34.0)
MCHC: 31.7 g/dL (ref 30.0–36.0)
MCV: 84.7 fL (ref 80.0–100.0)
Platelets: 575 10*3/uL — ABNORMAL HIGH (ref 150–400)
RBC: 3.99 MIL/uL (ref 3.87–5.11)
RDW: 13.3 % (ref 11.5–15.5)
WBC: 17.5 10*3/uL — ABNORMAL HIGH (ref 4.0–10.5)
nRBC: 0 % (ref 0.0–0.2)

## 2019-03-30 LAB — LEGIONELLA PNEUMOPHILA SEROGP 1 UR AG: L. pneumophila Serogp 1 Ur Ag: NEGATIVE

## 2019-03-30 LAB — CYTOLOGY - NON PAP

## 2019-03-30 LAB — MAGNESIUM: Magnesium: 2 mg/dL (ref 1.7–2.4)

## 2019-03-30 MED ORDER — POTASSIUM CHLORIDE 10 MEQ/100ML IV SOLN
10.0000 meq | INTRAVENOUS | Status: AC
  Administered 2019-03-30 (×4): 10 meq via INTRAVENOUS
  Filled 2019-03-30 (×4): qty 100

## 2019-03-30 MED ORDER — POTASSIUM CHLORIDE CRYS ER 20 MEQ PO TBCR
40.0000 meq | EXTENDED_RELEASE_TABLET | Freq: Two times a day (BID) | ORAL | Status: DC
  Administered 2019-03-30 – 2019-04-01 (×6): 40 meq via ORAL
  Filled 2019-03-30 (×6): qty 2

## 2019-03-30 NOTE — Progress Notes (Addendum)
Physical Therapy Treatment Patient Details Name: Brooke Ellis MRN: [id number] DOB: Aug 02, 1950 Today's Date: 03/30/2019    History of Present Illness 69 y.o. female with medical history significant of  Depression, asthma, hyperlipidemia, impaired glucose tolerance,  GERD, anxiety presents to [facility] for worsening sob, with cough and chills, generalized malaise for over 2 weeks. Dx of PNA, pleural effusion, ? necrotic lung mass, chest tube to be placed 03/29/19.    PT Comments    Pt with spouse in room, agreeable to seated exercises due to chest tube placed on L side. Pt O2 sat 88% on 1LPM O1 upon arrival and not increasing to 90% or greater with therapeutic exercises so increased O2 to 2LPM. Pt able to perform seated exercises without pain, but declines ambulation at this time due to chest tube. Pt able to stand and perform side steps and standing marching with occasional UE assist on bedrail to steady self, no loss of balance. Pt's O2 sat 88-93% with 2LPM O2. Pt's spouse supportive and encouraging throughout treatment session and pt motivated to get off supplemental O2. RN notified of O2 sat and increasing O2 to 2LPM and pt requesting breathing exercises from RN. Pt limited this session secondary to pain from chest tube placement. Patient will benefit from continued physical therapy in hospital and recommended venue below to increase strength, balance, endurance for safe ADLs and gait.   Follow Up Recommendations  No PT follow up     Equipment Recommendations  Other (comment);3in1 (PT)(rollator)    Recommendations for Other Services       Precautions / Restrictions      Mobility  Bed Mobility Overal bed mobility: Needs Assistance Bed Mobility: Supine to Sit;Sit to Supine     Supine to sit: Min guard Sit to supine: Modified independent (Device/Increase time)   General bed mobility comments: min guard with spouse at pt's side ready to assist, slow movement secondary to pain on L  side  Transfers Overall transfer level: Needs assistance Equipment used: None Transfers: Sit to/from Stand Sit to Stand: Supervision    General transfer comment: slow movement secondary to pain on L side, steadiness upon standing  Ambulation/Gait Ambulation/Gait assistance: Supervision Gait Distance (Feet): 2 Feet Assistive device: None    General Gait Details: sidesteps at bedisde, attatched to wall suction with chest tube placed, limited secondary to pain on L side   Stairs             Wheelchair Mobility    Modified Rankin (Stroke Patients Only)       Balance Overall balance assessment: Modified Independent                 Cognition Arousal/Alertness: Awake/alert Behavior During Therapy: WFL for tasks assessed/performed Overall Cognitive Status: Within Functional Limits for tasks assessed                     Exercises General Exercises - Lower Extremity Long Arc Quad: Seated;Both;15 reps Hip Flexion/Marching: Seated;Both;15 reps;Standing(x15 reps seated, x15 reps standing) Toe Raises: Seated;Both;15 reps Heel Raises: Seated;Both;15 reps    General Comments General comments (skin integrity, edema, etc.): on 1LPM O2 with O2 sat 88% upon arrival, increased to 2LPM O2 and O2 sat 88-93% with seated exercises and mobility      Pertinent Vitals/Pain Pain Assessment: 0-10 Pain Score: 5  Pain Location: L side (chest tube insertion) Pain Descriptors / Indicators: Aching;Sore Pain Intervention(s): Limited activity within patient's tolerance;Monitored during session    Home Living  Prior Function            PT Goals (current goals can now be found in the care plan section) Acute Rehab PT Goals Patient Stated Goal: decrease pain PT Goal Formulation: With patient Time For Goal Achievement: 04/12/19 Potential to Achieve Goals: Good Progress towards PT goals: Progressing toward goals    Frequency    Min 3X/week      PT Plan  Current plan remains appropriate    Co-evaluation              AM-PAC PT "6 Clicks" Mobility   Outcome Measure  Help needed turning from your back to your side while in a flat bed without using bedrails?: None Help needed moving from lying on your back to sitting on the side of a flat bed without using bedrails?: None Help needed moving to and from a bed to a chair (including a wheelchair)?: None Help needed standing up from a chair using your arms (e.g., wheelchair or bedside chair)?: None Help needed to walk in hospital room?: None Help needed climbing 3-5 steps with a railing? : A Little 6 Click Score: 23    End of Session Equipment Utilized During Treatment: Oxygen Activity Tolerance: Patient limited by pain Patient left: in bed;with call bell/phone within reach;with family/visitor present Nurse Communication: Mobility status;Other (comment)(increased O2 to 2LPM) PT Visit Diagnosis: Difficulty in walking, not elsewhere classified (R26.2);Pain     Time: 1320-1340 PT Time Calculation (min) (ACUTE ONLY): 20 min  Charges:  $Therapeutic Exercise: 8-22 mins                      Tori Harrietta Incorvaia PT, DPT 03/30/19, 3:07 PM [phone]

## 2019-03-30 NOTE — Progress Notes (Signed)
Referring Physician(s): Akula,V/Gerhardt,E  Supervising Physician: Aletta Edouard  Patient Status:  [facility] - In-pt  Chief Complaint: Dyspnea,cough, chest pain, loculated left pleural fluid/possible empyema   Subjective: Patient sitting up in chair; states breathing is better since left chest drain placed yesterday; she does have soreness at left chest drain insertion site, sometimes exacerbated by coughing.  She appears comfortable.   Allergies: Macrodantin, Morphine and related, and Sulfa antibiotics  Medications: Prior to Admission medications   Medication Sig Start Date End Date Taking? Authorizing Provider  buPROPion (WELLBUTRIN XL) 300 MG 24 hr tablet Take 300 mg by mouth daily.     Yes [provider]  Cholecalciferol (VITAMIN D) 2000 UNITS tablet Take 2,000 Units by mouth daily.   Yes [provider]  ezetimibe (ZETIA) 10 MG tablet Take 10 mg by mouth every evening.    Yes [provider]  FLUoxetine (PROZAC) 10 MG capsule Take 10 mg by mouth daily.     Yes [provider]  ketotifen (ZADITOR) 0.025 % ophthalmic solution Place 1 drop into both eyes daily as needed. For seasonal allergies    Yes [provider]  levofloxacin (LEVAQUIN) 500 MG tablet Take 500 mg by mouth daily. 03/26/19  Yes [provider]  metFORMIN (GLUCOPHAGE-XR) 500 MG 24 hr tablet Take 500 mg by mouth daily. Reported on 05/13/2015 12/22/13  Yes [provider]  montelukast (SINGULAIR) 10 MG tablet Take 10 mg by mouth daily.    Yes [provider]  omeprazole (PRILOSEC) 40 MG capsule Take 1 tablet 30 minutes before breakfast and 1 tablet 30 minutes before supper 02/14/14  Yes Irene Shipper, MD  ondansetron (ZOFRAN-ODT) 4 MG disintegrating tablet Take 4 mg by mouth every 8 (eight) hours as needed for nausea or vomiting.  03/26/19  Yes [provider]  rosuvastatin (CRESTOR) 40 MG tablet Take 40 mg by mouth daily.   Yes [provider]     Vital Signs: BP 120/71 (BP Location: Right Arm)   Pulse 100   Temp 98.9 F (37.2 C)   Resp 18   Ht 5' (1.524 m)   Wt 140 lb (63.5 kg)   SpO2 94%   BMI 27.34 kg/m   Physical Exam awake, alert.  Left chest drain intact, overlying bandage clean and dry, insertion site mildly tender to palpation, output approximately 390 cc of turbid yellow fluid; Pleur-evac is not to wall suction at this time, no obvious air leak.  Imaging: CT Angio Chest PE W and/or Wo Contrast  Result Date: 03/28/2019 CLINICAL DATA:  PE suspected, high prob. Shortness of breath for 2 weeks. Chest pain. EXAM: CT ANGIOGRAPHY CHEST WITH CONTRAST TECHNIQUE: Multidetector CT imaging of the chest was performed using the standard protocol during bolus administration of intravenous contrast. Multiplanar CT image reconstructions and MIPs were obtained to evaluate the vascular anatomy. CONTRAST:  74mL OMNIPAQUE IOHEXOL 350 MG/ML SOLN COMPARISON:  Chest radiograph earlier this day demonstrating left pleural effusion. FINDINGS: Cardiovascular: There are no filling defects within the pulmonary arteries to suggest pulmonary embolus. Evaluation is diagnostic to the segmental levels. Cannot assess subsegmental branches due to contrast bolus timing. Thoracic aorta is normal in caliber. No aortic dissection. Heart is normal in size, displaced anteriorly by a large hiatal hernia. No pericardial effusion. Mediastinum/Nodes: Large hiatal hernia, greater than 50% of the stomach is intrathoracic. Mid esophagus is patulous and contains small air-fluid level. Soft tissue density in the left hilum that is subcentimeter, likely represents  prominent hilar lymph nodes. There is no right hilar adenopathy. Prominent AP window nodes at 7 and 8 mm. Lobulated fluid/soft tissue density in the anterior mediastinum that is ill-defined and contiguous with loculated pleural fluid, difficult to exclude underlying lymph nodes, for example series 10,  images 78 and 90. Lungs/Pleura: Moderate size left pleural effusion is partially loculated. There is lobulated fluid in the inter lobar fissure. Ill-defined lingular consolidation has central low density with focus of air suspicious for necrosis, series 4 and 5 image 44. Consolidation in the left lower lobe and dependent left upper lobe likely represents compressive atelectasis. No right pleural effusion. Compressive atelectasis in the right lower lobe related to hiatal hernia as well as dependently. Upper Abdomen: Post cholecystectomy. No acute findings. Large hiatal hernia with greater than 50% of the stomach intrathoracic. Musculoskeletal: Chronic T11 compression fracture when compared with lumbar spine MRI 03/23/2018 no acute osseous abnormality or evidence of focal bone lesion. Degenerative change of both shoulders. Review of the MIP images confirms the above findings. IMPRESSION: 1. No pulmonary embolus to the segmental level. 2. Heterogeneous lingular consolidation with ill-defined low-density and focus of air suspicious for necrotic process. Differential considerations include necrotic mass versus abscess/pneumonia. Moderate size left pleural effusion which is partially loculated, with fluid tracking into the interlobar fissure. Recommend fluid and/or tissue sampling. 3. Consolidation in the left lower lobe and dependent left upper lobe likely represents compressive atelectasis related to pleural effusion, however infectious etiology/pneumonia is also considered. 4. Large hiatal hernia with greater than 50% of the stomach intrathoracic. Patulous mid esophagus containing air-fluid level. 5. Prominent AP window and left hilar nodes are likely reactive. Additional density along the left upper mediastinum felt to be related to loculated pleural fluid rather than enlarged lymph nodes, however recommend attention on follow-up. Electronically Signed   By: Keith Rake M.D.   On: 03/28/2019 13:56   DG Chest  Port 1 View  Result Date: 03/30/2019 CLINICAL DATA:  Chest tube placement. EXAM: PORTABLE CHEST 1 VIEW COMPARISON:  Chest x-ray 03/28/2019 FINDINGS: There is a left-sided Cope loop type pleural drainage catheter noted on the left side. Persistent moderate to large complex left pleural fluid collection with new pleural fluid noted along the left lung apex. Stable mild rightward deviation of the trachea somewhat accentuated by the rotation of the patient. Streaky right basilar atelectasis is noted. Remote healed rib fractures. IMPRESSION: Left-sided Cope loop type pleural drainage catheter in place but persistent large complex left pleural fluid collection with new pleural fluid noted over the left apex. Electronically Signed   By: Marijo Sanes M.D.   On: 03/30/2019 07:31   DG Chest Port 1 View  Result Date: 03/28/2019 CLINICAL DATA:  Shortness of breath and chest pain EXAM: PORTABLE CHEST 1 VIEW COMPARISON:  10/26/2010 FINDINGS: Cardiac silhouette is partially obscured but appears mildly enlarged. Moderate left-sided pleural effusion with associated left basilar opacity. Streaky right basilar opacity with probable trace right pleural effusion. No pneumothorax. IMPRESSION: 1. Moderate left-sided pleural effusion with associated left basilar opacity, which may reflect atelectasis and/or pneumonia. 2. Streaky right basilar opacity with probable trace right pleural effusion. Electronically Signed   By: Davina Poke D.O.   On: 03/28/2019 12:42   CT IMAGE GUIDED FLUID DRAIN BY CATHETER  Result Date: 03/29/2019 INDICATION: Left-sided pneumonia with loculated left pleural fluid collection suspicious for empyema. EXAM: CT-GUIDED LEFT PLEURAL DRAINAGE CATHETER PLACEMENT MEDICATIONS: The patient is currently admitted to the hospital and receiving intravenous antibiotics. The antibiotics  were administered within an appropriate time frame prior to the initiation of the procedure. ANESTHESIA/SEDATION: Fentanyl 100  mcg IV; Versed 4.0 mg IV Moderate Sedation Time:  28 minutes. The patient was continuously monitored during the procedure by the interventional radiology nurse under my direct supervision. COMPLICATIONS: None immediate. PROCEDURE: Informed written consent was obtained from the patient after a thorough discussion of the procedural risks, benefits and alternatives. All questions were addressed. Maximal Sterile Barrier Technique was utilized including caps, mask, sterile gowns, sterile gloves, sterile drape, hand hygiene and skin antiseptic. A timeout was performed prior to the initiation of the procedure. CT was performed through the chest in a supine position with the left side rolled up slightly. From a lateral approach, an 18 gauge trocar needle was advanced under CT guidance into the posterolateral left pleural space. After return of fluid, a guidewire was advanced into the pleural space. The tract was dilated and a 12 French pigtail drainage catheter advanced. Catheter position was confirmed by CT. The catheter was aspirated to obtain fluid samples for requested laboratory testing. The catheter was connected to a [employer] device. It was secured at the skin with a Prolene retention suture and Vaseline gauze dressing. FINDINGS: Aspiration of the loculated left pleural effusion yielded thin yellow fluid. After chest tube placement, the catheter is positioned in the posterior pleural space. There is good return of fluid from the drainage catheter. IMPRESSION: CT-guided placement of 12 French pigtail pleural drainage catheter into the posterior aspect of a loculated left pleural effusion/empyema. The drainage catheter was attached to a Pleur-evac device which will be attached to wall suction at -20 cm of water. Electronically Signed   By: Aletta Edouard M.D.   On: 03/29/2019 16:47    Labs:  CBC: Recent Labs    03/28/19 1206 03/29/19 0437  WBC 14.6* 22.5*  HGB 11.7* 10.7*  HCT 37.0 34.0*  PLT  594* 609*    COAGS: Recent Labs    03/29/19 1029  INR 1.3*    BMP: Recent Labs    03/28/19 1206 03/28/19 2012 03/29/19 0437 03/30/19 0447  NA 134*  --  134*  --   K 2.8* 2.7* 3.3*  --   CL 88*  --  95*  --   CO2 30  --  30  --   GLUCOSE 127*  --  125*  --   BUN 8  --  6*  --   CALCIUM 8.5*  --  8.1*  --   CREATININE 0.90  --  0.73 0.77  GFRNONAA >60  --  >60 >60  GFRAA >60  --  >60 >60    LIVER FUNCTION TESTS: Recent Labs    03/28/19 1206 03/29/19 0437  BILITOT 0.9 0.6  AST 36 27  ALT 30 24  ALKPHOS 166* 139*  PROT 6.6 6.3*  ALBUMIN 2.6* 2.3*    Assessment and Plan: Patient with history of pneumonia with loculated left pleural effusion suspicious for empyema, status post left chest drain placement on 3/11; temp 100.6 yesterday evening, currently afebrile; fluid cultures and cytology pending; chest x-ray today with persistent large complex left pleural fluid collection with new pleural fluid noted over the left apex; instructed nurse to attach Pleur-evac to wall suction; continue to monitor chest x-ray, other plans as per primary team and TCTS.   Electronically Signed: D. Rowe Robert, PA-C 03/30/2019, 10:16 AM   I spent a total of 15 minutes at the the patient's bedside AND on the patient's  hospital floor or unit, greater than 50% of which was counseling/coordinating care for left chest tube    Patient ID: Brooke Ellis, female   DOB: 07/11/1950, 68 y.o.   MRN: [id number]

## 2019-03-30 NOTE — Progress Notes (Addendum)
      [address]       [place] [address]             [phone]     I have reviewed this am's vital signs, labs (WBC decreased to 17,500), chest x ray (poor quality, still with large left pleural effusion (loculated)), and pleural fluid results (gram stain-moderate WBCs, no organisms and culture shows no growth thus far). As discussed with Dr. Servando Snare, recommend continuation of left pigtail cathter for drainage and re check CXR in am. If there has been no improvement over the weekend, would recommend transfer to [facility] and undergo surgery (left VATS, drain effusion) likely on Monday.   Chest tube not placed until late yesterday- so not much time to judge improvement   450 ml drained from chest tube - ct was not to suction as ordered- Radiology corrected this  Recommend follow up chest xray on Sunday, if not improvement consider transfer to [facility] on Sunday and consider VATS on Monday afternoodn  I have seen and examined Brooke Ellis and agree with the above assessment  and plan.  [name] MD Beeper [phone] Office [phone] 03/30/2019 2:35 PM

## 2019-03-30 NOTE — Progress Notes (Addendum)
PROGRESS NOTE  Armetta Henri XHB:[id number] DOB: 10-17-1950 DOA: 03/28/2019 PCP: Derinda Late, MD   LOS: 2 days   Brief narrative: As per HPI,  69 year old lady female with past medical history of asthma, hyperlipidemia, GERD, hiatal hernia presented to the hospital with worsening shortness of breath cough and chills for 2 weeks.  In the ED patient was noted to be febrile.  A CT angiogram of the chest was performed which showed lingular consolidation with  ill-defined low-density and focus of air suspicious for necrotic process, moderate size left pleural effusion partially loculated.  IR was consulted for pigtail catheter placement and pleural fluid sent for cytology and cultures.  Meanwhile CT surgery was also consulted and the patient was admitted to the hospital.  Assessment/Plan:  Principal Problem:   Pneumonia Active Problems:   GERD (gastroesophageal reflux disease)   Impaired glucose tolerance   High cholesterol   Anemia   Hypokalemia   Hypomagnesemia   Thrombocytosis (HCC)   Respiratory failure (HCC)   Pleural effusion  Acute respiratory failure with hypoxia secondary to necrotic lung mass/lung abscess/empyema/loculated pleural effusion.  Patient has been started on a broad-spectrum antibiotic including IV Zosyn vancomycin and Zithromax.  Continue supplemental oxygen by nasal cannula.  Status post pigtail catheter by interventional radiology.  Surgery has been consulted for further input. Respiratory panel and COVID-19 screening test are negative. Urine for strep pneumonia antigen is negative.  Pleural fluid Gram stain shows moderate WBC but no organisms.  Fluid culture no growth in less than 12 hours.  Strep urinary antigen was negative.  Legionella urinary antigen pending.  Check chest x-ray in a.m. WBC of 17.5 today, trended down from 22.5.  Hypokalemia.  Will replenish IV and orally.  Check levels in a.m.  Hypomagnesemia replenished.  Improved.  History of  asthma. Continue inhalers as necessary.  GERD, history of Barrett's esophagus/large hiatal hernia PPI  Depression Continue with fluoxetine.  Anemia of chronic disease/normocytic anemia We will closely monitor.  Leukocytosis Probably secondary to pneumonia/complicated effusion.  Thrombocytosis Probably secondary to infection and anemia.  VTE Prophylaxis: Lovenox subcu  Code Status: Full code  Family Communication: None today  Disposition Plan:  . Patient is from home . Likely disposition to home likely in 2 to 3 days . Barriers to discharge: IV antibiotics, CT surgery IR follow-up,   Consultants:  Interventional radiology  CT surgery  Procedures:  IR guided left-sided pigtail catheter placement.  Antibiotics:  . Vancomycin, Zosyn and azithromycin 3/11>  Anti-infectives (From admission, onward)   Start     Dose/Rate Route Frequency Ordered Stop   03/29/19 1200  azithromycin (ZITHROMAX) 500 mg in sodium chloride 0.9 % 250 mL IVPB     500 mg 250 mL/hr over 60 Minutes Intravenous Every 24 hours 03/28/19 1743     03/28/19 2200  piperacillin-tazobactam (ZOSYN) IVPB 3.375 g     3.375 g 12.5 mL/hr over 240 Minutes Intravenous Every 8 hours 03/28/19 2101     03/28/19 2000  piperacillin-tazobactam (ZOSYN) IVPB 3.375 g  Status:  Discontinued     3.375 g 12.5 mL/hr over 240 Minutes Intravenous Every 8 hours 03/28/19 1853 03/28/19 2101   03/28/19 1800  piperacillin-tazobactam (ZOSYN) IVPB 3.375 g  Status:  Discontinued     3.375 g 12.5 mL/hr over 240 Minutes Intravenous Every 8 hours 03/28/19 1743 03/28/19 1853   03/28/19 1800  vancomycin (VANCOREADY) IVPB 1250 mg/250 mL     1,250 mg 166.7 mL/hr over 90 Minutes Intravenous  Once 03/28/19  1747 03/28/19 2018   03/28/19 1800  vancomycin (VANCOCIN) IVPB 1000 mg/200 mL premix  Status:  Discontinued     1,000 mg 200 mL/hr over 60 Minutes Intravenous Every 24 hours 03/28/19 1752 03/28/19 1853   03/28/19 1800   vancomycin (VANCOCIN) IVPB 1000 mg/200 mL premix     1,000 mg 200 mL/hr over 60 Minutes Intravenous Every 24 hours 03/28/19 1853     03/28/19 1415  clindamycin (CLEOCIN) IVPB 600 mg     600 mg 100 mL/hr over 30 Minutes Intravenous  Once 03/28/19 1405 03/28/19 1504   03/28/19 1300  cefTRIAXone (ROCEPHIN) 1 g in sodium chloride 0.9 % 100 mL IVPB     1 g 200 mL/hr over 30 Minutes Intravenous  Once 03/28/19 1256 03/28/19 1437   03/28/19 1300  azithromycin (ZITHROMAX) 500 mg in sodium chloride 0.9 % 250 mL IVPB     500 mg 250 mL/hr over 60 Minutes Intravenous  Once 03/28/19 1256 03/28/19 1519       Subjective: Today, patient was seen and examined at bedside.  Patient denies overt dyspnea, feels better with breathing.  Chest discomfort on coughing.  Objective: Vitals:   03/29/19 2046 03/30/19 0732  BP: 122/73 120/71  Pulse: (!) 101 100  Resp: 18 18  Temp: (!) 100.6 F (38.1 C) 98.9 F (37.2 C)  SpO2: 90% 94%    Intake/Output Summary (Last 24 hours) at 03/30/2019 1120 Last data filed at 03/29/2019 2119 Gross per 24 hour  Intake 1202.49 ml  Output 590 ml  Net 612.49 ml   Filed Weights   03/28/19 1155 03/28/19 1702  Weight: 66 kg 63.5 kg   Body mass index is 27.34 kg/m.   Physical Exam: GENERAL: Patient is alert awake and oriented. Not in obvious distress.  Nasal cannula at 2 L/min. HENT: No scleral pallor or icterus. Pupils equally reactive to light. Oral mucosa is moist NECK: is supple, no gross swelling noted. CHEST: Diminished air entry on the left side.  Left-sided chest tube in place. CVS: S1 and S2 heard, no murmur. Regular rate and rhythm.  ABDOMEN: Soft, non-tender, bowel sounds are present. EXTREMITIES: No edema. CNS: Cranial nerves are intact. No focal motor deficits. SKIN: warm and dry without rashes.  Data Review: I have personally reviewed the following laboratory data and studies,  CBC: Recent Labs  Lab 03/28/19 1206 03/29/19 0437  WBC 14.6* 22.5*    NEUTROABS 12.1*  --   HGB 11.7* 10.7*  HCT 37.0 34.0*  MCV 84.5 84.6  PLT 594* 650*   Basic Metabolic Panel: Recent Labs  Lab 03/28/19 1206 03/28/19 2012 03/29/19 0437 03/30/19 0447  NA 134*  --  134*  --   K 2.8* 2.7* 3.3*  --   CL 88*  --  95*  --   CO2 30  --  30  --   GLUCOSE 127*  --  125*  --   BUN 8  --  6*  --   CREATININE 0.90  --  0.73 0.77  CALCIUM 8.5*  --  8.1*  --   MG 1.2*  --  2.2  --    Liver Function Tests: Recent Labs  Lab 03/28/19 1206 03/29/19 0437  AST 36 27  ALT 30 24  ALKPHOS 166* 139*  BILITOT 0.9 0.6  PROT 6.6 6.3*  ALBUMIN 2.6* 2.3*   No results for input(s): LIPASE, AMYLASE in the last 168 hours. No results for input(s): AMMONIA in the last 168 hours. Cardiac  Enzymes: No results for input(s): CKTOTAL, CKMB, CKMBINDEX, TROPONINI in the last 168 hours. BNP (last 3 results) No results for input(s): BNP in the last 8760 hours.  ProBNP (last 3 results) No results for input(s): PROBNP in the last 8760 hours.  CBG: No results for input(s): GLUCAP in the last 168 hours. Recent Results (from the past 240 hour(s))  Blood Culture (routine x 2)     Status: None (Preliminary result)   Collection Time: 03/28/19 12:06 PM   Specimen: BLOOD  Result Value Ref Range Status   Specimen Description   Final    BLOOD RIGHT ANTECUBITAL Performed at [facility], [address] [place], [place] [address]    Special Requests   Final    BOTTLES DRAWN AEROBIC AND ANAEROBIC Blood Culture adequate volume Performed at [facility], [address] [place], [place] [address]    Culture   Final    NO GROWTH 2 DAYS Performed at [facility], [place] [address] [place], [place] [address]    Report Status PENDING  Incomplete  Blood Culture (routine x 2)     Status: None (Preliminary result)   Collection Time: 03/28/19 12:06 PM   Specimen: BLOOD  Result Value Ref Range Status   Specimen Description   Final    BLOOD LEFT  ANTECUBITAL Performed at [facility], [address] [place], [place] [address]    Special Requests   Final    BOTTLES DRAWN AEROBIC AND ANAEROBIC Blood Culture adequate volume Performed at [facility], [address] [place], [place] [address]    Culture   Final    NO GROWTH 2 DAYS Performed at [facility], [place] [address], [place], [place] [address]    Report Status PENDING  Incomplete  SARS Coronavirus 2 Ag (30 min TAT) - Nasal Swab (BD Veritor Kit)     Status: None   Collection Time: 03/28/19 12:06 PM   Specimen: Nasal Swab (BD Veritor Kit)  Result Value Ref Range Status   SARS Coronavirus 2 Ag NEGATIVE NEGATIVE Final    Comment: (NOTE) SARS-CoV-2 antigen NOT DETECTED.  Negative results are presumptive.  Negative results do not preclude SARS-CoV-2 infection and should not be used as the sole basis for treatment or other patient management decisions, including infection  control decisions, particularly in the presence of clinical signs and  symptoms consistent with COVID-19, or in those who have been in contact with the virus.  Negative results must be combined with clinical observations, patient history, and epidemiological information. The expected result is Negative. Fact Sheet for Patients: PodPark.tn Fact Sheet for Healthcare Providers: GiftContent.is This test is not yet approved or cleared by the [place] FDA and  has been authorized for detection and/or diagnosis of SARS-CoV-2 by FDA under an Emergency Use Authorization (EUA).  This EUA will remain in effect (meaning this test can be used) for the duration of  the COVID-19 de claration under Section 564(b)(1) of the Act, 21 U.S.C. section 360bbb-3(b)(1), unless the authorization is terminated or revoked sooner. Performed at [facility], [address] [place], [place] [address]   Respiratory Panel by RT PCR  (Flu A&B, Covid) - Nasopharyngeal Swab     Status: None   Collection Time: 03/28/19  1:17 PM   Specimen: Nasopharyngeal Swab  Result Value Ref Range Status   SARS Coronavirus 2 by RT PCR NEGATIVE NEGATIVE Final  Comment: (NOTE) SARS-CoV-2 target nucleic acids are NOT DETECTED. The SARS-CoV-2 RNA is generally detectable in upper respiratoy specimens during the acute phase of infection. The lowest concentration of SARS-CoV-2 viral copies this assay can detect is 131 copies/mL. A negative result does not preclude SARS-Cov-2 infection and should not be used as the sole basis for treatment or other patient management decisions. A negative result may occur with  improper specimen collection/handling, submission of specimen other than nasopharyngeal swab, presence of viral mutation(s) within the areas targeted by this assay, and inadequate number of viral copies (<131 copies/mL). A negative result must be combined with clinical observations, patient history, and epidemiological information. The expected result is Negative. Fact Sheet for Patients:  PinkCheek.be Fact Sheet for Healthcare Providers:  GravelBags.it This test is not yet ap proved or cleared by the [place] FDA and  has been authorized for detection and/or diagnosis of SARS-CoV-2 by FDA under an Emergency Use Authorization (EUA). This EUA will remain  in effect (meaning this test can be used) for the duration of the COVID-19 declaration under Section 564(b)(1) of the Act, 21 U.S.C. section 360bbb-3(b)(1), unless the authorization is terminated or revoked sooner.    Influenza A by PCR NEGATIVE NEGATIVE Final   Influenza B by PCR NEGATIVE NEGATIVE Final    Comment: (NOTE) The Xpert Xpress SARS-CoV-2/FLU/RSV assay is intended as an aid in  the diagnosis of influenza from Nasopharyngeal swab specimens and  should not be used as a sole basis for treatment. Nasal  washings and  aspirates are unacceptable for Xpert Xpress SARS-CoV-2/FLU/RSV  testing. Fact Sheet for Patients: PinkCheek.be Fact Sheet for Healthcare Providers: GravelBags.it This test is not yet approved or cleared by the [place] FDA and  has been authorized for detection and/or diagnosis of SARS-CoV-2 by  FDA under an Emergency Use Authorization (EUA). This EUA will remain  in effect (meaning this test can be used) for the duration of the  Covid-19 declaration under Section 564(b)(1) of the Act, 21  U.S.C. section 360bbb-3(b)(1), unless the authorization is  terminated or revoked. Performed at [facility], [address] [place], [place] [address]   Culture, body fluid-bottle     Status: None (Preliminary result)   Collection Time: 03/29/19  3:40 PM   Specimen: Pleura  Result Value Ref Range Status   Specimen Description PLEURAL  Final   Special Requests NONE  Final   Culture   Final    NO GROWTH < 12 HOURS Performed at [facility], [place] [address] [place], [place] [address]    Report Status PENDING  Incomplete  Gram stain     Status: None   Collection Time: 03/29/19  3:40 PM   Specimen: Pleura  Result Value Ref Range Status   Specimen Description PLEURAL  Final   Special Requests NONE  Final   Gram Stain   Final    MODERATE WBC PRESENT, PREDOMINANTLY PMN NO ORGANISMS SEEN Performed at [facility], [place] [address] [place],  [address]    Report Status 03/29/2019 FINAL  Final     Studies: CT Angio Chest PE W and/or Wo Contrast  Result Date: 03/28/2019 CLINICAL DATA:  PE suspected, high prob. Shortness of breath for 2 weeks. Chest pain. EXAM: CT ANGIOGRAPHY CHEST WITH CONTRAST TECHNIQUE: Multidetector CT imaging of the chest was performed using the standard protocol during bolus administration of intravenous contrast. Multiplanar CT image reconstructions and MIPs were obtained  to evaluate the  vascular anatomy. CONTRAST:  16m OMNIPAQUE IOHEXOL 350 MG/ML SOLN COMPARISON:  Chest radiograph earlier this day demonstrating left pleural effusion. FINDINGS: Cardiovascular: There are no filling defects within the pulmonary arteries to suggest pulmonary embolus. Evaluation is diagnostic to the segmental levels. Cannot assess subsegmental branches due to contrast bolus timing. Thoracic aorta is normal in caliber. No aortic dissection. Heart is normal in size, displaced anteriorly by a large hiatal hernia. No pericardial effusion. Mediastinum/Nodes: Large hiatal hernia, greater than 50% of the stomach is intrathoracic. Mid esophagus is patulous and contains small air-fluid level. Soft tissue density in the left hilum that is subcentimeter, likely represents prominent hilar lymph nodes. There is no right hilar adenopathy. Prominent AP window nodes at 7 and 8 mm. Lobulated fluid/soft tissue density in the anterior mediastinum that is ill-defined and contiguous with loculated pleural fluid, difficult to exclude underlying lymph nodes, for example series 10, images 78 and 90. Lungs/Pleura: Moderate size left pleural effusion is partially loculated. There is lobulated fluid in the inter lobar fissure. Ill-defined lingular consolidation has central low density with focus of air suspicious for necrosis, series 4 and 5 image 44. Consolidation in the left lower lobe and dependent left upper lobe likely represents compressive atelectasis. No right pleural effusion. Compressive atelectasis in the right lower lobe related to hiatal hernia as well as dependently. Upper Abdomen: Post cholecystectomy. No acute findings. Large hiatal hernia with greater than 50% of the stomach intrathoracic. Musculoskeletal: Chronic T11 compression fracture when compared with lumbar spine MRI 03/23/2018 no acute osseous abnormality or evidence of focal bone lesion. Degenerative change of both shoulders. Review of the MIP images  confirms the above findings. IMPRESSION: 1. No pulmonary embolus to the segmental level. 2. Heterogeneous lingular consolidation with ill-defined low-density and focus of air suspicious for necrotic process. Differential considerations include necrotic mass versus abscess/pneumonia. Moderate size left pleural effusion which is partially loculated, with fluid tracking into the interlobar fissure. Recommend fluid and/or tissue sampling. 3. Consolidation in the left lower lobe and dependent left upper lobe likely represents compressive atelectasis related to pleural effusion, however infectious etiology/pneumonia is also considered. 4. Large hiatal hernia with greater than 50% of the stomach intrathoracic. Patulous mid esophagus containing air-fluid level. 5. Prominent AP window and left hilar nodes are likely reactive. Additional density along the left upper mediastinum felt to be related to loculated pleural fluid rather than enlarged lymph nodes, however recommend attention on follow-up. Electronically Signed   By: MKeith RakeM.D.   On: 03/28/2019 13:56   DG Chest Port 1 View  Result Date: 03/30/2019 CLINICAL DATA:  Chest tube placement. EXAM: PORTABLE CHEST 1 VIEW COMPARISON:  Chest x-ray 03/28/2019 FINDINGS: There is a left-sided Cope loop type pleural drainage catheter noted on the left side. Persistent moderate to large complex left pleural fluid collection with new pleural fluid noted along the left lung apex. Stable mild rightward deviation of the trachea somewhat accentuated by the rotation of the patient. Streaky right basilar atelectasis is noted. Remote healed rib fractures. IMPRESSION: Left-sided Cope loop type pleural drainage catheter in place but persistent large complex left pleural fluid collection with new pleural fluid noted over the left apex. Electronically Signed   By: PMarijo SanesM.D.   On: 03/30/2019 07:31   DG Chest Port 1 View  Result Date: 03/28/2019 CLINICAL DATA:   Shortness of breath and chest pain EXAM: PORTABLE CHEST 1 VIEW COMPARISON:  10/26/2010 FINDINGS: Cardiac silhouette is partially obscured but appears mildly enlarged. Moderate  left-sided pleural effusion with associated left basilar opacity. Streaky right basilar opacity with probable trace right pleural effusion. No pneumothorax. IMPRESSION: 1. Moderate left-sided pleural effusion with associated left basilar opacity, which may reflect atelectasis and/or pneumonia. 2. Streaky right basilar opacity with probable trace right pleural effusion. Electronically Signed   By: Davina Poke D.O.   On: 03/28/2019 12:42   CT IMAGE GUIDED FLUID DRAIN BY CATHETER  Result Date: 03/29/2019 INDICATION: Left-sided pneumonia with loculated left pleural fluid collection suspicious for empyema. EXAM: CT-GUIDED LEFT PLEURAL DRAINAGE CATHETER PLACEMENT MEDICATIONS: The patient is currently admitted to the hospital and receiving intravenous antibiotics. The antibiotics were administered within an appropriate time frame prior to the initiation of the procedure. ANESTHESIA/SEDATION: Fentanyl 100 mcg IV; Versed 4.0 mg IV Moderate Sedation Time:  28 minutes. The patient was continuously monitored during the procedure by the interventional radiology nurse under my direct supervision. COMPLICATIONS: None immediate. PROCEDURE: Informed written consent was obtained from the patient after a thorough discussion of the procedural risks, benefits and alternatives. All questions were addressed. Maximal Sterile Barrier Technique was utilized including caps, mask, sterile gowns, sterile gloves, sterile drape, hand hygiene and skin antiseptic. A timeout was performed prior to the initiation of the procedure. CT was performed through the chest in a supine position with the left side rolled up slightly. From a lateral approach, an 18 gauge trocar needle was advanced under CT guidance into the posterolateral left pleural space. After return of  fluid, a guidewire was advanced into the pleural space. The tract was dilated and a 12 French pigtail drainage catheter advanced. Catheter position was confirmed by CT. The catheter was aspirated to obtain fluid samples for requested laboratory testing. The catheter was connected to a [employer] device. It was secured at the skin with a Prolene retention suture and Vaseline gauze dressing. FINDINGS: Aspiration of the loculated left pleural effusion yielded thin yellow fluid. After chest tube placement, the catheter is positioned in the posterior pleural space. There is good return of fluid from the drainage catheter. IMPRESSION: CT-guided placement of 12 French pigtail pleural drainage catheter into the posterior aspect of a loculated left pleural effusion/empyema. The drainage catheter was attached to a Pleur-evac device which will be attached to wall suction at -20 cm of water. Electronically Signed   By: Aletta Edouard M.D.   On: 03/29/2019 16:47      Flora Lipps, MD  [facility] 03/30/2019

## 2019-03-30 NOTE — Progress Notes (Signed)
I responded to spiritual care consult for pastoral support. Patient was alert and talking. She said her husband is usually visiting her and she said she was very thankful that her personal source of spiritual care came to visit as well. She said she was very thankful for the care she has received and is looking forward to going home in a few days. I offered continued support and with ministry of presence. Chaplain services available a needed.   Chaplain Resident Fidel Levy  [phone]

## 2019-03-31 ENCOUNTER — Inpatient Hospital Stay (HOSPITAL_COMMUNITY): Payer: Medicare Other

## 2019-03-31 LAB — CBC
HCT: 34.4 % — ABNORMAL LOW (ref 36.0–46.0)
Hemoglobin: 10.6 g/dL — ABNORMAL LOW (ref 12.0–15.0)
MCH: 26.2 pg (ref 26.0–34.0)
MCHC: 30.8 g/dL (ref 30.0–36.0)
MCV: 85.1 fL (ref 80.0–100.0)
Platelets: 595 10*3/uL — ABNORMAL HIGH (ref 150–400)
RBC: 4.04 MIL/uL (ref 3.87–5.11)
RDW: 13.5 % (ref 11.5–15.5)
WBC: 16.2 10*3/uL — ABNORMAL HIGH (ref 4.0–10.5)
nRBC: 0 % (ref 0.0–0.2)

## 2019-03-31 LAB — COMPREHENSIVE METABOLIC PANEL
ALT: 64 U/L — ABNORMAL HIGH (ref 0–44)
AST: 129 U/L — ABNORMAL HIGH (ref 15–41)
Albumin: 2.1 g/dL — ABNORMAL LOW (ref 3.5–5.0)
Alkaline Phosphatase: 137 U/L — ABNORMAL HIGH (ref 38–126)
Anion gap: 13 (ref 5–15)
BUN: 7 mg/dL — ABNORMAL LOW (ref 8–23)
CO2: 25 mmol/L (ref 22–32)
Calcium: 8.6 mg/dL — ABNORMAL LOW (ref 8.9–10.3)
Chloride: 99 mmol/L (ref 98–111)
Creatinine, Ser: 0.59 mg/dL (ref 0.44–1.00)
GFR calc Af Amer: >60 mL/min (ref >60)
GFR calc non Af Amer: >60 mL/min (ref >60)
Glucose, Bld: 86 mg/dL (ref 70–99)
Potassium: 3.6 mmol/L (ref 3.5–5.1)
Sodium: 137 mmol/L (ref 135–145)
Total Bilirubin: 0.9 mg/dL (ref 0.3–1.2)
Total Protein: 6 g/dL — ABNORMAL LOW (ref 6.5–8.1)

## 2019-03-31 LAB — MAGNESIUM: Magnesium: 1.9 mg/dL (ref 1.7–2.4)

## 2019-03-31 LAB — PHOSPHORUS: Phosphorus: 2.6 mg/dL (ref 2.5–4.6)

## 2019-03-31 MED ORDER — IBUPROFEN 200 MG PO TABS
400.0000 mg | ORAL_TABLET | Freq: Four times a day (QID) | ORAL | Status: DC | PRN
  Administered 2019-03-31 – 2019-04-01 (×4): 400 mg via ORAL
  Filled 2019-03-31 (×4): qty 2

## 2019-03-31 MED ORDER — LIP MEDEX EX OINT
TOPICAL_OINTMENT | CUTANEOUS | Status: AC
  Filled 2019-03-31: qty 7

## 2019-03-31 NOTE — Progress Notes (Signed)
PROGRESS NOTE  Brooke Ellis MRN:[id number] DOB: 05/15/1950 DOA: 03/28/2019 PCP: Blomgren, Peter, MD   LOS: 3 days   Brief narrative: As per HPI,  69-year-old lady female with past medical history of asthma, hyperlipidemia, GERD, hiatal hernia presented to the hospital with worsening shortness of breath cough and chills for 2 weeks.  In the ED patient was noted to be febrile.  A CT angiogram of the chest was performed which showed lingular consolidation with  ill-defined low-density and focus of air suspicious for necrotic process, moderate size left pleural effusion partially loculated.  IR was consulted for pigtail catheter placement and pleural fluid sent for cytology and cultures.  Meanwhile CT surgery was also consulted and the patient was admitted to the hospital.  Assessment/Plan:  Principal Problem:   Pneumonia Active Problems:   GERD (gastroesophageal reflux disease)   Impaired glucose tolerance   High cholesterol   Anemia   Hypokalemia   Hypomagnesemia   Thrombocytosis (HCC)   Respiratory failure (HCC)   Pleural effusion  Acute respiratory failure with hypoxia secondary to necrotic lung mass/lung abscess/empyema/loculated pleural effusion.  On  IV Zosyn, vancomycin and Zithromax.  On nasal cannula oxygen. Status post pigtail catheter with suction by interventional radiology.  CT surgery on board.  Will follow recommendations.  Respiratory panel and COVID-19 negative. Urine for strep pneumonia antigen is negative.  Pleural fluid Gram stain shows moderate WBC but no organisms.  Fluid culture no growth in less than 12 hours.  Strep urinary antigen was negative.  Legionella urinary antigen pending.  Chest x-ray from today shows moderate to large left pleural effusion with associated left lower lobe compressive atelectasis.  WBC of 16.2 today.  Leukocytosis trending down.  Blood culture negative in 2 days.  Hypokalemia.  Improved after replacement.  Continue p.o. potassium.   Potassium of 3.6 today.  Hypomagnesemia replenished.  Improved.  Last magnesium level of 1.9.  Phosphorus within normal limits.  History of asthma. Continue inhalers as necessary.  On supplemental oxygen.  No overt wheezing.  GERD, history of Barrett's esophagus/large hiatal hernia Continue PPI  Depression Continue with fluoxetine.  Anemia of chronic disease/normocytic anemia We will closely monitor.  Hemoglobin of 10.6.  No evidence of bleeding.  Leukocytosis Probably secondary to pneumonia/complicated effusion.  Trending down.  Thrombocytosis Probably secondary to infection and anemia.  VTE Prophylaxis: Lovenox subcu  Code Status: Full code  Family Communication: I tried to call the patient's spouse on the phone provided but was unable to reach him.  Disposition Plan:  . Patient is from home . Likely disposition to home likely in 2 to 3 days . Barriers to discharge: IV antibiotics, CT surgery/ IR follow-up,   Consultants:  Interventional radiology  CT surgery  Procedures:  CT guided left-sided pigtail catheter placement.  Antibiotics:  . Vancomycin, Zosyn and azithromycin 3/11>  Anti-infectives (From admission, onward)   Start     Dose/Rate Route Frequency Ordered Stop   03/29/19 1200  azithromycin (ZITHROMAX) 500 mg in sodium chloride 0.9 % 250 mL IVPB     500 mg 250 mL/hr over 60 Minutes Intravenous Every 24 hours 03/28/19 1743     03/28/19 2200  piperacillin-tazobactam (ZOSYN) IVPB 3.375 g     3.375 g 12.5 mL/hr over 240 Minutes Intravenous Every 8 hours 03/28/19 2101     03/28/19 2000  piperacillin-tazobactam (ZOSYN) IVPB 3.375 g  Status:  Discontinued     3.375 g 12.5 mL/hr over 240 Minutes Intravenous Every 8 hours 03/28/19 1853   03/28/19 2101   03/28/19 1800  piperacillin-tazobactam (ZOSYN) IVPB 3.375 g  Status:  Discontinued     3.375 g 12.5 mL/hr over 240 Minutes Intravenous Every 8 hours 03/28/19 1743 03/28/19 1853   03/28/19 1800   vancomycin (VANCOREADY) IVPB 1250 mg/250 mL     1,250 mg 166.7 mL/hr over 90 Minutes Intravenous  Once 03/28/19 1747 03/28/19 2018   03/28/19 1800  vancomycin (VANCOCIN) IVPB 1000 mg/200 mL premix  Status:  Discontinued     1,000 mg 200 mL/hr over 60 Minutes Intravenous Every 24 hours 03/28/19 1752 03/28/19 1853   03/28/19 1800  vancomycin (VANCOCIN) IVPB 1000 mg/200 mL premix     1,000 mg 200 mL/hr over 60 Minutes Intravenous Every 24 hours 03/28/19 1853     03/28/19 1415  clindamycin (CLEOCIN) IVPB 600 mg     600 mg 100 mL/hr over 30 Minutes Intravenous  Once 03/28/19 1405 03/28/19 1504   03/28/19 1300  cefTRIAXone (ROCEPHIN) 1 g in sodium chloride 0.9 % 100 mL IVPB     1 g 200 mL/hr over 30 Minutes Intravenous  Once 03/28/19 1256 03/28/19 1437   03/28/19 1300  azithromycin (ZITHROMAX) 500 mg in sodium chloride 0.9 % 250 mL IVPB     500 mg 250 mL/hr over 60 Minutes Intravenous  Once 03/28/19 1256 03/28/19 1519     Subjective: Today, denies chest pain, increasing shortness of breath, fever or chills.  Has mild cough without sputum production.  Denies any nausea vomiting or abdominal pain.  Objective: Vitals:   03/31/19 0631 03/31/19 0838  BP: 129/76 121/77  Pulse: 98 99  Resp: 18   Temp: 99.1 F (37.3 C) 98.9 F (37.2 C)  SpO2: 96% 91%    Intake/Output Summary (Last 24 hours) at 03/31/2019 1028 Last data filed at 03/30/2019 1855 Gross per 24 hour  Intake 2428.76 ml  Output 930 ml  Net 1498.76 ml   Filed Weights   03/28/19 1155 03/28/19 1702  Weight: 66 kg 63.5 kg   Body mass index is 27.34 kg/m.   Physical Exam:  GENERAL: Patient is alert awake and oriented. Not in obvious distress.  Nasal cannula at 2 L/min. HENT: No scleral pallor or icterus. Pupils equally reactive to light. Oral mucosa is moist NECK: is supple, no gross swelling noted. CHEST: Diminished air entry on the left side.  Left-sided pigtail catheter in place. CVS: S1 and S2 heard, no murmur. Regular  rate and rhythm.  ABDOMEN: Soft, non-tender, bowel sounds are present. EXTREMITIES: No edema. CNS: Cranial nerves are intact. No focal motor deficits. SKIN: warm and dry without rashes.  Data Review: I have personally reviewed the following laboratory data and studies,  CBC: Recent Labs  Lab 03/28/19 1206 03/29/19 0437 03/30/19 1147 03/31/19 0546  WBC 14.6* 22.5* 17.5* 16.2*  NEUTROABS 12.1*  --   --   --   HGB 11.7* 10.7* 10.7* 10.6*  HCT 37.0 34.0* 33.8* 34.4*  MCV 84.5 84.6 84.7 85.1  PLT 594* 609* 575* 798*   Basic Metabolic Panel: Recent Labs  Lab 03/28/19 1206 03/28/19 2012 03/29/19 0437 03/30/19 0447 03/30/19 1147 03/31/19 0546  NA 134*  --  134*  --  134* 137  K 2.8* 2.7* 3.3*  --  2.8* 3.6  CL 88*  --  95*  --  95* 99  CO2 30  --  30  --  26 25  GLUCOSE 127*  --  125*  --  100* 86  BUN 8  --  6*  --  9 7*  CREATININE 0.90  --  0.73 0.77 0.64 0.59  CALCIUM 8.5*  --  8.1*  --  8.1* 8.6*  MG 1.2*  --  2.2  --  2.0 1.9  PHOS  --   --   --   --   --  2.6   Liver Function Tests: Recent Labs  Lab 03/28/19 1206 03/29/19 0437 03/31/19 0546  AST 36 27 129*  ALT 30 24 64*  ALKPHOS 166* 139* 137*  BILITOT 0.9 0.6 0.9  PROT 6.6 6.3* 6.0*  ALBUMIN 2.6* 2.3* 2.1*   No results for input(s): LIPASE, AMYLASE in the last 168 hours. No results for input(s): AMMONIA in the last 168 hours. Cardiac Enzymes: No results for input(s): CKTOTAL, CKMB, CKMBINDEX, TROPONINI in the last 168 hours. BNP (last 3 results) No results for input(s): BNP in the last 8760 hours.  ProBNP (last 3 results) No results for input(s): PROBNP in the last 8760 hours.  CBG: No results for input(s): GLUCAP in the last 168 hours. Recent Results (from the past 240 hour(s))  Blood Culture (routine x 2)     Status: None (Preliminary result)   Collection Time: 03/28/19 12:06 PM   Specimen: BLOOD  Result Value Ref Range Status   Specimen Description   Final    BLOOD RIGHT ANTECUBITAL  Performed at [facility] [place], [address] [place], [place] [address]    Special Requests   Final    BOTTLES DRAWN AEROBIC AND ANAEROBIC Blood Culture adequate volume Performed at [facility] [place], [address] [place], [place] [address]    Culture   Final    NO GROWTH 2 DAYS Performed at [facility], [address] [place], [place] [address]    Report Status PENDING  Incomplete  Blood Culture (routine x 2)     Status: None (Preliminary result)   Collection Time: 03/28/19 12:06 PM   Specimen: BLOOD  Result Value Ref Range Status   Specimen Description   Final    BLOOD LEFT ANTECUBITAL Performed at [facility] [place], [address] [place], [place] [address]    Special Requests   Final    BOTTLES DRAWN AEROBIC AND ANAEROBIC Blood Culture adequate volume Performed at [facility] [place], [address] [place], [place] [address]    Culture   Final    NO GROWTH 2 DAYS Performed at [facility], [address] [place], [place] [address]    Report Status PENDING  Incomplete  SARS Coronavirus 2 Ag (30 min TAT) - Nasal Swab (BD Veritor Kit)     Status: None   Collection Time: 03/28/19 12:06 PM   Specimen: Nasal Swab (BD Veritor Kit)  Result Value Ref Range Status   SARS Coronavirus 2 Ag NEGATIVE NEGATIVE Final    Comment: (NOTE) SARS-CoV-2 antigen NOT DETECTED.  Negative results are presumptive.  Negative results do not preclude SARS-CoV-2 infection and should not be used as the sole basis for treatment or other patient management decisions, including infection  control decisions, particularly in the presence of clinical signs and  symptoms consistent with COVID-19, or in those who have been in contact with the virus.  Negative results must be combined with clinical observations, patient history, and epidemiological information. The expected result is Negative. Fact Sheet for Patients: https://www.fda.gov/media/139754/download Fact  Sheet for Healthcare Providers: https://www.fda.gov/media/139753/download This test is not yet approved or cleared   by the [place] and  has been authorized for detection and/or diagnosis of SARS-CoV-2 by FDA under an Emergency Use Authorization (EUA).  This EUA will remain in effect (meaning this test can be used) for the duration of  the COVID-19 de claration under Section 564(b)(1) of the Act, 21 U.S.C. section 360bbb-3(b)(1), unless the authorization is terminated or revoked sooner. Performed at [facility], [address] [place], [place] [address]   Respiratory Panel by RT PCR (Flu A&B, Covid) - Nasopharyngeal Swab     Status: None   Collection Time: 03/28/19  1:17 PM   Specimen: Nasopharyngeal Swab  Result Value Ref Range Status   SARS Coronavirus 2 by RT PCR NEGATIVE NEGATIVE Final    Comment: (NOTE) SARS-CoV-2 target nucleic acids are NOT DETECTED. The SARS-CoV-2 RNA is generally detectable in upper respiratoy specimens during the acute phase of infection. The lowest concentration of SARS-CoV-2 viral copies this assay can detect is 131 copies/mL. A negative result does not preclude SARS-Cov-2 infection and should not be used as the sole basis for treatment or other patient management decisions. A negative result may occur with  improper specimen collection/handling, submission of specimen other than nasopharyngeal swab, presence of viral mutation(s) within the areas targeted by this assay, and inadequate number of viral copies (<131 copies/mL). A negative result must be combined with clinical observations, patient history, and epidemiological information. The expected result is Negative. Fact Sheet for Patients:  PinkCheek.be Fact Sheet for Healthcare Providers:  GravelBags.it This test is not yet ap proved or cleared by the [place] FDA and  has been authorized for detection and/or  diagnosis of SARS-CoV-2 by FDA under an Emergency Use Authorization (EUA). This EUA will remain  in effect (meaning this test can be used) for the duration of the COVID-19 declaration under Section 564(b)(1) of the Act, 21 U.S.C. section 360bbb-3(b)(1), unless the authorization is terminated or revoked sooner.    Influenza A by PCR NEGATIVE NEGATIVE Final   Influenza B by PCR NEGATIVE NEGATIVE Final    Comment: (NOTE) The Xpert Xpress SARS-CoV-2/FLU/RSV assay is intended as an aid in  the diagnosis of influenza from Nasopharyngeal swab specimens and  should not be used as a sole basis for treatment. Nasal washings and  aspirates are unacceptable for Xpert Xpress SARS-CoV-2/FLU/RSV  testing. Fact Sheet for Patients: PinkCheek.be Fact Sheet for Healthcare Providers: GravelBags.it This test is not yet approved or cleared by the [place] FDA and  has been authorized for detection and/or diagnosis of SARS-CoV-2 by  FDA under an Emergency Use Authorization (EUA). This EUA will remain  in effect (meaning this test can be used) for the duration of the  Covid-19 declaration under Section 564(b)(1) of the Act, 21  U.S.C. section 360bbb-3(b)(1), unless the authorization is  terminated or revoked. Performed at [facility], [address] [place], [place] [address]   Culture, body fluid-bottle     Status: None (Preliminary result)   Collection Time: 03/29/19  3:40 PM   Specimen: Pleura  Result Value Ref Range Status   Specimen Description PLEURAL  Final   Special Requests NONE  Final   Culture   Final    NO GROWTH < 12 HOURS Performed at [facility], [place] [address], [place], [place] [address]    Report Status PENDING  Incomplete  Gram stain     Status: None   Collection Time: 03/29/19  3:40 PM   Specimen:  Pleura  Result Value Ref Range Status   Specimen Description PLEURAL  Final   Special Requests NONE   Final   Gram Stain   Final    MODERATE WBC PRESENT, PREDOMINANTLY PMN NO ORGANISMS SEEN Performed at [facility], [address] [place], [place] [address]    Report Status 03/29/2019 FINAL  Final     Studies: DG Chest Port 1 View  Result Date: 03/31/2019 CLINICAL DATA:  Follow-up left chest tube EXAM: PORTABLE CHEST 1 VIEW COMPARISON:  03/30/2019 FINDINGS: Moderate to large left pleural effusion with apical capping. Indwelling left pigtail chest drain. Associated left lower lobe compressive atelectasis. Minimal right basilar atelectasis. Right lung is otherwise clear. No pneumothorax. The heart is top-normal in size. IMPRESSION: Moderate to large left pleural effusion. Associated left lower lobe compressive atelectasis. Indwelling left pigtail chest drain. Electronically Signed   By: Sriyesh  Krishnan M.D.   On: 03/31/2019 06:49   DG Chest Port 1 View  Result Date: 03/30/2019 CLINICAL DATA:  Chest tube placement. EXAM: PORTABLE CHEST 1 VIEW COMPARISON:  Chest x-ray 03/28/2019 FINDINGS: There is a left-sided Cope loop type pleural drainage catheter noted on the left side. Persistent moderate to large complex left pleural fluid collection with new pleural fluid noted along the left lung apex. Stable mild rightward deviation of the trachea somewhat accentuated by the rotation of the patient. Streaky right basilar atelectasis is noted. Remote healed rib fractures. IMPRESSION: Left-sided Cope loop type pleural drainage catheter in place but persistent large complex left pleural fluid collection with new pleural fluid noted over the left apex. Electronically Signed   By: P.  Gallerani M.D.   On: 03/30/2019 07:31   CT IMAGE GUIDED FLUID DRAIN BY CATHETER  Result Date: 03/29/2019 INDICATION: Left-sided pneumonia with loculated left pleural fluid collection suspicious for empyema. EXAM: CT-GUIDED LEFT PLEURAL DRAINAGE CATHETER PLACEMENT MEDICATIONS: The patient is currently admitted to the  hospital and receiving intravenous antibiotics. The antibiotics were administered within an appropriate time frame prior to the initiation of the procedure. ANESTHESIA/SEDATION: Fentanyl 100 mcg IV; Versed 4.0 mg IV Moderate Sedation Time:  28 minutes. The patient was continuously monitored during the procedure by the interventional radiology nurse under my direct supervision. COMPLICATIONS: None immediate. PROCEDURE: Informed written consent was obtained from the patient after a thorough discussion of the procedural risks, benefits and alternatives. All questions were addressed. Maximal Sterile Barrier Technique was utilized including caps, mask, sterile gowns, sterile gloves, sterile drape, hand hygiene and skin antiseptic. A timeout was performed prior to the initiation of the procedure. CT was performed through the chest in a supine position with the left side rolled up slightly. From a lateral approach, an 18 gauge trocar needle was advanced under CT guidance into the posterolateral left pleural space. After return of fluid, a guidewire was advanced into the pleural space. The tract was dilated and a 12 French pigtail drainage catheter advanced. Catheter position was confirmed by CT. The catheter was aspirated to obtain fluid samples for requested laboratory testing. The catheter was connected to a Sahara Pleur-evac device. It was secured at the skin with a Prolene retention suture and Vaseline gauze dressing. FINDINGS: Aspiration of the loculated left pleural effusion yielded thin yellow fluid. After chest tube placement, the catheter is positioned in the posterior pleural space. There is good return of fluid from the drainage catheter. IMPRESSION: CT-guided placement of 12 French pigtail pleural drainage catheter into the posterior aspect of a loculated left pleural effusion/empyema. The   drainage catheter was attached to a Pleur-evac device which will be attached to wall suction at -20 cm of water.  Electronically Signed   By: Glenn  Yamagata M.D.   On: 03/29/2019 16:47      Laxman Pokhrel, MD  [facility] 03/31/2019             

## 2019-03-31 NOTE — Progress Notes (Signed)
I spoke with the patient son Dr. Reece Levy and updated him about the clinical condition of the patient .Will try to reach the patient's husband  at [phone] in am. Patient's son wishes CT surgery to provide an updated for him when possible.

## 2019-03-31 NOTE — Progress Notes (Signed)
Physical Therapy Treatment Patient Details Name: Brooke Ellis MRN: [id number] DOB: Oct 02, 1950 Today's Date: 03/31/2019    History of Present Illness 69 y.o. female with medical history significant of  Depression, asthma, hyperlipidemia, impaired glucose tolerance,  GERD, anxiety presents to [facility] for worsening sob, with cough and chills, generalized malaise for over 2 weeks. Dx of PNA, pleural effusion, ? necrotic lung mass, chest tube to be placed 03/29/19.    PT Comments    Pt able to march in place and ambulate forward and backwards at bedside on RA with O2 sat 90-92% and no increased work of breathing. Pt with improving bed mobility, able to come to EOB and return to EOB without physical assistance, therapist only managing IV line and chest tube for safety. Educated pt on weaning O2 due to previously being on none; pt's personal pulse ox reading 88-85% with mobility and hospital pulse ox reading 90-92%. Educated pt on signs of desat, weaning from O2, and pursed lip breathing to maintain adequate oxygenation. Returned pt to 1LPM O2 at EOS per RN orders. Patient will benefit from continued physical therapy in hospital and recommended venue below to increase strength, balance, endurance for safe ADLs and gait.   Follow Up Recommendations  No PT follow up     Equipment Recommendations  Other (comment);3in1 (PT)(rollator)    Recommendations for Other Services       Precautions / Restrictions Precautions Precautions: Other (comment) Precaution Comments: monitor O2; pt denies h/o falls in past 1 year Restrictions Weight Bearing Restrictions: No    Mobility  Bed Mobility Overal bed mobility: Modified Independent      General bed mobility comments: increased time due to pain on L side with movement, spouse at side for encouragement  Transfers Overall transfer level: Needs assistance Equipment used: None Transfers: Sit to/from Stand Sit to Stand: Supervision      General  transfer comment: slow movement secondary to pain on L side, hands on bed to assist in rising  Ambulation/Gait Ambulation/Gait assistance: Supervision Gait Distance (Feet): 24 Feet(x2 reps with seated rest break between) Assistive device: None Gait Pattern/deviations: Step-through pattern;Decreased stride length Gait velocity: slightly decreased   General Gait Details: limited to fwd/back steps at bedside because attached to wall suction with chest tube, good foot clearance, no veering or loss of balance   Stairs             Wheelchair Mobility    Modified Rankin (Stroke Patients Only)       Balance Overall balance assessment: Independent                 Cognition Arousal/Alertness: Awake/alert Behavior During Therapy: WFL for tasks assessed/performed Overall Cognitive Status: Within Functional Limits for tasks assessed                     Exercises      General Comments General comments (skin integrity, edema, etc.): on RA with ambulation and O2 sat 90-92%, but personal home pulse ox reader reading 85-88% with mobility, pt denies SOB, dizziness, lightheadedness and no increased work of breathing      Pertinent Vitals/Pain Pain Assessment: Faces Faces Pain Scale: Hurts little more Pain Location: L side (chest tube insertion) Pain Descriptors / Indicators: Grimacing;Guarding Pain Intervention(s): Limited activity within patient's tolerance;Monitored during session    Home Living                      Prior Function  PT Goals (current goals can now be found in the care plan section) Acute Rehab PT Goals Patient Stated Goal: decrease pain PT Goal Formulation: With patient Time For Goal Achievement: 04/12/19 Potential to Achieve Goals: Good Progress towards PT goals: Progressing toward goals    Frequency    Min 3X/week      PT Plan Current plan remains appropriate    Co-evaluation              AM-PAC PT "6  Clicks" Mobility   Outcome Measure  Help needed turning from your back to your side while in a flat bed without using bedrails?: None Help needed moving from lying on your back to sitting on the side of a flat bed without using bedrails?: None Help needed moving to and from a bed to a chair (including a wheelchair)?: None Help needed standing up from a chair using your arms (e.g., wheelchair or bedside chair)?: None Help needed to walk in hospital room?: None Help needed climbing 3-5 steps with a railing? : A Little 6 Click Score: 23    End of Session Equipment Utilized During Treatment: Oxygen(returned 1LPM O2 at EOS per RN orders) Activity Tolerance: Patient tolerated treatment well Patient left: in bed;with call bell/phone within reach;with family/visitor present Nurse Communication: Mobility status;Other (comment)(O2 sat on RA, pt returned to 1LPM O2 at EOS) PT Visit Diagnosis: Difficulty in walking, not elsewhere classified (R26.2);Pain     Time: 9163-8466 PT Time Calculation (min) (ACUTE ONLY): 26 min  Charges:  $Gait Training: 8-22 mins $Self Care/Home Management: 8-22                      Talbot Grumbling PT, DPT 03/31/19, 12:34 PM [phone]

## 2019-04-01 ENCOUNTER — Inpatient Hospital Stay (HOSPITAL_COMMUNITY): Payer: Medicare Other

## 2019-04-01 ENCOUNTER — Encounter (HOSPITAL_COMMUNITY): Payer: Self-pay | Admitting: Internal Medicine

## 2019-04-01 DIAGNOSIS — J90 Pleural effusion, not elsewhere classified: Secondary | ICD-10-CM

## 2019-04-01 LAB — BASIC METABOLIC PANEL
Anion gap: 11 (ref 5–15)
BUN: 6 mg/dL — ABNORMAL LOW (ref 8–23)
CO2: 28 mmol/L (ref 22–32)
Calcium: 8.4 mg/dL — ABNORMAL LOW (ref 8.9–10.3)
Chloride: 102 mmol/L (ref 98–111)
Creatinine, Ser: 0.47 mg/dL (ref 0.44–1.00)
GFR calc Af Amer: >60 mL/min (ref >60)
GFR calc non Af Amer: >60 mL/min (ref >60)
Glucose, Bld: 122 mg/dL — ABNORMAL HIGH (ref 70–99)
Potassium: 3.5 mmol/L (ref 3.5–5.1)
Sodium: 141 mmol/L (ref 135–145)

## 2019-04-01 LAB — CBC
HCT: 32.1 % — ABNORMAL LOW (ref 36.0–46.0)
Hemoglobin: 10.1 g/dL — ABNORMAL LOW (ref 12.0–15.0)
MCH: 26.4 pg (ref 26.0–34.0)
MCHC: 31.5 g/dL (ref 30.0–36.0)
MCV: 83.8 fL (ref 80.0–100.0)
Platelets: 583 10*3/uL — ABNORMAL HIGH (ref 150–400)
RBC: 3.83 MIL/uL — ABNORMAL LOW (ref 3.87–5.11)
RDW: 13.7 % (ref 11.5–15.5)
WBC: 12.4 10*3/uL — ABNORMAL HIGH (ref 4.0–10.5)
nRBC: 0 % (ref 0.0–0.2)

## 2019-04-01 LAB — VANCOMYCIN, PEAK: Vancomycin Pk: 19 ug/mL — ABNORMAL LOW (ref 30–40)

## 2019-04-01 NOTE — Progress Notes (Signed)
Report given to [place] on 2W at [facility]. [facility] called and transportation set up. Patient and husband aware.

## 2019-04-01 NOTE — Progress Notes (Signed)
PROGRESS NOTE  Brooke Ellis HYW:[id number] DOB: [birth year] DOA: 03/28/2019 PCP: Derinda Late, MD   LOS: 4 days   Brief narrative: As per HPI,  69 year old lady female with past medical history of asthma, hyperlipidemia, GERD, hiatal hernia presented to the hospital with worsening shortness of breath cough and chills for 2 weeks.  In the ED, patient was noted to be febrile.  A CT angiogram of the chest was performed which showed lingular consolidation with  ill-defined low-density and focus of air suspicious for necrotic process, moderate size left pleural effusion partially loculated.  IR was consulted for pigtail catheter placement and pleural fluid sent for cytology and cultures.  Meanwhile CT surgery was also consulted and the patient was admitted to the hospital.  Assessment/Plan:  Principal Problem:   Pneumonia Active Problems:   GERD (gastroesophageal reflux disease)   Impaired glucose tolerance   High cholesterol   Anemia   Hypokalemia   Hypomagnesemia   Thrombocytosis (HCC)   Respiratory failure (HCC)   Pleural effusion  Acute respiratory failure with hypoxia secondary to necrotic lung mass/lung abscess/empyema/loculated pleural effusion.  On  IV Zosyn, vancomycin and Zithromax.  On nasal cannula oxygen -2 L/min..  Hemodynamically stable otherwise.  No fever.  Status post pigtail catheter with suction by interventional radiology but chest x-ray does not show any significant improvement..  CT surgery on board and recommend transfer to [facility] for VATS.  Respiratory viral panel and COVID-19 negative. Urine for strep pneumonia antigen is negative.  Pleural fluid Gram stain shows moderate WBC but no organisms.  Fluid culture no growth so far.  Strep urinary antigen was negative.  Legionella urinary antigen negative.  Chest x-ray from today shows moderate to large left pleural effusion with associated left lower lobe compressive atelectasis.  WBC of 16.2 today.   Leukocytosis trending down to 12.4 today..  Blood cultures negative so far.  Hypokalemia.  Improved after replacement.  Continue p.o. potassium.  Potassium of 3.5 today.  Hypomagnesemia replenished.  Improved.  Last magnesium level of 1.9.  Phosphorus within normal limits.  History of asthma. Continue inhalers as necessary. No overt wheezing.  On 2 L of oxygen.  GERD, history of Barrett's esophagus/large hiatal hernia Continue PPI  Depression Continue with fluoxetine.  Anemia of chronic disease/normocytic anemia We will closely monitor.  Hemoglobin of 10.1.  No evidence of bleeding.  Thrombocytosis Probably secondary to infection and anemia.  VTE Prophylaxis: Lovenox subcu  Code Status: Full code  Family Communication: I spoke with the patient's spouse on the phone and updated him about the clinical condition of the patient and the plan for transfer to [facility]  I also had a prolonged discussion with the patient's son yesterday.  Disposition Plan:  . Patient is from home . Likely disposition to home likely in 2 to 3 days . Barriers to discharge: Transfer to [facility] for possible VATS, IV antibiotics, CT surgery intervention.   Consultants:  Interventional radiology  CT surgery  Procedures:  CT guided left-sided pigtail catheter placement on 03/29/2019  Antibiotics:  . Vancomycin, Zosyn and azithromycin 3/11>  Anti-infectives (From admission, onward)   Start     Dose/Rate Route Frequency Ordered Stop   03/29/19 1200  azithromycin (ZITHROMAX) 500 mg in sodium chloride 0.9 % 250 mL IVPB     500 mg 250 mL/hr over 60 Minutes Intravenous Every 24 hours 03/28/19 1743     03/28/19 2200  piperacillin-tazobactam (ZOSYN) IVPB 3.375 g  3.375 g 12.5 mL/hr over 240 Minutes Intravenous Every 8 hours 03/28/19 2101     03/28/19 2000  piperacillin-tazobactam (ZOSYN) IVPB 3.375 g  Status:  Discontinued     3.375 g 12.5 mL/hr over 240 Minutes Intravenous  Every 8 hours 03/28/19 1853 03/28/19 2101   03/28/19 1800  piperacillin-tazobactam (ZOSYN) IVPB 3.375 g  Status:  Discontinued     3.375 g 12.5 mL/hr over 240 Minutes Intravenous Every 8 hours 03/28/19 1743 03/28/19 1853   03/28/19 1800  vancomycin (VANCOREADY) IVPB 1250 mg/250 mL     1,250 mg 166.7 mL/hr over 90 Minutes Intravenous  Once 03/28/19 1747 03/28/19 2018   03/28/19 1800  vancomycin (VANCOCIN) IVPB 1000 mg/200 mL premix  Status:  Discontinued     1,000 mg 200 mL/hr over 60 Minutes Intravenous Every 24 hours 03/28/19 1752 03/28/19 1853   03/28/19 1800  vancomycin (VANCOCIN) IVPB 1000 mg/200 mL premix     1,000 mg 200 mL/hr over 60 Minutes Intravenous Every 24 hours 03/28/19 1853     03/28/19 1415  clindamycin (CLEOCIN) IVPB 600 mg     600 mg 100 mL/hr over 30 Minutes Intravenous  Once 03/28/19 1405 03/28/19 1504   03/28/19 1300  cefTRIAXone (ROCEPHIN) 1 g in sodium chloride 0.9 % 100 mL IVPB     1 g 200 mL/hr over 30 Minutes Intravenous  Once 03/28/19 1256 03/28/19 1437   03/28/19 1300  azithromycin (ZITHROMAX) 500 mg in sodium chloride 0.9 % 250 mL IVPB     500 mg 250 mL/hr over 60 Minutes Intravenous  Once 03/28/19 1256 03/28/19 1519     Subjective: Today, patient denies any chest pain, shortness of breath, fever or chills.  On nasal cannula 2 L/min.  Mild discomfort at the chest tube site.  Denies any nausea vomiting or abdominal pain.  Feels her fatigue and weak.  Feels constipated.  Objective: Vitals:   03/31/19 2021 04/01/19 0501  BP: 114/70 108/63  Pulse: 93 92  Resp: 18 18  Temp: 98.9 F (37.2 C) 98.9 F (37.2 C)  SpO2: 95% 94%    Intake/Output Summary (Last 24 hours) at 04/01/2019 1005 Last data filed at 04/01/2019 0954 Gross per 24 hour  Intake 354.64 ml  Output 1550 ml  Net -1195.36 ml   Filed Weights   03/28/19 1155 03/28/19 1702  Weight: 66 kg 63.5 kg   Body mass index is 27.34 kg/m.   Physical Exam:  General:  Average built, not in obvious  distress, on nasal cannula 2 L/min. HENT: Normocephalic, pupils equally reacting to light and accommodation.  No scleral pallor or icterus noted. Oral mucosa is moist.  Chest: Diminished air entry on the left side.  Left-sided pigtail catheter in place. CVS: S1 &S2 heard. No murmur.  Regular rate and rhythm. Abdomen: Soft, nontender, nondistended.  Bowel sounds are heard.  Extremities: No cyanosis, clubbing or edema.  Peripheral pulses are palpable. Psych: Alert, awake and oriented, normal mood CNS:  No cranial nerve deficits.  Power equal in all extremities.   Skin: Warm and dry.  No rashes noted.   Data Review: I have personally reviewed the following laboratory data and studies,  CBC: Recent Labs  Lab 03/28/19 1206 03/29/19 0437 03/30/19 1147 03/31/19 0546 04/01/19 0521  WBC 14.6* 22.5* 17.5* 16.2* 12.4*  NEUTROABS 12.1*  --   --   --   --   HGB 11.7* 10.7* 10.7* 10.6* 10.1*  HCT 37.0 34.0* 33.8* 34.4* 32.1*  MCV 84.5 84.6  84.7 85.1 83.8  PLT 594* 609* 575* 595* 354*   Basic Metabolic Panel: Recent Labs  Lab 03/28/19 1206 03/28/19 1206 03/28/19 2012 03/29/19 0437 03/30/19 0447 03/30/19 1147 03/31/19 0546 04/01/19 0521  NA 134*  --   --  134*  --  134* 137 141  K 2.8*   < > 2.7* 3.3*  --  2.8* 3.6 3.5  CL 88*  --   --  95*  --  95* 99 102  CO2 30  --   --  30  --  '26 25 28  '$ GLUCOSE 127*  --   --  125*  --  100* 86 122*  BUN 8  --   --  6*  --  9 7* 6*  CREATININE 0.90   < >  --  0.73 0.77 0.64 0.59 0.47  CALCIUM 8.5*  --   --  8.1*  --  8.1* 8.6* 8.4*  MG 1.2*  --   --  2.2  --  2.0 1.9  --   PHOS  --   --   --   --   --   --  2.6  --    < > = values in this interval not displayed.   Liver Function Tests: Recent Labs  Lab 03/28/19 1206 03/29/19 0437 03/31/19 0546  AST 36 27 129*  ALT 30 24 64*  ALKPHOS 166* 139* 137*  BILITOT 0.9 0.6 0.9  PROT 6.6 6.3* 6.0*  ALBUMIN 2.6* 2.3* 2.1*   No results for input(s): LIPASE, AMYLASE in the last 168 hours. No  results for input(s): AMMONIA in the last 168 hours. Cardiac Enzymes: No results for input(s): CKTOTAL, CKMB, CKMBINDEX, TROPONINI in the last 168 hours. BNP (last 3 results) No results for input(s): BNP in the last 8760 hours.  ProBNP (last 3 results) No results for input(s): PROBNP in the last 8760 hours.  CBG: No results for input(s): GLUCAP in the last 168 hours. Recent Results (from the past 240 hour(s))  Blood Culture (routine x 2)     Status: None (Preliminary result)   Collection Time: 03/28/19 12:06 PM   Specimen: BLOOD  Result Value Ref Range Status   Specimen Description   Final    BLOOD RIGHT ANTECUBITAL Performed at [facility], [address] [place], [place] [address]    Special Requests   Final    BOTTLES DRAWN AEROBIC AND ANAEROBIC Blood Culture adequate volume Performed at [facility], [address] [place], [place] [address]    Culture   Final    NO GROWTH 3 DAYS Performed at [facility], [place] [address] [place], [place] [address]    Report Status PENDING  Incomplete  Blood Culture (routine x 2)     Status: None (Preliminary result)   Collection Time: 03/28/19 12:06 PM   Specimen: BLOOD  Result Value Ref Range Status   Specimen Description   Final    BLOOD LEFT ANTECUBITAL Performed at [facility], [address] [place], [place] [address]    Special Requests   Final    BOTTLES DRAWN AEROBIC AND ANAEROBIC Blood Culture adequate volume Performed at [facility], [address] [place], [place] [address]    Culture   Final    NO GROWTH 3 DAYS Performed at [facility], [place] [address] [place], [place] [address]    Report Status PENDING  Incomplete  SARS Coronavirus 2 Ag (30 min TAT) - Nasal Swab (BD Veritor Kit)     Status: None   Collection Time: 03/28/19 12:06 PM   Specimen: Nasal Swab (BD Veritor Kit)  Result Value Ref Range Status   SARS Coronavirus 2 Ag NEGATIVE NEGATIVE Final     Comment: (NOTE) SARS-CoV-2 antigen NOT DETECTED.  Negative results are presumptive.  Negative results do not preclude SARS-CoV-2 infection and should not be used as the sole basis for treatment or other patient management decisions, including infection  control decisions, particularly in the presence of clinical signs and  symptoms consistent with COVID-19, or in those who have been in contact with the virus.  Negative results must be combined with clinical observations, patient history, and epidemiological information. The expected result is Negative. Fact Sheet for Patients: PodPark.tn Fact Sheet for Healthcare Providers: GiftContent.is This test is not yet approved or cleared by the [place] FDA and  has been authorized for detection and/or diagnosis of SARS-CoV-2 by FDA under an Emergency Use Authorization (EUA).  This EUA will remain in effect (meaning this test can be used) for the duration of  the COVID-19 de claration under Section 564(b)(1) of the Act, 21 U.S.C. section 360bbb-3(b)(1), unless the authorization is terminated or revoked sooner. Performed at [facility], [address] [place], [place] [address]   Respiratory Panel by RT PCR (Flu A&B, Covid) - Nasopharyngeal Swab     Status: None   Collection Time: 03/28/19  1:17 PM   Specimen: Nasopharyngeal Swab  Result Value Ref Range Status   SARS Coronavirus 2 by RT PCR NEGATIVE NEGATIVE Final    Comment: (NOTE) SARS-CoV-2 target nucleic acids are NOT DETECTED. The SARS-CoV-2 RNA is generally detectable in upper respiratoy specimens during the acute phase of infection. The lowest concentration of SARS-CoV-2 viral copies this assay can detect is 131 copies/mL. A negative result does not preclude SARS-Cov-2 infection and should not be used as the sole basis for treatment or other patient management decisions. A negative result may occur with    improper specimen collection/handling, submission of specimen other than nasopharyngeal swab, presence of viral mutation(s) within the areas targeted by this assay, and inadequate number of viral copies (<131 copies/mL). A negative result must be combined with clinical observations, patient history, and epidemiological information. The expected result is Negative. Fact Sheet for Patients:  PinkCheek.be Fact Sheet for Healthcare Providers:  GravelBags.it This test is not yet ap proved or cleared by the [place] FDA and  has been authorized for detection and/or diagnosis of SARS-CoV-2 by FDA under an Emergency Use Authorization (EUA). This EUA will remain  in effect (meaning this test can be used) for the duration of the COVID-19 declaration under Section 564(b)(1) of the Act, 21 U.S.C. section 360bbb-3(b)(1), unless the authorization is terminated or revoked sooner.    Influenza A by PCR NEGATIVE NEGATIVE Final   Influenza B by PCR NEGATIVE NEGATIVE Final    Comment: (NOTE) The Xpert Xpress SARS-CoV-2/FLU/RSV assay is intended as an aid in  the diagnosis of influenza from Nasopharyngeal swab specimens and  should not be used as a sole basis for treatment. Nasal washings and  aspirates are unacceptable for Xpert Xpress SARS-CoV-2/FLU/RSV  testing. Fact Sheet for Patients: PinkCheek.be Fact Sheet for Healthcare Providers: GravelBags.it This test is not yet approved or cleared by the [place] FDA and  has been authorized for detection and/or diagnosis of SARS-CoV-2 by  FDA under an Emergency Use  Authorization (EUA). This EUA will remain  in effect (meaning this test can be used) for the duration of the  Covid-19 declaration under Section 564(b)(1) of the Act, 21  U.S.C. section 360bbb-3(b)(1), unless the authorization is  terminated or revoked. Performed at  [facility], [address] [place], [place] [address]   Fungus Culture With Stain     Status: None (Preliminary result)   Collection Time: 03/29/19  3:40 PM   Specimen: PATH Cytology Pleural fluid  Result Value Ref Range Status   Fungus Stain Final report  Final    Comment: (NOTE) Performed At: [facility] [place], [place] [id number] Rush Farmer MD [id number]    Fungus (Mycology) Culture PENDING  Incomplete   Fungal Source PLEURAL  Final    Comment: Performed at [facility], [place] [address], [place], [place] [address]  Culture, body fluid-bottle     Status: None (Preliminary result)   Collection Time: 03/29/19  3:40 PM   Specimen: Pleura  Result Value Ref Range Status   Specimen Description PLEURAL  Final   Special Requests NONE  Final   Culture   Final    NO GROWTH 2 DAYS Performed at [facility] [address], [place], [place] [address]    Report Status PENDING  Incomplete  Gram stain     Status: None   Collection Time: 03/29/19  3:40 PM   Specimen: Pleura  Result Value Ref Range Status   Specimen Description PLEURAL  Final   Special Requests NONE  Final   Gram Stain   Final    MODERATE WBC PRESENT, PREDOMINANTLY PMN NO ORGANISMS SEEN Performed at [facility], [place] [address] [place], [place] [address]    Report Status 03/29/2019 FINAL  Final  Fungus Culture Result     Status: None   Collection Time: 03/29/19  3:40 PM  Result Value Ref Range Status   Result 1 Comment  Final    Comment: (NOTE) KOH/Calcofluor preparation:  no fungus observed. Performed At: [facility] [place], [place] [id number] Rush Farmer MD EE:[id number]      Studies: DG CHEST PORT 1 VIEW  Result Date: 04/01/2019 CLINICAL DATA:  Pleural effusion EXAM: PORTABLE CHEST 1 VIEW COMPARISON:  03/31/2019 FINDINGS: Moderate to large left pleural effusion with apical capping. Associated pigtail  drain. Overall, this is similar to the prior, although increased loculated fluid in the left upper lobe is possible. Associated left lower lobe atelectasis. Right basilar atelectasis. No pneumothorax. The heart is normal in size. IMPRESSION: Moderate to large left pleural effusion with associated left chest drain. No pneumothorax. When compared to the prior, increased loculated fluid in the left upper lobe is possible. Electronically Signed   By: Julian Hy M.D.   On: 04/01/2019 07:05   DG Chest Port 1 View  Result Date: 03/31/2019 CLINICAL DATA:  Follow-up left chest tube EXAM: PORTABLE CHEST 1 VIEW COMPARISON:  03/30/2019 FINDINGS: Moderate to large left pleural effusion with apical capping. Indwelling left pigtail chest drain. Associated left lower lobe compressive atelectasis. Minimal right basilar atelectasis. Right lung is otherwise clear. No pneumothorax. The heart is top-normal in size. IMPRESSION: Moderate to large left pleural effusion. Associated left lower lobe compressive atelectasis. Indwelling left pigtail chest drain. Electronically Signed   By: Julian Hy M.D.   On: 03/31/2019 06:49      Flora Lipps, MD  [facility] 04/01/2019

## 2019-04-01 NOTE — Consult Note (Signed)
Brooke Ellis       [place] [address]             [phone]        [facility] Medical Record #[id number] Date of Birth: August 03, 1950  Referring: Dr Flora Lipps Primary Care: Derinda Late, MD Primary Cardiologist:No primary care provider on file.  Chief Complaint:    Chief Complaint  Patient presents with   Chest Pain    History of Present Illness:     Patient is a 69 year old female, with no previous cardiac or pulmonary history.  Lifelong non-smoker, no history of diabetes.  She has known Barrett's esophagus and on February 16 she underwent upper GI endoscopy with biopsies.  She notes soon after this she began having chills fevers cough and feeling worse.  She has had several courses of antibiotics, but without improvement ultimately was admitted to [facility] 4 days ago with fever and hypoxia.  CT scan showed a complex left pleural effusion and left lung pneumonia.  Incidentally was noted a large hiatal hernia with 50% of the stomach in the right chest.  Patient was started on IV antibiotics, a left chest tube was placed under CT guidance, approximately 400 mL of straw-colored fluid was removed, so far culture negative.  Follow-up radiographs after placement of the tube have not shown much improvement in the effusion and opacity in the left lung.  The patient's respiratory status has improved, she is on oxygen but without any respiratory distress.   Current Activity/ Functional Status: Patient is independent with mobility/ambulation, transfers, ADL's, IADL's.   Zubrod Score: At the time of surgery this patients most appropriate activity status/level should be described as: Prior to this acute admission the patient's symptoms were [x]     0    Normal activity, no symptoms []     1    Restricted in physical strenuous activity but ambulatory, able to do out light work []     2    Ambulatory and capable of self care, unable to do work  activities, up and about                 more than 50%  Of the time                            []     3    Only limited self care, in bed greater than 50% of waking hours []     4    Completely disabled, no self care, confined to bed or chair []     5    Moribund  Past Medical History:  Diagnosis Date   Allergy    seasonal   Anemia    Anxiety    Asthma    Depression    Gallstones    GERD (gastroesophageal reflux disease)    Hearing loss    High cholesterol    Impaired glucose tolerance    Internal hemorrhoids    Osteopenia    Overweight    Urinary tract infection    Vitamin D deficiency     Past Surgical History:  Procedure Laterality Date   CHOLECYSTECTOMY     COLONOSCOPY     FOOT SURGERY     GALLBLADDER SURGERY     POLYPECTOMY     TUBAL LIGATION     UPPER GASTROINTESTINAL ENDOSCOPY      Social History   Tobacco Use  Smoking Status Never Smoker  Smokeless Tobacco Never Used    Social History   Substance and Sexual Activity  Alcohol Use No   Alcohol/week: 0.0 standard drinks     Allergies  Allergen Reactions   Macrodantin     Chemical pneumonia    Morphine And Related     hallucinations    Sulfa Antibiotics Itching and Swelling    Current Facility-Administered Medications  Medication Dose Route Frequency Provider Last Rate Last Admin   0.9 %  sodium chloride infusion   Intravenous PRN Hosie Poisson, MD   Stopped at 04/01/19 1411   azithromycin (ZITHROMAX) 500 mg in sodium chloride 0.9 % 250 mL IVPB  500 mg Intravenous Q24H Hosie Poisson, MD   Stopped at 04/01/19 1407   buPROPion (WELLBUTRIN XL) 24 hr tablet 300 mg  300 mg Oral Daily Hosie Poisson, MD   300 mg at 04/01/19 0948   enoxaparin (LOVENOX) injection 40 mg  40 mg Subcutaneous Q24H Hosie Poisson, MD   40 mg at 03/31/19 2149   FLUoxetine (PROZAC) capsule 10 mg  10 mg Oral Daily Hosie Poisson, MD   10 mg at 04/01/19 0948   ibuprofen (ADVIL) tablet 400 mg  400 mg Oral  Q6H PRN Pokhrel, Laxman, MD   400 mg at 04/01/19 1603   influenza vaccine adjuvanted (FLUAD) injection 0.5 mL  0.5 mL Intramuscular Tomorrow-1000 Hosie Poisson, MD       ketotifen (ZADITOR) 0.025 % ophthalmic solution 1 drop  1 drop Both Eyes Daily PRN Hosie Poisson, MD       montelukast (SINGULAIR) tablet 10 mg  10 mg Oral Daily Hosie Poisson, MD   10 mg at 04/01/19 0948   ondansetron (ZOFRAN) injection 4 mg  4 mg Intravenous Q6H PRN Hosie Poisson, MD       pantoprazole (PROTONIX) EC tablet 40 mg  40 mg Oral BID Hosie Poisson, MD   40 mg at 04/01/19 0948   piperacillin-tazobactam (ZOSYN) IVPB 3.375 g  3.375 g Intravenous Q8H Hosie Poisson, MD 12.5 mL/hr at 04/01/19 1800 Rate Verify at 04/01/19 1800   potassium chloride SA (KLOR-CON) CR tablet 40 mEq  40 mEq Oral BID Pokhrel, Laxman, MD   40 mEq at 04/01/19 0948   vancomycin (VANCOCIN) IVPB 1000 mg/200 mL premix  1,000 mg Intravenous Q24H Angela Adam, RPH 200 mL/hr at 04/01/19 1821 1,000 mg at 04/01/19 1821    Medications Prior to Admission  Medication Sig Dispense Refill Last Dose   buPROPion (WELLBUTRIN XL) 300 MG 24 hr tablet Take 300 mg by mouth daily.     03/27/2019 at Unknown time   Cholecalciferol (VITAMIN D) 2000 UNITS tablet Take 2,000 Units by mouth daily.   03/27/2019 at Unknown time   ezetimibe (ZETIA) 10 MG tablet Take 10 mg by mouth every evening.    03/27/2019 at Unknown time   FLUoxetine (PROZAC) 10 MG capsule Take 10 mg by mouth daily.     03/27/2019 at Unknown time   ketotifen (ZADITOR) 0.025 % ophthalmic solution Place 1 drop into both eyes daily as needed. For seasonal allergies    unknown   levofloxacin (LEVAQUIN) 500 MG tablet Take 500 mg by mouth daily.   03/28/2019 at Unknown time   metFORMIN (GLUCOPHAGE-XR) 500 MG 24 hr tablet Take 500 mg by mouth daily. Reported on 05/13/2015   Past Week at Unknown time   montelukast (SINGULAIR) 10 MG tablet Take 10 mg by mouth daily.    03/27/2019 at Unknown time  omeprazole (PRILOSEC) 40 MG capsule Take 1 tablet 30 minutes before breakfast and 1 tablet 30 minutes before supper 60 capsule 11 03/27/2019 at Unknown time   ondansetron (ZOFRAN-ODT) 4 MG disintegrating tablet Take 4 mg by mouth every 8 (eight) hours as needed for nausea or vomiting.    03/28/2019 at Unknown time   rosuvastatin (CRESTOR) 40 MG tablet Take 40 mg by mouth daily.   03/27/2019 at Unknown time    Family History  Problem Relation Age of Onset   Crohn's disease Daughter    Diverticulitis Sister    Colon cancer Neg Hx    Gallbladder disease Neg Hx    Esophageal cancer Neg Hx    Pancreatic cancer Neg Hx    Rectal cancer Neg Hx    Stomach cancer Neg Hx    Colon polyps Neg Hx      Review of Systems:   Pertinent items are noted in HPI.     Cardiac Review of Systems: Y or  [    ]= no  Chest Pain [  n  ]  Resting SOB [ y  ] Exertional SOB  [ y ]  Orthopnea Blue.Reese  ]   Pedal Edema [  n ]    Palpitations [n  ] Syncope  [  n]   Presyncope [ n  ]  General Review of Systems: [Y] = yes [  ]=no Constitional: recent weight change [  ]; anorexia [  ]; fatigue [  ]; nausea [  ]; night sweats [  ]; fever Blue.Reese  ]; or chills Blue.Reese  ]                                                               Dental: Last Dentist visit:   Eye : blurred vision [  ]; diplopia [   ]; vision changes [  ];  Amaurosis fugax[  ]; Resp: cough [  ];  wheezing[  ];  hemoptysis[  ]; shortness of breath[  ]; paroxysmal nocturnal dyspnea[  ]; dyspnea on exertion[  ]; or orthopnea[  ];  GI:  gallstones[  ], vomiting[  ];  dysphagia[  ]; melena[  ];  hematochezia [  ]; heartburn[ y ];   Hx of  Colonoscopy[y  ]; GU: kidney stones [  ]; hematuria[  ];   dysuria [  ];  nocturia[  ];  history of     obstruction [  ]; urinary frequency [  ]             Skin: rash, swelling[  ];, hair loss[  ];  peripheral edema[  ];  or itching[  ]; Musculosketetal: myalgias[  ];  joint swelling[  ];  joint erythema[  ];  joint pain[  ];  back  pain[  ];  Heme/Lymph: bruising[  ];  bleeding[  ];  anemia[  ];  Neuro: TIA[  ];  headaches[  ];  stroke[  ];  vertigo[  ];  seizures[  ];   paresthesias[  ];  difficulty walking[  ];  Psych:depression[  ]; anxiety[  ];  Endocrine: diabetes[n  ];  thyroid dysfunction[  ];                 Physical Exam: BP  103/68    Pulse 91    Temp 98.5 F (36.9 C) (Oral)    Resp 18    Ht 5' (1.524 m)    Wt 63.5 kg    SpO2 94%    BMI 27.34 kg/m    General appearance: alert, cooperative and appears stated age Neck: no adenopathy, no carotid bruit, no JVD, supple, symmetrical, trachea midline and thyroid not enlarged, symmetric, no tenderness/mass/nodules Lymph nodes: Cervical, supraclavicular, and axillary nodes normal. Resp: diminished breath sounds LLL and LUL Cardio: regular rate and rhythm, S1, S2 normal, no murmur, click, rub or gallop GI: soft, non-tender; bowel sounds normal; no masses,  no organomegaly Extremities: extremities normal, atraumatic, no cyanosis or edema and Homans sign is negative, no sign of DVT  Diagnostic Studies & Laboratory data:     Recent Radiology Findings:   CT Angio Chest PE W and/or Wo Contrast  Result Date: 03/28/2019 CLINICAL DATA:  PE suspected, high prob. Shortness of breath for 2 weeks. Chest pain. EXAM: CT ANGIOGRAPHY CHEST WITH CONTRAST TECHNIQUE: Multidetector CT imaging of the chest was performed using the standard protocol during bolus administration of intravenous contrast. Multiplanar CT image reconstructions and MIPs were obtained to evaluate the vascular anatomy. CONTRAST:  71mL OMNIPAQUE IOHEXOL 350 MG/ML SOLN COMPARISON:  Chest radiograph earlier this day demonstrating left pleural effusion. FINDINGS: Cardiovascular: There are no filling defects within the pulmonary arteries to suggest pulmonary embolus. Evaluation is diagnostic to the segmental levels. Cannot assess subsegmental branches due to contrast bolus timing. Thoracic aorta is normal in caliber. No  aortic dissection. Heart is normal in size, displaced anteriorly by a large hiatal hernia. No pericardial effusion. Mediastinum/Nodes: Large hiatal hernia, greater than 50% of the stomach is intrathoracic. Mid esophagus is patulous and contains small air-fluid level. Soft tissue density in the left hilum that is subcentimeter, likely represents prominent hilar lymph nodes. There is no right hilar adenopathy. Prominent AP window nodes at 7 and 8 mm. Lobulated fluid/soft tissue density in the anterior mediastinum that is ill-defined and contiguous with loculated pleural fluid, difficult to exclude underlying lymph nodes, for example series 10, images 78 and 90. Lungs/Pleura: Moderate size left pleural effusion is partially loculated. There is lobulated fluid in the inter lobar fissure. Ill-defined lingular consolidation has central low density with focus of air suspicious for necrosis, series 4 and 5 image 44. Consolidation in the left lower lobe and dependent left upper lobe likely represents compressive atelectasis. No right pleural effusion. Compressive atelectasis in the right lower lobe related to hiatal hernia as well as dependently. Upper Abdomen: Post cholecystectomy. No acute findings. Large hiatal hernia with greater than 50% of the stomach intrathoracic. Musculoskeletal: Chronic T11 compression fracture when compared with lumbar spine MRI 03/23/2018 no acute osseous abnormality or evidence of focal bone lesion. Degenerative change of both shoulders. Review of the MIP images confirms the above findings. IMPRESSION: 1. No pulmonary embolus to the segmental level. 2. Heterogeneous lingular consolidation with ill-defined low-density and focus of air suspicious for necrotic process. Differential considerations include necrotic mass versus abscess/pneumonia. Moderate size left pleural effusion which is partially loculated, with fluid tracking into the interlobar fissure. Recommend fluid and/or tissue sampling. 3.  Consolidation in the left lower lobe and dependent left upper lobe likely represents compressive atelectasis related to pleural effusion, however infectious etiology/pneumonia is also considered. 4. Large hiatal hernia with greater than 50% of the stomach intrathoracic. Patulous mid esophagus containing air-fluid level. 5. Prominent AP window and left hilar nodes  are likely reactive. Additional density along the left upper mediastinum felt to be related to loculated pleural fluid rather than enlarged lymph nodes, however recommend attention on follow-up. Electronically Signed   By: Keith Rake M.D.   On: 03/28/2019 13:56   DG CHEST PORT 1 VIEW  Result Date: 04/01/2019 CLINICAL DATA:  Pleural effusion EXAM: PORTABLE CHEST 1 VIEW COMPARISON:  03/31/2019 FINDINGS: Moderate to large left pleural effusion with apical capping. Associated pigtail drain. Overall, this is similar to the prior, although increased loculated fluid in the left upper lobe is possible. Associated left lower lobe atelectasis. Right basilar atelectasis. No pneumothorax. The heart is normal in size. IMPRESSION: Moderate to large left pleural effusion with associated left chest drain. No pneumothorax. When compared to the prior, increased loculated fluid in the left upper lobe is possible. Electronically Signed   By: Julian Hy M.D.   On: 04/01/2019 07:05   DG Chest Port 1 View  Result Date: 03/31/2019 CLINICAL DATA:  Follow-up left chest tube EXAM: PORTABLE CHEST 1 VIEW COMPARISON:  03/30/2019 FINDINGS: Moderate to large left pleural effusion with apical capping. Indwelling left pigtail chest drain. Associated left lower lobe compressive atelectasis. Minimal right basilar atelectasis. Right lung is otherwise clear. No pneumothorax. The heart is top-normal in size. IMPRESSION: Moderate to large left pleural effusion. Associated left lower lobe compressive atelectasis. Indwelling left pigtail chest drain. Electronically Signed   By:  Julian Hy M.D.   On: 03/31/2019 06:49   DG Chest Port 1 View  Result Date: 03/30/2019 CLINICAL DATA:  Chest tube placement. EXAM: PORTABLE CHEST 1 VIEW COMPARISON:  Chest x-ray 03/28/2019 FINDINGS: There is a left-sided Cope loop type pleural drainage catheter noted on the left side. Persistent moderate to large complex left pleural fluid collection with new pleural fluid noted along the left lung apex. Stable mild rightward deviation of the trachea somewhat accentuated by the rotation of the patient. Streaky right basilar atelectasis is noted. Remote healed rib fractures. IMPRESSION: Left-sided Cope loop type pleural drainage catheter in place but persistent large complex left pleural fluid collection with new pleural fluid noted over the left apex. Electronically Signed   By: Marijo Sanes M.D.   On: 03/30/2019 07:31   DG Chest Port 1 View  Result Date: 03/28/2019 CLINICAL DATA:  Shortness of breath and chest pain EXAM: PORTABLE CHEST 1 VIEW COMPARISON:  10/26/2010 FINDINGS: Cardiac silhouette is partially obscured but appears mildly enlarged. Moderate left-sided pleural effusion with associated left basilar opacity. Streaky right basilar opacity with probable trace right pleural effusion. No pneumothorax. IMPRESSION: 1. Moderate left-sided pleural effusion with associated left basilar opacity, which may reflect atelectasis and/or pneumonia. 2. Streaky right basilar opacity with probable trace right pleural effusion. Electronically Signed   By: Davina Poke D.O.   On: 03/28/2019 12:42   CT IMAGE GUIDED FLUID DRAIN BY CATHETER  Result Date: 03/29/2019 INDICATION: Left-sided pneumonia with loculated left pleural fluid collection suspicious for empyema. EXAM: CT-GUIDED LEFT PLEURAL DRAINAGE CATHETER PLACEMENT MEDICATIONS: The patient is currently admitted to the hospital and receiving intravenous antibiotics. The antibiotics were administered within an appropriate time frame prior to the  initiation of the procedure. ANESTHESIA/SEDATION: Fentanyl 100 mcg IV; Versed 4.0 mg IV Moderate Sedation Time:  28 minutes. The patient was continuously monitored during the procedure by the interventional radiology nurse under my direct supervision. COMPLICATIONS: None immediate. PROCEDURE: Informed written consent was obtained from the patient after a thorough discussion of the procedural risks, benefits and alternatives. All  questions were addressed. Maximal Sterile Barrier Technique was utilized including caps, mask, sterile gowns, sterile gloves, sterile drape, hand hygiene and skin antiseptic. A timeout was performed prior to the initiation of the procedure. CT was performed through the chest in a supine position with the left side rolled up slightly. From a lateral approach, an 18 gauge trocar needle was advanced under CT guidance into the posterolateral left pleural space. After return of fluid, a guidewire was advanced into the pleural space. The tract was dilated and a 12 French pigtail drainage catheter advanced. Catheter position was confirmed by CT. The catheter was aspirated to obtain fluid samples for requested laboratory testing. The catheter was connected to a [employer] device. It was secured at the skin with a Prolene retention suture and Vaseline gauze dressing. FINDINGS: Aspiration of the loculated left pleural effusion yielded thin yellow fluid. After chest tube placement, the catheter is positioned in the posterior pleural space. There is good return of fluid from the drainage catheter. IMPRESSION: CT-guided placement of 12 French pigtail pleural drainage catheter into the posterior aspect of a loculated left pleural effusion/empyema. The drainage catheter was attached to a Pleur-evac device which will be attached to wall suction at -20 cm of water. Electronically Signed   By: Aletta Edouard M.D.   On: 03/29/2019 16:47     I have independently reviewed the above radiologic studies  and discussed with the patient   Recent Lab Findings: Lab Results  Component Value Date   WBC 12.4 (H) 04/01/2019   HGB 10.1 (L) 04/01/2019   HCT 32.1 (L) 04/01/2019   PLT 583 (H) 04/01/2019   GLUCOSE 122 (H) 04/01/2019   TRIG 72 03/28/2019   ALT 64 (H) 03/31/2019   AST 129 (H) 03/31/2019   NA 141 04/01/2019   K 3.5 04/01/2019   CL 102 04/01/2019   CREATININE 0.47 04/01/2019   BUN 6 (L) 04/01/2019   CO2 28 04/01/2019   INR 1.3 (H) 03/29/2019      Assessment / Plan:   #1 complex left pleural effusion complicating left lung pneumonia-without much improvement in effusion with chest tube drainage-I discussed with the patient that because of the complex nature of the effusion and its lack of fully being drained with chest tube that we should proceed with transfer to [facility] with bronchoscopy left video-assisted thoracoscopy decortication tomorrow-risks and options are discussed with the patient and her husband who was present-in addition we discussed with her son who is a physician at the [employer].  Their questions have been answered and she is willing to proceed.  #2 large hiatal hernia-chronic in nature, with reflux symptoms and history of Barrett's esophagus     I  spent 60 minutes counseling the patient face to face and reviewing previous history including endoscopy and imaging.    [name] MD      [address] [place] [address] Office [phone]     04/01/2019 7:03 PM

## 2019-04-01 NOTE — Progress Notes (Signed)
Pharmacy Antibiotic Note  Brooke Ellis is a 69 y.o. female admitted on 03/28/2019 with lung abscess.  Pharmacy has been consulted for vancomycin dosing.  Today, 04/01/2019:  D5 abx  WBC elevated but trending down  SCr improved to baseline  Afebrile x48 hr  Plan:  Continue vancomycin 1000 mg IV q24 hr (AUC 545, SCr 0.9, Vd 0.72)  Patient not improved, no indication of ability to narrow; will check levels tomorrow  Also on Zosyn 3.375 gm IV q8h and Azithromycin 500 mg IV daily per MD  F/u renal function, WBC, temp, culture data  Vancomycin levels as needed  Daily SCr while on V/Z combo   Height: 5' (152.4 cm) Weight: 140 lb (63.5 kg) IBW/kg (Calculated) : 45.5  Temp (24hrs), Avg:98.9 F (37.2 C), Min:98.9 F (37.2 C), Max:98.9 F (37.2 C)  Recent Labs  Lab 03/28/19 1206 03/28/19 1206 03/28/19 1402 03/29/19 0437 03/30/19 0447 03/30/19 1147 03/31/19 0546 04/01/19 0521  WBC 14.6*  --   --  22.5*  --  17.5* 16.2* 12.4*  CREATININE 0.90   < >  --  0.73 0.77 0.64 0.59 0.47  LATICACIDVEN 1.9  --  1.1  --   --   --   --   --    < > = values in this interval not displayed.    Estimated Creatinine Clearance: 56 mL/min (by C-G formula based on SCr of 0.47 mg/dL).    Allergies  Allergen Reactions  . Macrodantin     Chemical pneumonia   . Morphine And Related     hallucinations   . Sulfa Antibiotics Itching and Swelling    Antimicrobials this admission: 3/10 azith >> 3/10 CTX x 1 3/10 clinda x 1 3/10 vanc >> 3/10 ZEI >>  Dose adjustments this admission:  Microbiology results: 3/10 covid/flu neg 3/10 BCx2: ngtd 3/11 Pleural fluid: ngtd (nothing on Gm stain) - fungal Cx pending (KOH prep neg)   Thank you for allowing pharmacy to be a part of this patient's care.  Reuel Boom, PharmD, BCPS [phone] 04/01/2019, 11:02 AM

## 2019-04-02 ENCOUNTER — Inpatient Hospital Stay (HOSPITAL_COMMUNITY): Payer: Medicare Other

## 2019-04-02 ENCOUNTER — Encounter (HOSPITAL_COMMUNITY)
Admission: EM | Disposition: A | Discharge status: Home / Self Care | Payer: Self-pay | Source: Home / Self Care | Attending: Student

## 2019-04-02 ENCOUNTER — Inpatient Hospital Stay (HOSPITAL_COMMUNITY): Payer: Medicare Other | Admitting: Certified Registered Nurse Anesthetist

## 2019-04-02 DIAGNOSIS — D509 Iron deficiency anemia, unspecified: Secondary | ICD-10-CM

## 2019-04-02 DIAGNOSIS — J869 Pyothorax without fistula: Secondary | ICD-10-CM

## 2019-04-02 HISTORY — PX: VIDEO BRONCHOSCOPY: SHX5072

## 2019-04-02 HISTORY — PX: VIDEO ASSISTED THORACOSCOPY (VATS)/DECORTICATION: SHX6171

## 2019-04-02 LAB — URINALYSIS, COMPLETE (UACMP) WITH MICROSCOPIC
Bilirubin Urine: NEGATIVE
Glucose, UA: NEGATIVE mg/dL
Hgb urine dipstick: NEGATIVE
Ketones, ur: 5 mg/dL — AB
Nitrite: NEGATIVE
Protein, ur: 30 mg/dL — AB
Specific Gravity, Urine: 1.019 (ref 1.005–1.030)
pH: 6 (ref 5.0–8.0)

## 2019-04-02 LAB — PROTIME-INR
INR: 1.2 (ref 0.8–1.2)
Prothrombin Time: 15.4 seconds — ABNORMAL HIGH (ref 11.4–15.2)

## 2019-04-02 LAB — CBC
HCT: 31 % — ABNORMAL LOW (ref 36.0–46.0)
Hemoglobin: 9.8 g/dL — ABNORMAL LOW (ref 12.0–15.0)
MCH: 26.5 pg (ref 26.0–34.0)
MCHC: 31.6 g/dL (ref 30.0–36.0)
MCV: 83.8 fL (ref 80.0–100.0)
Platelets: 810 10*3/uL — ABNORMAL HIGH (ref 150–400)
RBC: 3.7 MIL/uL — ABNORMAL LOW (ref 3.87–5.11)
RDW: 13.9 % (ref 11.5–15.5)
WBC: 11.8 10*3/uL — ABNORMAL HIGH (ref 4.0–10.5)
nRBC: 0.2 % (ref 0.0–0.2)

## 2019-04-02 LAB — TYPE AND SCREEN
ABO/RH(D): A POS
Antibody Screen: NEGATIVE

## 2019-04-02 LAB — COMPREHENSIVE METABOLIC PANEL
ALT: 62 U/L — ABNORMAL HIGH (ref 0–44)
AST: 82 U/L — ABNORMAL HIGH (ref 15–41)
Albumin: 1.7 g/dL — ABNORMAL LOW (ref 3.5–5.0)
Alkaline Phosphatase: 146 U/L — ABNORMAL HIGH (ref 38–126)
Anion gap: 12 (ref 5–15)
BUN: <5 mg/dL — ABNORMAL LOW (ref 8–23)
CO2: 24 mmol/L (ref 22–32)
Calcium: 8.3 mg/dL — ABNORMAL LOW (ref 8.9–10.3)
Chloride: 104 mmol/L (ref 98–111)
Creatinine, Ser: 0.72 mg/dL (ref 0.44–1.00)
GFR calc Af Amer: >60 mL/min (ref >60)
GFR calc non Af Amer: >60 mL/min (ref >60)
Glucose, Bld: 98 mg/dL (ref 70–99)
Potassium: 3.8 mmol/L (ref 3.5–5.1)
Sodium: 140 mmol/L (ref 135–145)
Total Bilirubin: 0.4 mg/dL (ref 0.3–1.2)
Total Protein: 5.7 g/dL — ABNORMAL LOW (ref 6.5–8.1)

## 2019-04-02 LAB — CULTURE, BLOOD (ROUTINE X 2)
Culture: NO GROWTH
Culture: NO GROWTH
Special Requests: ADEQUATE
Special Requests: ADEQUATE

## 2019-04-02 LAB — APTT: aPTT: 34 seconds (ref 24–36)

## 2019-04-02 LAB — MRSA PCR SCREENING: MRSA by PCR: NEGATIVE

## 2019-04-02 LAB — ABO/RH: ABO/RH(D): A POS

## 2019-04-02 SURGERY — BRONCHOSCOPY, VIDEO-ASSISTED
Anesthesia: General | Site: Chest

## 2019-04-02 MED ORDER — HYDROMORPHONE HCL 1 MG/ML IJ SOLN: 0.2500 mg | INTRAMUSCULAR | Status: DC | PRN

## 2019-04-02 MED ORDER — MIDAZOLAM HCL 2 MG/2ML IJ SOLN
INTRAMUSCULAR | Status: AC
  Filled 2019-04-02: qty 2

## 2019-04-02 MED ORDER — CHLORHEXIDINE GLUCONATE CLOTH 2 % EX PADS
6.0000 | MEDICATED_PAD | Freq: Every day | CUTANEOUS | Status: DC
  Administered 2019-04-04 – 2019-04-06 (×3): 6 via TOPICAL

## 2019-04-02 MED ORDER — PROPOFOL 1000 MG/100ML IV EMUL
INTRAVENOUS | Status: AC
  Filled 2019-04-02: qty 100

## 2019-04-02 MED ORDER — DIPHENHYDRAMINE HCL 12.5 MG/5ML PO ELIX: 12.5000 mg | ORAL_SOLUTION | Freq: Four times a day (QID) | ORAL | Status: DC | PRN

## 2019-04-02 MED ORDER — FENTANYL CITRATE (PF) 250 MCG/5ML IJ SOLN
INTRAMUSCULAR | Status: DC | PRN
  Administered 2019-04-02: 150 ug via INTRAVENOUS
  Administered 2019-04-02 (×2): 50 ug via INTRAVENOUS

## 2019-04-02 MED ORDER — NALOXONE HCL 0.4 MG/ML IJ SOLN: 0.4000 mg | INTRAMUSCULAR | Status: DC | PRN

## 2019-04-02 MED ORDER — ROCURONIUM BROMIDE 10 MG/ML (PF) SYRINGE
PREFILLED_SYRINGE | INTRAVENOUS | Status: AC
  Filled 2019-04-02: qty 10

## 2019-04-02 MED ORDER — 0.9 % SODIUM CHLORIDE (POUR BTL) OPTIME
TOPICAL | Status: DC | PRN
  Administered 2019-04-02: 3000 mL

## 2019-04-02 MED ORDER — DEXTROSE-NACL 5-0.45 % IV SOLN: INTRAVENOUS | Status: DC

## 2019-04-02 MED ORDER — EPHEDRINE 5 MG/ML INJ
INTRAVENOUS | Status: AC
  Filled 2019-04-02: qty 10

## 2019-04-02 MED ORDER — ACETAMINOPHEN 10 MG/ML IV SOLN: 1000.0000 mg | Freq: Once | INTRAVENOUS | Status: DC | PRN

## 2019-04-02 MED ORDER — PROPOFOL 10 MG/ML IV BOLUS
INTRAVENOUS | Status: DC | PRN
  Administered 2019-04-02: 200 mg via INTRAVENOUS

## 2019-04-02 MED ORDER — SENNOSIDES-DOCUSATE SODIUM 8.6-50 MG PO TABS
1.0000 | ORAL_TABLET | Freq: Every day | ORAL | Status: DC
  Administered 2019-04-02 – 2019-04-03 (×2): 1 via ORAL
  Filled 2019-04-02 (×4): qty 1

## 2019-04-02 MED ORDER — LACTATED RINGERS IV SOLN: INTRAVENOUS | Status: DC

## 2019-04-02 MED ORDER — PIPERACILLIN-TAZOBACTAM 3.375 G IVPB 30 MIN
3.3750 g | INTRAVENOUS | Status: AC
  Administered 2019-04-02: 3.375 g via INTRAVENOUS
  Filled 2019-04-02: qty 50

## 2019-04-02 MED ORDER — SUGAMMADEX SODIUM 200 MG/2ML IV SOLN
INTRAVENOUS | Status: DC | PRN
  Administered 2019-04-02: 180 mg via INTRAVENOUS

## 2019-04-02 MED ORDER — SODIUM CHLORIDE 0.9% FLUSH: 9.0000 mL | INTRAVENOUS | Status: DC | PRN

## 2019-04-02 MED ORDER — DEXAMETHASONE SODIUM PHOSPHATE 10 MG/ML IJ SOLN
INTRAMUSCULAR | Status: AC
  Filled 2019-04-02: qty 1

## 2019-04-02 MED ORDER — ONDANSETRON HCL 4 MG/2ML IJ SOLN: 4.0000 mg | Freq: Four times a day (QID) | INTRAMUSCULAR | Status: DC | PRN

## 2019-04-02 MED ORDER — ACETAMINOPHEN 160 MG/5ML PO SOLN
1000.0000 mg | Freq: Four times a day (QID) | ORAL | Status: DC
  Administered 2019-04-06: 1000 mg via ORAL

## 2019-04-02 MED ORDER — FLUOXETINE HCL 10 MG PO CAPS
10.0000 mg | ORAL_CAPSULE | Freq: Every day | ORAL | Status: DC
  Administered 2019-04-03 – 2019-04-07 (×5): 10 mg via ORAL
  Filled 2019-04-02 (×5): qty 1

## 2019-04-02 MED ORDER — BUPROPION HCL ER (XL) 300 MG PO TB24
300.0000 mg | ORAL_TABLET | Freq: Every day | ORAL | Status: DC
  Administered 2019-04-03 – 2019-04-07 (×5): 300 mg via ORAL
  Filled 2019-04-02 (×5): qty 1

## 2019-04-02 MED ORDER — LIDOCAINE 2% (20 MG/ML) 5 ML SYRINGE
INTRAMUSCULAR | Status: DC | PRN
  Administered 2019-04-02: 60 mg via INTRAVENOUS

## 2019-04-02 MED ORDER — BISACODYL 5 MG PO TBEC
10.0000 mg | DELAYED_RELEASE_TABLET | Freq: Every day | ORAL | Status: DC
  Administered 2019-04-02 – 2019-04-07 (×5): 10 mg via ORAL
  Filled 2019-04-02 (×6): qty 2

## 2019-04-02 MED ORDER — METOCLOPRAMIDE HCL 5 MG/ML IJ SOLN
10.0000 mg | Freq: Four times a day (QID) | INTRAMUSCULAR | Status: AC
  Administered 2019-04-02 – 2019-04-03 (×4): 10 mg via INTRAVENOUS
  Filled 2019-04-02 (×4): qty 2

## 2019-04-02 MED ORDER — KETOROLAC TROMETHAMINE 15 MG/ML IJ SOLN
15.0000 mg | Freq: Four times a day (QID) | INTRAMUSCULAR | Status: DC | PRN
  Administered 2019-04-02: 15 mg via INTRAVENOUS
  Filled 2019-04-02: qty 1

## 2019-04-02 MED ORDER — ONDANSETRON HCL 4 MG/2ML IJ SOLN
INTRAMUSCULAR | Status: DC | PRN
  Administered 2019-04-02: 4 mg via INTRAVENOUS

## 2019-04-02 MED ORDER — DIPHENHYDRAMINE HCL 50 MG/ML IJ SOLN: 12.5000 mg | Freq: Four times a day (QID) | INTRAMUSCULAR | Status: DC | PRN

## 2019-04-02 MED ORDER — LACTATED RINGERS IV SOLN: INTRAVENOUS | Status: DC | PRN

## 2019-04-02 MED ORDER — ACETAMINOPHEN 500 MG PO TABS
1000.0000 mg | ORAL_TABLET | Freq: Four times a day (QID) | ORAL | Status: DC
  Administered 2019-04-02 – 2019-04-07 (×15): 1000 mg via ORAL
  Filled 2019-04-02 (×16): qty 2

## 2019-04-02 MED ORDER — SODIUM CHLORIDE 0.9 % IV SOLN
3.0000 g | Freq: Four times a day (QID) | INTRAVENOUS | Status: DC
  Administered 2019-04-02 – 2019-04-06 (×15): 3 g via INTRAVENOUS
  Filled 2019-04-02 (×6): qty 3
  Filled 2019-04-02: qty 8
  Filled 2019-04-02: qty 3
  Filled 2019-04-02 (×2): qty 8
  Filled 2019-04-02: qty 3
  Filled 2019-04-02: qty 8
  Filled 2019-04-02: qty 3
  Filled 2019-04-02: qty 8
  Filled 2019-04-02 (×5): qty 3

## 2019-04-02 MED ORDER — DEXAMETHASONE SODIUM PHOSPHATE 10 MG/ML IJ SOLN
INTRAMUSCULAR | Status: DC | PRN
  Administered 2019-04-02: 10 mg via INTRAVENOUS

## 2019-04-02 MED ORDER — PANTOPRAZOLE SODIUM 40 MG PO TBEC
40.0000 mg | DELAYED_RELEASE_TABLET | Freq: Two times a day (BID) | ORAL | Status: DC
  Administered 2019-04-02 – 2019-04-07 (×10): 40 mg via ORAL
  Filled 2019-04-02 (×10): qty 1

## 2019-04-02 MED ORDER — ONDANSETRON HCL 4 MG/2ML IJ SOLN
4.0000 mg | Freq: Four times a day (QID) | INTRAMUSCULAR | Status: DC | PRN
  Administered 2019-04-05: 4 mg via INTRAVENOUS
  Filled 2019-04-02: qty 2

## 2019-04-02 MED ORDER — VANCOMYCIN HCL 1000 MG IV SOLR
INTRAVENOUS | Status: AC
  Filled 2019-04-02: qty 1000

## 2019-04-02 MED ORDER — OXYCODONE HCL 5 MG PO TABS: 5.0000 mg | ORAL_TABLET | ORAL | Status: DC | PRN

## 2019-04-02 MED ORDER — FENTANYL CITRATE (PF) 250 MCG/5ML IJ SOLN
INTRAMUSCULAR | Status: AC
  Filled 2019-04-02: qty 5

## 2019-04-02 MED ORDER — VANCOMYCIN HCL 750 MG/150ML IV SOLN
750.0000 mg | Freq: Two times a day (BID) | INTRAVENOUS | Status: DC
  Filled 2019-04-02: qty 150

## 2019-04-02 MED ORDER — PROMETHAZINE HCL 25 MG/ML IJ SOLN: 6.2500 mg | INTRAMUSCULAR | Status: DC | PRN

## 2019-04-02 MED ORDER — MIDAZOLAM HCL 2 MG/2ML IJ SOLN
INTRAMUSCULAR | Status: DC | PRN
  Administered 2019-04-02: 2 mg via INTRAVENOUS

## 2019-04-02 MED ORDER — SODIUM CHLORIDE 0.9 % IV SOLN: INTRAVENOUS | Status: DC | PRN

## 2019-04-02 MED ORDER — VANCOMYCIN HCL IN DEXTROSE 1-5 GM/200ML-% IV SOLN
INTRAVENOUS | Status: AC
  Filled 2019-04-02: qty 200

## 2019-04-02 MED ORDER — LIDOCAINE 2% (20 MG/ML) 5 ML SYRINGE
INTRAMUSCULAR | Status: AC
  Filled 2019-04-02: qty 10

## 2019-04-02 MED ORDER — TRAMADOL HCL 50 MG PO TABS
50.0000 mg | ORAL_TABLET | Freq: Four times a day (QID) | ORAL | Status: DC | PRN
  Administered 2019-04-02 – 2019-04-05 (×5): 100 mg via ORAL
  Filled 2019-04-02 (×5): qty 2

## 2019-04-02 MED ORDER — ROCURONIUM BROMIDE 10 MG/ML (PF) SYRINGE
PREFILLED_SYRINGE | INTRAVENOUS | Status: DC | PRN
  Administered 2019-04-02: 20 mg via INTRAVENOUS
  Administered 2019-04-02: 40 mg via INTRAVENOUS
  Administered 2019-04-02: 20 mg via INTRAVENOUS

## 2019-04-02 MED ORDER — FENTANYL 50 MCG/ML IV PCA SOLN
INTRAVENOUS | Status: DC
  Administered 2019-04-02: 70 ug via INTRAVENOUS
  Administered 2019-04-02: 100 ug via INTRAVENOUS
  Administered 2019-04-03: 10 ug via INTRAVENOUS
  Administered 2019-04-03: 80 ug via INTRAVENOUS
  Administered 2019-04-03 (×2): 10 ug via INTRAVENOUS
  Administered 2019-04-04 (×2): 0 ug via INTRAVENOUS
  Administered 2019-04-04: 30 ug via INTRAVENOUS
  Administered 2019-04-05 – 2019-04-06 (×4): 0 ug via INTRAVENOUS
  Filled 2019-04-02: qty 20

## 2019-04-02 SURGICAL SUPPLY — 100 items
ADAPTER VALVE BIOPSY EBUS (MISCELLANEOUS) IMPLANT
ADH SKN CLS APL DERMABOND .7 (GAUZE/BANDAGES/DRESSINGS) ×2
ADPTR VALVE BIOPSY EBUS (MISCELLANEOUS)
APL SRG 22X2 LUM MLBL SLNT (VASCULAR PRODUCTS)
APL SRG 7X2 LUM MLBL SLNT (VASCULAR PRODUCTS)
APPLICATOR TIP COSEAL (VASCULAR PRODUCTS) IMPLANT
APPLICATOR TIP EXT COSEAL (VASCULAR PRODUCTS) IMPLANT
BLADE 10 SAFETY STRL DISP (BLADE) ×1 IMPLANT
BLADE CLIPPER SURG (BLADE) ×3 IMPLANT
BLADE SURG 11 STRL SS (BLADE) IMPLANT
BRUSH CYTOL CELLEBRITY 1.5X140 (MISCELLANEOUS) IMPLANT
CANISTER SUCT 3000ML PPV (MISCELLANEOUS) ×3 IMPLANT
CATH KIT ON-Q SILVERSOAK 5 (CATHETERS) IMPLANT
CATH KIT ON-Q SILVERSOAK 5IN (CATHETERS) IMPLANT
CATH THORACIC 28FR (CATHETERS) IMPLANT
CATH THORACIC 36FR (CATHETERS) IMPLANT
CATH THORACIC 36FR RT ANG (CATHETERS) IMPLANT
CLEANER TIP ELECTROSURG 2X2 (MISCELLANEOUS) ×3 IMPLANT
CLIP VESOCCLUDE MED 6/CT (CLIP) IMPLANT
CNTNR URN SCR LID CUP LEK RST (MISCELLANEOUS) ×4 IMPLANT
CONN ST 1/4X3/8  BEN (MISCELLANEOUS) ×6
CONN ST 1/4X3/8 BEN (MISCELLANEOUS) IMPLANT
CONN Y 3/8X3/8X3/8  BEN (MISCELLANEOUS)
CONN Y 3/8X3/8X3/8 BEN (MISCELLANEOUS) IMPLANT
CONT SPEC 4OZ STRL OR WHT (MISCELLANEOUS) ×6
COVER BACK TABLE 60X90IN (DRAPES) ×3 IMPLANT
COVER SURGICAL LIGHT HANDLE (MISCELLANEOUS) ×3 IMPLANT
DERMABOND ADVANCED (GAUZE/BANDAGES/DRESSINGS) ×1
DERMABOND ADVANCED .7 DNX12 (GAUZE/BANDAGES/DRESSINGS) IMPLANT
DISSECTOR BLUNT TIP ENDO 5MM (MISCELLANEOUS) ×1 IMPLANT
DRAIN CHANNEL 28F RND 3/8 FF (WOUND CARE) ×2 IMPLANT
DRAPE LAPAROSCOPIC ABDOMINAL (DRAPES) ×3 IMPLANT
DRAPE WARM FLUID 44X44 (DRAPES) ×3 IMPLANT
ELECT BLADE 4.0 EZ CLEAN MEGAD (MISCELLANEOUS) ×3
ELECT REM PT RETURN 9FT ADLT (ELECTROSURGICAL) ×3
ELECTRODE BLDE 4.0 EZ CLN MEGD (MISCELLANEOUS) ×2 IMPLANT
ELECTRODE REM PT RTRN 9FT ADLT (ELECTROSURGICAL) ×2 IMPLANT
FILTER SMOKE EVAC ULPA (FILTER) ×3 IMPLANT
FORCEPS BIOP RJ4 1.8 (CUTTING FORCEPS) IMPLANT
GAUZE SPONGE 4X4 12PLY STRL (GAUZE/BANDAGES/DRESSINGS) ×3 IMPLANT
GAUZE SPONGE 4X4 12PLY STRL LF (GAUZE/BANDAGES/DRESSINGS) ×1 IMPLANT
GLOVE BIO SURGEON STRL SZ 6.5 (GLOVE) ×6 IMPLANT
GLOVE ECLIPSE 7.0 STRL STRAW (GLOVE) ×1 IMPLANT
GOWN STRL REUS W/ TWL LRG LVL3 (GOWN DISPOSABLE) ×6 IMPLANT
GOWN STRL REUS W/TWL LRG LVL3 (GOWN DISPOSABLE) ×15
KIT BASIN OR (CUSTOM PROCEDURE TRAY) ×3 IMPLANT
KIT CLEAN ENDO COMPLIANCE (KITS) ×3 IMPLANT
KIT SUCTION CATH 14FR (SUCTIONS) ×3 IMPLANT
KIT TURNOVER KIT B (KITS) ×3 IMPLANT
MARKER SKIN DUAL TIP RULER LAB (MISCELLANEOUS) IMPLANT
NS IRRIG 1000ML POUR BTL (IV SOLUTION) ×12 IMPLANT
OIL SILICONE PENTAX (PARTS (SERVICE/REPAIRS)) ×3 IMPLANT
PACK CHEST (CUSTOM PROCEDURE TRAY) ×3 IMPLANT
PAD ARMBOARD 7.5X6 YLW CONV (MISCELLANEOUS) ×6 IMPLANT
PASSER SUT SWANSON 36MM LOOP (INSTRUMENTS) IMPLANT
PENCIL SMOKE EVACUATOR (MISCELLANEOUS) ×3 IMPLANT
SCISSORS LAP 5X35 DISP (ENDOMECHANICALS) IMPLANT
SEALANT PROGEL (MISCELLANEOUS) IMPLANT
SEALANT SURG COSEAL 4ML (VASCULAR PRODUCTS) IMPLANT
SEALANT SURG COSEAL 8ML (VASCULAR PRODUCTS) IMPLANT
SET TUBE SMOKE EVAC HIGH FLOW (TUBING) ×1 IMPLANT
SLEEVE SUCTION 125 (MISCELLANEOUS) ×3 IMPLANT
SOL ANTI FOG 6CC (MISCELLANEOUS) ×2 IMPLANT
SOLUTION ANTI FOG 6CC (MISCELLANEOUS) ×1
SUT PROLENE 3 0 SH DA (SUTURE) IMPLANT
SUT PROLENE 4 0 RB 1 (SUTURE)
SUT PROLENE 4-0 RB1 .5 CRCL 36 (SUTURE) IMPLANT
SUT SILK  1 MH (SUTURE) ×18
SUT SILK 1 MH (SUTURE) ×8 IMPLANT
SUT SILK 1 TIES 10X30 (SUTURE) IMPLANT
SUT SILK 2 0SH CR/8 30 (SUTURE) IMPLANT
SUT SILK 3 0SH CR/8 30 (SUTURE) IMPLANT
SUT VIC AB 1 CTX 18 (SUTURE) IMPLANT
SUT VIC AB 1 CTX 36 (SUTURE) ×3
SUT VIC AB 1 CTX36XBRD ANBCTR (SUTURE) IMPLANT
SUT VIC AB 2-0 CTX 36 (SUTURE) ×2 IMPLANT
SUT VIC AB 3-0 X1 27 (SUTURE) ×1 IMPLANT
SUT VICRYL 0 UR6 27IN ABS (SUTURE) IMPLANT
SUT VICRYL 2 TP 1 (SUTURE) IMPLANT
SWAB COLLECTION DEVICE MRSA (MISCELLANEOUS) IMPLANT
SWAB CULTURE ESWAB REG 1ML (MISCELLANEOUS) IMPLANT
SYR 20ML ECCENTRIC (SYRINGE) ×4 IMPLANT
SYR 20ML LL LF (SYRINGE) ×1 IMPLANT
SYSTEM SAHARA CHEST DRAIN ATS (WOUND CARE) ×3 IMPLANT
TAPE CLOTH 4X10 WHT NS (GAUZE/BANDAGES/DRESSINGS) ×3 IMPLANT
TAPE CLOTH SURG 4X10 WHT LF (GAUZE/BANDAGES/DRESSINGS) ×1 IMPLANT
TAPE UMBILICAL COTTON 1/8X30 (MISCELLANEOUS) ×2 IMPLANT
TIP APPLICATOR SPRAY EXTEND 16 (VASCULAR PRODUCTS) IMPLANT
TOWEL GREEN STERILE (TOWEL DISPOSABLE) ×3 IMPLANT
TOWEL GREEN STERILE FF (TOWEL DISPOSABLE) ×3 IMPLANT
TRAP FLUID SMOKE EVACUATOR (MISCELLANEOUS) ×3 IMPLANT
TRAP SPECIMEN MUCOUS 40CC (MISCELLANEOUS) ×5 IMPLANT
TRAY FOLEY SLVR 16FR LF STAT (SET/KITS/TRAYS/PACK) ×3 IMPLANT
TROCAR XCEL 12X100 BLDLESS (ENDOMECHANICALS) ×3 IMPLANT
TUBE CONNECTING 20X1/4 (TUBING) ×3 IMPLANT
TUNNELER SHEATH ON-Q 11GX8 DSP (PAIN MANAGEMENT) IMPLANT
VALVE BIOPSY  SINGLE USE (MISCELLANEOUS) ×3
VALVE BIOPSY SINGLE USE (MISCELLANEOUS) ×2 IMPLANT
VALVE SUCTION BRONCHIO DISP (MISCELLANEOUS) ×3 IMPLANT
WATER STERILE IRR 1000ML POUR (IV SOLUTION) ×6 IMPLANT

## 2019-04-02 NOTE — Anesthesia Procedure Notes (Addendum)
Procedure Name: Intubation Date/Time: 04/02/2019 12:25 PM Performed by: Bryson Corona, CRNA Pre-anesthesia Checklist: Patient identified, Emergency Drugs available, Suction available and Patient being monitored Patient Re-evaluated:Patient Re-evaluated prior to induction Oxygen Delivery Method: Circle System Utilized Preoxygenation: Pre-oxygenation with 100% oxygen Induction Type: IV induction Ventilation: Oral airway inserted - appropriate to patient size Laryngoscope Size: Mac and 3 Grade View: Grade I Tube type: Oral Endobronchial tube: Left, Double lumen EBT, EBT position confirmed by fiberoptic bronchoscope and EBT position confirmed by auscultation and 35 Fr Number of attempts: 1 Airway Equipment and Method: Stylet and Oral airway Placement Confirmation: ETT inserted through vocal cords under direct vision,  positive ETCO2 and breath sounds checked- equal and bilateral Secured at: 28 cm Tube secured with: Tape Dental Injury: Teeth and Oropharynx as per pre-operative assessment  Comments: 8.5 ETT placed for Bronch. Grade 1 view with MAC 3. Pt extubated and reintubated with Size 35 DLT.

## 2019-04-02 NOTE — Progress Notes (Signed)
PT Cancellation Note  Patient Details Name: Brooke Ellis MRN: [id number] DOB: [birth year]   Cancelled Treatment:    Reason Eval/Treat Not Completed: Patient at procedure or test/unavailable.  Pt appears to be in the OR.  PT to check back tomorrow.  Thanks,  Verdene Lennert, PT, DPT  Acute Rehabilitation [phone] pager #[phone] office       Wells Guiles B Jaris Kohles 04/02/2019, 11:17 AM

## 2019-04-02 NOTE — Brief Op Note (Signed)
      [address]       [place] [address]             [phone]       04/02/2019  2:38 PM  PATIENT:  Brooke Ellis  69 y.o. female  PRE-OPERATIVE DIAGNOSIS:  LEFT EMPYEMA  POST-OPERATIVE DIAGNOSIS:  Left Empyema  PROCEDURE:  Procedure(s):  VIDEO BRONCHOSCOPY (N/A)  VIDEO ASSISTED THORACOSCOPY  -Drainage of Empyema -Decortication  SURGEON:  Surgeon(s) and Role:    * [name], MD - Primary  PHYSICIAN ASSISTANT: Ellwood Handler PA-C  ANESTHESIA:   general  EBL: 100   BLOOD ADMINISTERED:none  DRAINS: 28 Blake Drain x 2   LOCAL MEDICATIONS USED:  NONE  SPECIMEN:  Source of Specimen:  Pleural Fluid, Pleural Peel  DISPOSITION OF SPECIMEN:  Microbiology, Pathology  COUNTS:  YES   DICTATION: .Dragon Dictation  PLAN OF CARE: Admit to inpatient   PATIENT DISPOSITION:  PACU - hemodynamically stable.   Delay start of Pharmacological VTE agent (>24hrs) due to surgical blood loss or risk of bleeding: no

## 2019-04-02 NOTE — Transfer of Care (Signed)
Immediate Anesthesia Transfer of Care Note  Patient: Brooke Ellis  Procedure(s) Performed: VIDEO BRONCHOSCOPY (N/A Chest) VIDEO ASSISTED THORACOSCOPY (VATS)/DECORTICATION, Drainage of Emypema. (Left Chest)  Patient Location: PACU  Anesthesia Type:General  Level of Consciousness: drowsy and patient cooperative  Airway & Oxygen Therapy: Patient Spontanous Breathing and Patient connected to face mask oxygen  Post-op Assessment: Report given to RN and Post -op Vital signs reviewed and stable  Post vital signs: Reviewed and stable  Last Vitals:  Vitals Value Taken Time  BP 120/72 04/02/19 1453  Temp    Pulse 93 04/02/19 1459  Resp 20 04/02/19 1459  SpO2 97 % 04/02/19 1459  Vitals shown include unvalidated device data.  Last Pain:  Vitals:   04/02/19 0900  TempSrc:   PainSc: 0-No pain      Patients Stated Pain Goal: 2 (75/91/63 8466)  Complications: No apparent anesthesia complications

## 2019-04-02 NOTE — Progress Notes (Signed)
PROGRESS NOTE    Brooke Ellis  QJF:[id number] DOB: July 04, 1950 DOA: 03/28/2019 PCP: Derinda Late, MD      Brief Narrative:  Brooke Ellis is a 69 y.o. F with pre-diabetes, reflux and IDA who recently underwent EGD for surveillance Barrett's esophagus mid-Feb, and subsequently developed cough, pleuritic pain and systemic symptoms.    CT in the ER showed partially loculated left pleural effusion, lingular consolidation, without suspected focus of air/necrosis.       Assessment & Plan:  Empyema S/p drainage of empyema and decortication on 3/15 by Dr. Servando Snare Patient was admitted and had a left-sided chest tube.  Evidently there has been minimal improvement in her effusion so CT surgery was consulted yesterday and recommended VATS.  Underwent uncomplicated VATS today. -Consult CT surgery, appreciate cares -Continue vancomycin -Narrow Zosyn to unasyn   Pre-diabetes Glucoses to this point have been well controlled. -Check HgbA1c -Resume Metformin at discharge  Mood disorder -Continue Wellbutrin, Prozac  Barrett's esophagus and hiatal hernia -Continue pantoprazole  Chronic iron deficiency anemia In the 9-10 range -Transfusion threshold 7 g/dL  Hypokalemia Resolved   Hyperlipidemia -Zetia and Crestor have been held, drug holiday unlikely to affect outcome        Disposition: The patient was admitted with empyema.  She is now undergone VATS and decortication.   I will discharge when she is advance her diet, drains are out, pain is controlled, and a rehabilitation plan is in place.        MDM: The below labs and imaging reports were reviewed and summarized above.  Medication management as above.     DVT prophylaxis: Lovenox Code Status: Full code Family Communication: Brooke Ellis at the bedside    Consultants:   Interventional radiology  CT surgery  Procedures:   3/10 CT angiogram chest  3/11 CT-guided chest tube  placement  Antimicrobials:   Azithromycin 3/10-3/14  Vancomycin and Zosyn 3/10>>  Culture data:   3/10 blood culture x2 --no growth  3/11 body fluid culture --no growth          Subjective: Patient is in a lot of pain postoperatively.  She is got no focal weakness, numbness, speech deficits, confusion.  No fever.  Objective: Vitals:   04/02/19 1535 04/02/19 1607 04/02/19 1633 04/02/19 1656  BP: 128/68 123/65    Pulse: 92  95   Resp: _0 Temp: 98.1 F (36.7 C) 98.1 F (36.7 C)    TempSrc:  Oral    SpO2: 91% 91%  92%  Weight:      Height:        Intake/Output Summary (Last 24 hours) at 04/02/2019 1709 Last data filed at 04/02/2019 1600 Gross per 24 hour  Intake 1431.7 ml  Output 630 ml  Net 801.7 ml   Filed Weights   03/28/19 1155 03/28/19 1702 04/01/19 2009  Weight: 66 kg 63.5 kg 68.4 kg    Examination: General appearance: Early adult female, alert and in severe distress from pain.   HEENT: Anicteric, conjunctiva pink, lids and lashes normal. No nasal deformity, discharge, epistaxis.  Lips dry.   Skin: Warm and dry.  No jaundice.  No suspicious rashes or lesions. Cardiac: Tachycardic, regular, nl S1-S2, no murmurs appreciated.  Capillary refill is brisk  JVP not visible.  No LE edema.  Radial pulses 2+ and symmetric. Respiratory: Tachypneic, chest tubes in place, lung sounds normal in anterior fields, no rales or wheezes appreciated.   Abdomen: Abdomen soft.  No TTP or guarding.  No ascites, distension, hepatosplenomegaly.   MSK: No deformities or effusions. Neuro: Awake and alert.  EOMI, moves all extremities. Speech fluent.    Psych: Sensorium intact and responding to questions, attention minutes by pain.  Affect blunted.  Judgment and insight appear normal.    Data Reviewed: I have personally reviewed following labs and imaging studies:  CBC: Recent Labs  Lab 03/28/19 1206 03/28/19 1206 03/29/19 0437 03/30/19 1147 03/31/19 0546  04/01/19 0521 04/02/19 0306  WBC 14.6*   < > 22.5* 17.5* 16.2* 12.4* 11.8*  NEUTROABS 12.1*  --   --   --   --   --   --   HGB 11.7*   < > 10.7* 10.7* 10.6* 10.1* 9.8*  HCT 37.0   < > 34.0* 33.8* 34.4* 32.1* 31.0*  MCV 84.5   < > 84.6 84.7 85.1 83.8 83.8  PLT 594*   < > 609* 575* 595* 583* 810*   < > = values in this interval not displayed.   Basic Metabolic Panel: Recent Labs  Lab 03/28/19 1206 03/28/19 2012 03/29/19 0437 03/29/19 0437 03/30/19 0447 03/30/19 1147 03/31/19 0546 04/01/19 0521 04/02/19 0306  NA 134*  --  134*  --   --  134* 137 141 140  K 2.8*   < > 3.3*  --   --  2.8* 3.6 3.5 3.8  CL 88*  --  95*  --   --  95* 99 102 104  CO2 30  --  30  --   --  _0 GLUCOSE 127*  --  125*  --   --  100* 86 122* 98  BUN 8  --  6*  --   --  9 7* 6* <5*  CREATININE 0.90  --  0.73   < > 0.77 0.64 0.59 0.47 0.72  CALCIUM 8.5*  --  8.1*  --   --  8.1* 8.6* 8.4* 8.3*  MG 1.2*  --  2.2  --   --  2.0 1.9  --   --   PHOS  --   --   --   --   --   --  2.6  --   --    < > = values in this interval not displayed.   GFR: Estimated Creatinine Clearance: 58.1 mL/min (by C-G formula based on SCr of 0.72 mg/dL). Liver Function Tests: Recent Labs  Lab 03/28/19 1206 03/29/19 0437 03/31/19 0546 04/02/19 0306  AST 36 27 129* 82*  ALT 30 24 64* 62*  ALKPHOS 166* 139* 137* 146*  BILITOT 0.9 0.6 0.9 0.4  PROT 6.6 6.3* 6.0* 5.7*  ALBUMIN 2.6* 2.3* 2.1* 1.7*   No results for input(s): LIPASE, AMYLASE in the last 168 hours. No results for input(s): AMMONIA in the last 168 hours. Coagulation Profile: Recent Labs  Lab 03/29/19 1029 04/02/19 0306  INR 1.3* 1.2   Cardiac Enzymes: No results for input(s): CKTOTAL, CKMB, CKMBINDEX, TROPONINI in the last 168 hours. BNP (last 3 results) No results for input(s): PROBNP in the last 8760 hours. HbA1C: No results for input(s): HGBA1C in the last 72 hours. CBG: No results for input(s): GLUCAP in the last 168 hours. Lipid  Profile: No results for input(s): CHOL, HDL, LDLCALC, TRIG, CHOLHDL, LDLDIRECT in the last 72 hours. Thyroid Function Tests: No results for input(s): TSH, T4TOTAL, FREET4, T3FREE, THYROIDAB in the last 72 hours. Anemia Panel: No results for input(s): VITAMINB12, FOLATE, FERRITIN, TIBC, IRON, RETICCTPCT in  the last 72 hours. Urine analysis:    Component Value Date/Time   COLORURINE YELLOW 04/02/2019 0700   APPEARANCEUR CLEAR 04/02/2019 0700   LABSPEC 1.019 04/02/2019 0700   PHURINE 6.0 04/02/2019 0700   GLUCOSEU NEGATIVE 04/02/2019 0700   HGBUR NEGATIVE 04/02/2019 0700   BILIRUBINUR NEGATIVE 04/02/2019 0700   KETONESUR 5 (A) 04/02/2019 0700   PROTEINUR 30 (A) 04/02/2019 0700   NITRITE NEGATIVE 04/02/2019 0700   LEUKOCYTESUR TRACE (A) 04/02/2019 0700   Sepsis Labs: _0 (procalcitonin:4,lacticacidven:4)  ) Recent Results (from the past 240 hour(s))  Blood Culture (routine x 2)     Status: None   Collection Time: 03/28/19 12:06 PM   Specimen: BLOOD  Result Value Ref Range Status   Specimen Description BLOOD RIGHT ANTECUBITAL  Final   Special Requests   Final    BOTTLES DRAWN AEROBIC AND ANAEROBIC Blood Culture adequate volume Performed at [facility], [address] [place], [place] [address]    Culture NO GROWTH 5 DAYS  Final   Report Status 04/02/2019 FINAL  Final  Blood Culture (routine x 2)     Status: None   Collection Time: 03/28/19 12:06 PM   Specimen: BLOOD  Result Value Ref Range Status   Specimen Description BLOOD LEFT ANTECUBITAL  Final   Special Requests   Final    BOTTLES DRAWN AEROBIC AND ANAEROBIC Blood Culture adequate volume Performed at [facility], [address] [place], [place] [address]    Culture NO GROWTH 5 DAYS  Final   Report Status 04/02/2019 FINAL  Final  SARS Coronavirus 2 Ag (30 min TAT) - Nasal Swab (BD Veritor Kit)     Status: None   Collection Time: 03/28/19 12:06 PM   Specimen: Nasal Swab (BD Veritor Kit)   Result Value Ref Range Status   SARS Coronavirus 2 Ag NEGATIVE NEGATIVE Final    Comment: (NOTE) SARS-CoV-2 antigen NOT DETECTED.  Negative results are presumptive.  Negative results do not preclude SARS-CoV-2 infection and should not be used as the sole basis for treatment or other patient management decisions, including infection  control decisions, particularly in the presence of clinical signs and  symptoms consistent with COVID-19, or in those who have been in contact with the virus.  Negative results must be combined with clinical observations, patient history, and epidemiological information. The expected result is Negative. Fact Sheet for Patients: PodPark.tn Fact Sheet for Healthcare Providers: GiftContent.is This test is not yet approved or cleared by the [place] FDA and  has been authorized for detection and/or diagnosis of SARS-CoV-2 by FDA under an Emergency Use Authorization (EUA).  This EUA will remain in effect (meaning this test can be used) for the duration of  the COVID-19 de claration under Section 564(b)(1) of the Act, 21 U.S.C. section 360bbb-3(b)(1), unless the authorization is terminated or revoked sooner. Performed at [facility], [address] [place], [place] [address]   Respiratory Panel by RT PCR (Flu A&B, Covid) - Nasopharyngeal Swab     Status: None   Collection Time: 03/28/19  1:17 PM   Specimen: Nasopharyngeal Swab  Result Value Ref Range Status   SARS Coronavirus 2 by RT PCR NEGATIVE NEGATIVE Final    Comment: (NOTE) SARS-CoV-2 target nucleic acids are NOT DETECTED. The SARS-CoV-2 RNA is generally detectable in upper respiratoy specimens during the acute phase of infection. The lowest concentration of SARS-CoV-2 viral copies this assay can detect is 131 copies/mL. A negative  result does not preclude SARS-Cov-2 infection and should not be used as the sole basis for  treatment or other patient management decisions. A negative result may occur with  improper specimen collection/handling, submission of specimen other than nasopharyngeal swab, presence of viral mutation(s) within the areas targeted by this assay, and inadequate number of viral copies (<131 copies/mL). A negative result must be combined with clinical observations, patient history, and epidemiological information. The expected result is Negative. Fact Sheet for Patients:  PinkCheek.be Fact Sheet for Healthcare Providers:  GravelBags.it This test is not yet ap proved or cleared by the [place] FDA and  has been authorized for detection and/or diagnosis of SARS-CoV-2 by FDA under an Emergency Use Authorization (EUA). This EUA will remain  in effect (meaning this test can be used) for the duration of the COVID-19 declaration under Section 564(b)(1) of the Act, 21 U.S.C. section 360bbb-3(b)(1), unless the authorization is terminated or revoked sooner.    Influenza A by PCR NEGATIVE NEGATIVE Final   Influenza B by PCR NEGATIVE NEGATIVE Final    Comment: (NOTE) The Xpert Xpress SARS-CoV-2/FLU/RSV assay is intended as an aid in  the diagnosis of influenza from Nasopharyngeal swab specimens and  should not be used as a sole basis for treatment. Nasal washings and  aspirates are unacceptable for Xpert Xpress SARS-CoV-2/FLU/RSV  testing. Fact Sheet for Patients: PinkCheek.be Fact Sheet for Healthcare Providers: GravelBags.it This test is not yet approved or cleared by the [place] FDA and  has been authorized for detection and/or diagnosis of SARS-CoV-2 by  FDA under an Emergency Use Authorization (EUA). This EUA will remain  in effect (meaning this test can be used) for the duration of the  Covid-19 declaration under Section 564(b)(1) of the Act, 21  U.S.C. section  360bbb-3(b)(1), unless the authorization is  terminated or revoked. Performed at [facility], [address] [place], [place] [address]   Fungus Culture With Stain     Status: None (Preliminary result)   Collection Time: 03/29/19  3:40 PM   Specimen: PATH Cytology Pleural fluid  Result Value Ref Range Status   Fungus Stain Final report  Final    Comment: (NOTE) Performed At: [facility] [place], [place] [id number] Rush Farmer MD GQ:[id number]    Fungus (Mycology) Culture PENDING  Incomplete   Fungal Source PLEURAL  Final    Comment: Performed at [facility], [place] [address], [place], [place] [address]  Culture, body fluid-bottle     Status: None (Preliminary result)   Collection Time: 03/29/19  3:40 PM   Specimen: Pleura  Result Value Ref Range Status   Specimen Description PLEURAL  Final   Special Requests   Final    NONE Performed at [facility], [place] [address] [place], [place] [address]    Culture NO GROWTH 4 DAYS  Final   Report Status PENDING  Incomplete  Gram stain     Status: None   Collection Time: 03/29/19  3:40 PM   Specimen: Pleura  Result Value Ref Range Status   Specimen Description PLEURAL  Final   Special Requests NONE  Final   Gram Stain   Final    MODERATE WBC PRESENT, PREDOMINANTLY PMN NO ORGANISMS SEEN Performed at [facility], [place] [address], [place], [place] [address]    Report Status 03/29/2019 FINAL  Final  Fungus Culture Result     Status: None   Collection Time: 03/29/19  3:40 PM  Result Value Ref Range Status   Result 1 Comment  Final    Comment: (NOTE) KOH/Calcofluor preparation:  no fungus observed. Performed At: [facility] [place], [place] [id number] Rush Farmer MD JS:[id number]   Body fluid culture     Status: None (Preliminary result)   Collection Time: 04/02/19  1:02 PM   Specimen: Pleural, Left; Body Fluid  Result Value Ref Range  Status   Specimen Description PLEURAL LEFT  Final   Special Requests NONE  Final   Gram Stain   Final    FEW WBC PRESENT, PREDOMINANTLY PMN RARE GRAM POSITIVE COCCI Performed at [facility], [address], [place], [place] [address]    Culture PENDING  Incomplete   Report Status PENDING  Incomplete  Aerobic/Anaerobic Culture (surgical/deep wound)     Status: None (Preliminary result)   Collection Time: 04/02/19  1:38 PM   Specimen: Pleural, Left; Tissue  Result Value Ref Range Status   Specimen Description TISSUE PLEURAL LEFT  Final   Special Requests SPEC C  Final   Gram Stain   Final    RARE WBC PRESENT, PREDOMINANTLY PMN NO ORGANISMS SEEN Performed at [facility], [place] [address] [place], [place] [address]    Culture PENDING  Incomplete   Report Status PENDING  Incomplete         Radiology Studies: DG Chest Port 1 View  Result Date: 04/02/2019 CLINICAL DATA:  Left empyema. Status post drainage of empyema and decortication. EXAM: PORTABLE CHEST 1 VIEW COMPARISON:  Chest x-ray dated 04/01/2019 and chest CT dated 03/28/2019 FINDINGS: 2 left-sided chest tubes are now in place. No pneumothorax. Significant improvement in the aeration of the left lung after drainage of the empyema. There is residual atelectasis at the left lung base. Right lung is clear. Large hiatal hernia. Heart size and vascularity are normal. No acute bone abnormality. IMPRESSION: Significant improvement in the aeration of the left lung after drainage of the empyema. Electronically Signed   By: Lorriane Shire M.D.   On: 04/02/2019 15:35   DG CHEST PORT 1 VIEW  Result Date: 04/01/2019 CLINICAL DATA:  Pleural effusion EXAM: PORTABLE CHEST 1 VIEW COMPARISON:  03/31/2019 FINDINGS: Moderate to large left pleural effusion with apical capping. Associated pigtail drain. Overall, this is similar to the prior, although increased loculated fluid in the left upper lobe is possible. Associated left lower lobe  atelectasis. Right basilar atelectasis. No pneumothorax. The heart is normal in size. IMPRESSION: Moderate to large left pleural effusion with associated left chest drain. No pneumothorax. When compared to the prior, increased loculated fluid in the left upper lobe is possible. Electronically Signed   By: Julian Hy M.D.   On: 04/01/2019 07:05        Scheduled Meds: . acetaminophen  1,000 mg Oral Q6H   Or  . acetaminophen (TYLENOL) oral liquid 160 mg/5 mL  1,000 mg Oral Q6H  . bisacodyl  10 mg Oral Daily  . buPROPion  300 mg Oral Daily  . Chlorhexidine Gluconate Cloth  6 each Topical Daily  . fentaNYL   Intravenous Q4H  . FLUoxetine  10 mg Oral Daily  . influenza vaccine adjuvanted  0.5 mL Intramuscular Tomorrow-1000  . metoCLOPramide (REGLAN) injection  10 mg Intravenous Q6H  . pantoprazole  40 mg Oral BID  . senna-docusate  1 tablet Oral QHS   Continuous Infusions: . sodium chloride    . dextrose 5 % and 0.45%  NaCl 100 mL/hr at 04/02/19 1650     LOS: 5 days    Time spent: 25 minutes    Edwin Dada, MD [facility] 04/02/2019, 5:09 PM     Please page though [place] or Epic secure chat:  For [employer], Adult nurse

## 2019-04-02 NOTE — Anesthesia Procedure Notes (Signed)
Arterial Line Insertion Start/End3/15/2021 10:30 AM, 04/02/2019 10:40 AM Performed by: CRNA  Patient location: Pre-op. Preanesthetic checklist: patient identified, IV checked, site marked, risks and benefits discussed, surgical consent, monitors and equipment checked, pre-op evaluation, timeout performed and anesthesia consent Lidocaine 1% used for infiltration Right, radial was placed Catheter size: 20 Fr Hand hygiene performed  and maximum sterile barriers used   Attempts: 1 Procedure performed without using ultrasound guided technique. Following insertion, dressing applied and Biopatch. Post procedure assessment: normal and unchanged  Patient tolerated the procedure well with no immediate complications.

## 2019-04-02 NOTE — Anesthesia Postprocedure Evaluation (Signed)
Anesthesia Post Note  Patient: Brooke Ellis  Procedure(s) Performed: VIDEO BRONCHOSCOPY (N/A Chest) VIDEO ASSISTED THORACOSCOPY (VATS)/DECORTICATION, Drainage of Emypema. (Left Chest)     Patient location during evaluation: PACU Anesthesia Type: General Level of consciousness: awake and alert Pain management: pain level controlled Vital Signs Assessment: post-procedure vital signs reviewed and stable Respiratory status: spontaneous breathing, nonlabored ventilation, respiratory function stable and patient connected to nasal cannula oxygen Cardiovascular status: blood pressure returned to baseline and stable Postop Assessment: no apparent nausea or vomiting Anesthetic complications: no    Last Vitals:  Vitals:   04/02/19 1535 04/02/19 1607  BP: 128/68 123/65  Pulse: 92   Resp: 18 14  Temp: 36.7 C 36.7 C  SpO2: 91% 91%    Last Pain:  Vitals:   04/02/19 1607  TempSrc: Oral  PainSc: 10-Worst pain ever                 Eithel Ryall S

## 2019-04-02 NOTE — Progress Notes (Signed)
Patient arrived on stretcher via [place] from [facility] at approximately 1950. Patient alert and oriented X 4. Denies pain. On oxygen at 2 liters via [place], no distress noted. Skin intact and verified with other RN. IV line patent and without signs of infection or infiltration. Left chest tube in place, on 20 cm of suction. Patient independent at baseline. Oriented to room and call bell system, patient expresses understanding.Scheuled to be NPO after midnight, patient aware and refusing food at this time. Continue ongoing monitoring and plan of care.

## 2019-04-02 NOTE — Progress Notes (Signed)
Pharmacy Antibiotic Note  Brooke Ellis is a 69 y.o. female admitted on 2/45/8099 with complicated left lung PNA and empyema now s/p drainage and decortication by CVTS. Post-op the antibiotics are being narrowed to Vancomycin + Unasyn.   The patient had a Vancomycin peak drawn yesterday which was low at 19 mcg/ml. A Vancomycin trough was due this evening however it is noted that the Vancomcyin dose was given ~7 hours early pre-op at 1230 today. Will assume that the patient's dose is likely low given the low peak from yesterday.   Plan: - Adjust Vancomycin to 750 mg/12h - Will follow-up new levels at steady state if continues - Start Unasyn 3g IV every 6 hours - Will continue to follow renal function, culture results, LOT, and antibiotic de-escalation plans   Height: 5' (152.4 cm) Weight: 150 lb 12.7 oz (68.4 kg) IBW/kg (Calculated) : 45.5  Temp (24hrs), Avg:98.2 F (36.8 C), Min:97.9 F (36.6 C), Max:98.4 F (36.9 C)  Recent Labs  Lab 03/28/19 1206 03/28/19 1206 03/28/19 1402 03/29/19 0437 03/29/19 0437 03/30/19 0447 03/30/19 1147 03/31/19 0546 04/01/19 0521 04/01/19 2205 04/02/19 0306  WBC 14.6*   < >  --  22.5*  --   --  17.5* 16.2* 12.4*  --  11.8*  CREATININE 0.90   < >  --  0.73   < > 0.77 0.64 0.59 0.47  --  0.72  LATICACIDVEN 1.9  --  1.1  --   --   --   --   --   --   --   --   VANCOPEAK  --   --   --   --   --   --   --   --   --  19*  --    < > = values in this interval not displayed.    Estimated Creatinine Clearance: 58.1 mL/min (by C-G formula based on SCr of 0.72 mg/dL).    Allergies  Allergen Reactions  . Macrodantin     Chemical pneumonia   . Morphine And Related     hallucinations   . Sulfa Antibiotics Itching and Swelling    3/10 azith >> 3/15 3/10 CTX x 1 3/10 clinda x 1 3/10 Vanc >> 3/10 Zosyn>> 3/15 3/15 Unasyn >>  3/10 covid/flu neg 3/10 BCx2: ngtd 3/11 Pleural fluid: ngtd (nothing on Gm stain) - fungal Cx pending (KOH prep  neg) 3/15 lung tissue >>  Thank you for allowing pharmacy to be a part of this patient's care.  Alycia Rossetti, PharmD, BCPS Clinical Pharmacist Clinical phone for 04/02/2019: [phone] 04/02/2019 5:46 PM   **Pharmacist phone directory can now be found on [email or web address] ([email or web address]).  Listed under [place].

## 2019-04-02 NOTE — Anesthesia Preprocedure Evaluation (Addendum)
Anesthesia Evaluation  Patient identified by MRN, date of birth, ID band Patient awake    Reviewed: Allergy & Precautions, NPO status , Patient's Chart, lab work & pertinent test results  Airway Mallampati: II  TM Distance: >3 FB Neck ROM: Full    Dental no notable dental hx.    Pulmonary asthma , pneumonia, unresolved,    Pulmonary exam normal breath sounds clear to auscultation + decreased breath sounds      Cardiovascular negative cardio ROS Normal cardiovascular exam Rhythm:Regular Rate:Normal     Neuro/Psych negative neurological ROS  negative psych ROS   GI/Hepatic Neg liver ROS, hiatal hernia,   Endo/Other  negative endocrine ROS  Renal/GU negative Renal ROS  negative genitourinary   Musculoskeletal negative musculoskeletal ROS (+)   Abdominal   Peds negative pediatric ROS (+)  Hematology negative hematology ROS (+) anemia ,   Anesthesia Other Findings   Reproductive/Obstetrics negative OB ROS                             Anesthesia Physical Anesthesia Plan  ASA: III  Anesthesia Plan: General   Post-op Pain Management:    Induction: Intravenous and Rapid sequence  PONV Risk Score and Plan: 3 and Ondansetron, Dexamethasone and Treatment may vary due to age or medical condition  Airway Management Planned: Double Lumen EBT  Additional Equipment: Arterial line  Intra-op Plan:   Post-operative Plan: Possible Post-op intubation/ventilation  Informed Consent: I have reviewed the patients History and Physical, chart, labs and discussed the procedure including the risks, benefits and alternatives for the proposed anesthesia with the patient or authorized representative who has indicated his/her understanding and acceptance.     Dental advisory given  Plan Discussed with: CRNA and Surgeon  Anesthesia Plan Comments:        Anesthesia Quick Evaluation

## 2019-04-03 ENCOUNTER — Inpatient Hospital Stay (HOSPITAL_COMMUNITY): Payer: Medicare Other

## 2019-04-03 LAB — SURGICAL PATHOLOGY

## 2019-04-03 LAB — CBC
HCT: 32 % — ABNORMAL LOW (ref 36.0–46.0)
Hemoglobin: 10.1 g/dL — ABNORMAL LOW (ref 12.0–15.0)
MCH: 26.4 pg (ref 26.0–34.0)
MCHC: 31.6 g/dL (ref 30.0–36.0)
MCV: 83.6 fL (ref 80.0–100.0)
Platelets: 700 10*3/uL — ABNORMAL HIGH (ref 150–400)
RBC: 3.83 MIL/uL — ABNORMAL LOW (ref 3.87–5.11)
RDW: 14.2 % (ref 11.5–15.5)
WBC: 21.4 10*3/uL — ABNORMAL HIGH (ref 4.0–10.5)
nRBC: 0 % (ref 0.0–0.2)

## 2019-04-03 LAB — BLOOD GAS, ARTERIAL
Acid-Base Excess: 5.5 mmol/L — ABNORMAL HIGH (ref 0.0–2.0)
Bicarbonate: 29.8 mmol/L — ABNORMAL HIGH (ref 20.0–28.0)
FIO2: 40
O2 Saturation: 95.4 %
Patient temperature: 36.8
pCO2 arterial: 45.9 mmHg (ref 32.0–48.0)
pH, Arterial: 7.427 (ref 7.350–7.450)
pO2, Arterial: 77.2 mmHg — ABNORMAL LOW (ref 83.0–108.0)

## 2019-04-03 LAB — CULTURE, BODY FLUID W GRAM STAIN -BOTTLE: Culture: NO GROWTH

## 2019-04-03 LAB — GLUCOSE, CAPILLARY
Glucose-Capillary: 97 mg/dL (ref 70–99)
Glucose-Capillary: 89 mg/dL (ref 70–99)
Glucose-Capillary: 103 mg/dL — ABNORMAL HIGH (ref 70–99)

## 2019-04-03 LAB — BASIC METABOLIC PANEL
Anion gap: 10 (ref 5–15)
BUN: <5 mg/dL — ABNORMAL LOW (ref 8–23)
CO2: 29 mmol/L (ref 22–32)
Calcium: 8.4 mg/dL — ABNORMAL LOW (ref 8.9–10.3)
Chloride: 99 mmol/L (ref 98–111)
Creatinine, Ser: 0.63 mg/dL (ref 0.44–1.00)
GFR calc Af Amer: >60 mL/min (ref >60)
GFR calc non Af Amer: >60 mL/min (ref >60)
Glucose, Bld: 157 mg/dL — ABNORMAL HIGH (ref 70–99)
Potassium: 3.4 mmol/L — ABNORMAL LOW (ref 3.5–5.1)
Sodium: 138 mmol/L (ref 135–145)

## 2019-04-03 MED ORDER — POTASSIUM CHLORIDE CRYS ER 20 MEQ PO TBCR
40.0000 meq | EXTENDED_RELEASE_TABLET | Freq: Once | ORAL | Status: AC
  Administered 2019-04-03: 40 meq via ORAL
  Filled 2019-04-03: qty 2

## 2019-04-03 MED ORDER — EZETIMIBE 10 MG PO TABS
10.0000 mg | ORAL_TABLET | Freq: Every evening | ORAL | Status: DC
  Administered 2019-04-03 – 2019-04-06 (×4): 10 mg via ORAL
  Filled 2019-04-03 (×4): qty 1

## 2019-04-03 MED ORDER — GUAIFENESIN ER 600 MG PO TB12
600.0000 mg | ORAL_TABLET | Freq: Two times a day (BID) | ORAL | Status: DC
  Administered 2019-04-03 – 2019-04-07 (×9): 600 mg via ORAL
  Filled 2019-04-03 (×9): qty 1

## 2019-04-03 MED ORDER — INSULIN ASPART 100 UNIT/ML ~~LOC~~ SOLN: 0.0000 [IU] | Freq: Three times a day (TID) | SUBCUTANEOUS | Status: DC

## 2019-04-03 MED ORDER — ROSUVASTATIN CALCIUM 20 MG PO TABS
40.0000 mg | ORAL_TABLET | Freq: Every day | ORAL | Status: DC
  Administered 2019-04-03 – 2019-04-06 (×4): 40 mg via ORAL
  Filled 2019-04-03 (×4): qty 2

## 2019-04-03 MED ORDER — KETOROLAC TROMETHAMINE 30 MG/ML IJ SOLN
30.0000 mg | Freq: Four times a day (QID) | INTRAMUSCULAR | Status: AC
  Administered 2019-04-03 – 2019-04-04 (×4): 30 mg via INTRAVENOUS
  Filled 2019-04-03 (×4): qty 1

## 2019-04-03 MED ORDER — ENOXAPARIN SODIUM 30 MG/0.3ML ~~LOC~~ SOLN
30.0000 mg | SUBCUTANEOUS | Status: DC
  Administered 2019-04-03 – 2019-04-07 (×5): 30 mg via SUBCUTANEOUS
  Filled 2019-04-03 (×5): qty 0.3

## 2019-04-03 NOTE — Op Note (Signed)
Brooke Ellis, [facility] MEDICAL RECORD KZ:[id number] ACCOUNT [id number] DATE OF BIRTH:12-Sep-1950 FACILITY: [facility] LOCATION: [place], [place]  OPERATIVE REPORT  DATE OF PROCEDURE:  04/02/2019  PREOPERATIVE DIAGNOSIS:  Left lower lobe pneumonia with empyema.  POSTOPERATIVE DIAGNOSIS:  Left lower lobe pneumonia with empyema.  SURGICAL PROCEDURES:   1.  Video bronchoscopy. 2.  Left video-assisted thoracoscopy.  3.  Mini thoracotomy with evacuation of empyema and decortication.  SURGEON:  Lanelle Bal, MD  FIRST ASSISTANT:  Ellwood Handler, PA-C  BRIEF HISTORY:  The patient is a 69 year old female who had been in good health, working full time until the last month.  In mid-February, she had undergone upper GI endoscopy for her known Barrett esophagus and large hiatal hernia.  She noted several  days after this, she developed chest pain, cough and respiratory symptoms.  This slowly progressed intermittently, with antibiotics being given for over the month.  Finally, 4 days ago, she sought medical attention and was admitted through the emergency  room at [facility] with a complex left pleural effusion and evidence of left lower lobe pneumonia.  CT scan of the chest was done confirming this.  There was no leakage of oral contrast from the GI tract.  Initial attempt at placement of a CT-directed  chest tube removed approximately 400 mL of straw-colored fluid, but without much improvement on chest x-ray.  With this, it was decided to proceed with left video-assisted thoracoscopy and drainage of empyema.  Risks and options were discussed with the  patient, her husband and son.  The patient was transferred from [facility] to [facility]:  The patient underwent general endotracheal anesthesia, initially with a single-lumen endotracheal tube without difficulty.  Appropriate timeout was performed and we proceeded with video bronchoscopy to the  subsegmental level in  both the left and right tracheobronchial tree.  There were no endobronchial lesions appreciated and fairly minimal secretions.  Lavage of the left lower lobe was performed and specimen submitted for cultures.  The patient was then turned in the lateral  decubitus position with the left side up.  The left side had preoperatively been marked.  The left chest was prepped with Betadine, draped in the usual sterile manner.  Appropriate timeout was performed and we proceeded approximately at the 5th  intercostal space with a small incision and introduced a 5 mm port into the chest with mild insufflation.  Significant proteinaceous material and septation was noted.  We then opened this incision more and entered into the left chest.  A second port site  was placed at approximately the4th intercostal space slightly more anterior.  Through the small incision and with the assistance of the videoscope, we were able to break up the loculations and decorticate the lung drainage and drained approximately 500  mL of fluid.  Cultures of the proteinaceous debris and pathology were obtained.  Through these incisions, we were able to decorticate the lung.  With the left chest cleared as much as possible, two 28 Blake drains were left in 2 of the port sites and the  remaining small incision was closed with interrupted 0 Vicryl, running 2-0 Vicryl and subcuticular stitch with 3-0 Vicryl.  Dermabond was applied.  The patient was awakened in the operating room and extubated in stable condition, was transferred to the  recovery room.  Sponge and needle count was reported as correct.  The patient tolerated the procedure without obvious complication and was transferred to the recovery room  for postoperative care.  VN/NUANCE  D:04/03/2019 T:04/03/2019 JOB:[id number]

## 2019-04-03 NOTE — Progress Notes (Signed)
PROGRESS NOTE    Sharonna Ellis  IRC:[id number] DOB: 22-Jun-1950 DOA: 03/28/2019 PCP: Derinda Late, MD      Brief Narrative:  Brooke Ellis is a 69 y.o. F with pre-diabetes, reflux and IDA who recently underwent EGD for surveillance Barrett's esophagus mid-Feb, and subsequently developed cough, pleuritic pain and systemic symptoms.    CT in the ER showed partially loculated left pleural effusion, lingular consolidation, without suspected focus of air/necrosis.       Assessment & Plan:  Empyema S/p drainage of empyema and decortication on 3/15 by Dr. Servando Snare Patient was admitted and had a left-sided chest tube.  Evidently there was minimal improvement in her effusion with tube so CT surgery was consulted and recommended VATS.  Underwent uncomplicated VATS 5/10, pleural, BAL cultures obtained.  MRSA nares negative.  -Stop vanc -Continue Unasyn -Follow culture data  -Consult CT surgery, appreciate cares     Pre-diabetes Patient without history of diabetes, takes metformin for pre-diabetes -Check HgbA1c -CT surgery have started SS corrections -Resume Metformin at discharge  Mood disorder -Continue Wellbutrin, Prozac  Barrett's esophagus and hiatal hernia -Continue pantoprazole  Chronic iron deficiency anemia In the 9-10 range, stable post-op -Transfusion threshold 7 g/dL  Hypokalemia -Supp K   Hyperlipidemia -Continue Zetia and Crestor        Disposition: The patient was admitted with empyema.  She has now undergone VATS, and decortication.   She still has chest tube to suction, IV opiates via PCA pump, requires ongoing inpatient services.    CT surgery will plan to progress her diet, remove drains, and control pain over the next few days.  They will alert [place] 24 hours prior to discharge.  PT eval in place.         MDM: The below labs and imaging reports reviewed and summarized above.  Medication management as above.      DVT prophylaxis:  Lovenox Code Status: Full code Family Communication: Husband at the bedside    Consultants:   Interventional radiology  CT surgery  Procedures:   3/10 CT angiogram chest  3/11 CT-guided chest tube placement  3/15 VATS empyema drainage, BAL and decortication  Antimicrobials:   Azithromycin 3/10-3/14  Vancomycin and Zosyn 3/10>>3/15  Unasyn 3/15 >>  Culture data:   3/10 blood culture x2 --no growth  3/11 body fluid culture --no growth  3/15 pleural fluid culture --Gram stain rare gram-positive cocci, culture pending  3/15 AFB and fungal cultures pending  3/15 BAL --culture pending  3/15 pleural tissue culture --Gram stain negative, culture pending          Subjective: Pain is controlled with the PCA pump.  She feels "loopy".  No focal weakness, numbness, speech deficits, confusion, fever.  Drainage from her tubes is decreasing overnight.         Objective: Vitals:   04/03/19 0753 04/03/19 0800 04/03/19 1100 04/03/19 1217  BP:  115/62 101/63   Pulse:  96 86   Resp: _0 Temp:   98.1 F (36.7 C)   TempSrc:   Oral   SpO2: 95% 95% 98% 96%  Weight:      Height:        Intake/Output Summary (Last 24 hours) at 04/03/2019 1525 Last data filed at 04/03/2019 0900 Gross per 24 hour  Intake -  Output 1475 ml  Net -1475 ml   Filed Weights   03/28/19 1155 03/28/19 1702 04/01/19 2009  Weight: 66 kg 63.5 kg 68.4 kg  Examination: General appearance: Elderly adult female, alert and in mild distress from pain.   HEENT: Anicteric, conjunctiva pink, lids and lashes normal. No nasal deformity, discharge, epistaxis.  Lips moist, teeth normal. OP moist, no oral lesions.   Skin: Warm and dry.  No suspicious rashes or lesions. Cardiac: Tachycardic, regular, no murmurs appreciated.  No LE edema.    Respiratory: Respirations somewhat fast, shallow, no accessory muscle use or labored breathing.  Lung sounds diminished, no wheezes appreciated.  Abdomen: Abdomen soft.  No tenderness palpation or guarding. No ascites, distension, hepatosplenomegaly.   MSK: No deformities or effusions of the large joints of the upper or lower extremities bilaterally. Neuro: Awake and alert. Naming is grossly intact, and the patient's recall, recent and remote, as well as general fund of knowledge seem within normal limits.  Muscle tone normal, without fasciculations.  Moves all extremities equally and with normal coordination but severe generalized weakness.  Speech fluent.    Psych: Sensorium intact and responding to questions, attention blunted. Affect blunted.  Judgment and insight appear normal.      Data Reviewed: I have personally reviewed following labs and imaging studies:  CBC: Recent Labs  Lab 03/28/19 1206 03/29/19 0437 03/30/19 1147 03/31/19 0546 04/01/19 0521 04/02/19 0306 04/03/19 0245  WBC 14.6*   < > 17.5* 16.2* 12.4* 11.8* 21.4*  NEUTROABS 12.1*  --   --   --   --   --   --   HGB 11.7*   < > 10.7* 10.6* 10.1* 9.8* 10.1*  HCT 37.0   < > 33.8* 34.4* 32.1* 31.0* 32.0*  MCV 84.5   < > 84.7 85.1 83.8 83.8 83.6  PLT 594*   < > 575* 595* 583* 810* 700*   < > = values in this interval not displayed.   Basic Metabolic Panel: Recent Labs  Lab 03/28/19 1206 03/28/19 2012 03/29/19 0437 03/30/19 0447 03/30/19 1147 03/31/19 0546 04/01/19 0521 04/02/19 0306 04/03/19 0245  NA 134*  --  134*  --  134* 137 141 140 138  K 2.8*   < > 3.3*  --  2.8* 3.6 3.5 3.8 3.4*  CL 88*  --  95*  --  95* 99 102 104 99  CO2 30  --  30  --  _0 GLUCOSE 127*  --  125*  --  100* 86 122* 98 157*  BUN 8  --  6*  --  9 7* 6* <5* <5*  CREATININE 0.90  --  0.73   < > 0.64 0.59 0.47 0.72 0.63  CALCIUM 8.5*  --  8.1*  --  8.1* 8.6* 8.4* 8.3* 8.4*  MG 1.2*  --  2.2  --  2.0 1.9  --   --   --   PHOS  --   --   --   --   --  2.6  --   --   --    < > = values in this interval not displayed.   GFR: Estimated Creatinine Clearance: 58.1 mL/min  (by C-G formula based on SCr of 0.63 mg/dL). Liver Function Tests: Recent Labs  Lab 03/28/19 1206 03/29/19 0437 03/31/19 0546 04/02/19 0306  AST 36 27 129* 82*  ALT 30 24 64* 62*  ALKPHOS 166* 139* 137* 146*  BILITOT 0.9 0.6 0.9 0.4  PROT 6.6 6.3* 6.0* 5.7*  ALBUMIN 2.6* 2.3* 2.1* 1.7*   No results for input(s): LIPASE, AMYLASE in the last 168 hours.  No results for input(s): AMMONIA in the last 168 hours. Coagulation Profile: Recent Labs  Lab 03/29/19 1029 04/02/19 0306  INR 1.3* 1.2   Cardiac Enzymes: No results for input(s): CKTOTAL, CKMB, CKMBINDEX, TROPONINI in the last 168 hours. BNP (last 3 results) No results for input(s): PROBNP in the last 8760 hours. HbA1C: No results for input(s): HGBA1C in the last 72 hours. CBG: Recent Labs  Lab 04/03/19 1149  GLUCAP 97   Lipid Profile: No results for input(s): CHOL, HDL, LDLCALC, TRIG, CHOLHDL, LDLDIRECT in the last 72 hours. Thyroid Function Tests: No results for input(s): TSH, T4TOTAL, FREET4, T3FREE, THYROIDAB in the last 72 hours. Anemia Panel: No results for input(s): VITAMINB12, FOLATE, FERRITIN, TIBC, IRON, RETICCTPCT in the last 72 hours. Urine analysis:    Component Value Date/Time   COLORURINE YELLOW 04/02/2019 0700   APPEARANCEUR CLEAR 04/02/2019 0700   LABSPEC 1.019 04/02/2019 0700   PHURINE 6.0 04/02/2019 0700   GLUCOSEU NEGATIVE 04/02/2019 0700   HGBUR NEGATIVE 04/02/2019 0700   BILIRUBINUR NEGATIVE 04/02/2019 0700   KETONESUR 5 (A) 04/02/2019 0700   PROTEINUR 30 (A) 04/02/2019 0700   NITRITE NEGATIVE 04/02/2019 0700   LEUKOCYTESUR TRACE (A) 04/02/2019 0700   Sepsis Labs: _0 (procalcitonin:4,lacticacidven:4)  ) Recent Results (from the past 240 hour(s))  Blood Culture (routine x 2)     Status: None   Collection Time: 03/28/19 12:06 PM   Specimen: BLOOD  Result Value Ref Range Status   Specimen Description BLOOD RIGHT ANTECUBITAL  Final   Special Requests   Final    BOTTLES DRAWN  AEROBIC AND ANAEROBIC Blood Culture adequate volume Performed at [facility], [address] [place], [place] [address]    Culture NO GROWTH 5 DAYS  Final   Report Status 04/02/2019 FINAL  Final  Blood Culture (routine x 2)     Status: None   Collection Time: 03/28/19 12:06 PM   Specimen: BLOOD  Result Value Ref Range Status   Specimen Description BLOOD LEFT ANTECUBITAL  Final   Special Requests   Final    BOTTLES DRAWN AEROBIC AND ANAEROBIC Blood Culture adequate volume Performed at [facility], [address] [place], [place] [address]    Culture NO GROWTH 5 DAYS  Final   Report Status 04/02/2019 FINAL  Final  SARS Coronavirus 2 Ag (30 min TAT) - Nasal Swab (BD Veritor Kit)     Status: None   Collection Time: 03/28/19 12:06 PM   Specimen: Nasal Swab (BD Veritor Kit)  Result Value Ref Range Status   SARS Coronavirus 2 Ag NEGATIVE NEGATIVE Final    Comment: (NOTE) SARS-CoV-2 antigen NOT DETECTED.  Negative results are presumptive.  Negative results do not preclude SARS-CoV-2 infection and should not be used as the sole basis for treatment or other patient management decisions, including infection  control decisions, particularly in the presence of clinical signs and  symptoms consistent with COVID-19, or in those who have been in contact with the virus.  Negative results must be combined with clinical observations, patient history, and epidemiological information. The expected result is Negative. Fact Sheet for Patients: PodPark.tn Fact Sheet for Healthcare Providers: GiftContent.is This test is not yet approved or cleared by the [place] FDA and  has been authorized for detection and/or diagnosis of SARS-CoV-2 by FDA under an Emergency Use Authorization (EUA).  This EUA will remain in effect (meaning this test can be used) for the duration of  the COVID-19 de claration  under Section  564(b)(1) of the Act, 21 U.S.C. section 360bbb-3(b)(1), unless the authorization is terminated or revoked sooner. Performed at [facility], [address] [place], [place] [address]   Respiratory Panel by RT PCR (Flu A&B, Covid) - Nasopharyngeal Swab     Status: None   Collection Time: 03/28/19  1:17 PM   Specimen: Nasopharyngeal Swab  Result Value Ref Range Status   SARS Coronavirus 2 by RT PCR NEGATIVE NEGATIVE Final    Comment: (NOTE) SARS-CoV-2 target nucleic acids are NOT DETECTED. The SARS-CoV-2 RNA is generally detectable in upper respiratoy specimens during the acute phase of infection. The lowest concentration of SARS-CoV-2 viral copies this assay can detect is 131 copies/mL. A negative result does not preclude SARS-Cov-2 infection and should not be used as the sole basis for treatment or other patient management decisions. A negative result may occur with  improper specimen collection/handling, submission of specimen other than nasopharyngeal swab, presence of viral mutation(s) within the areas targeted by this assay, and inadequate number of viral copies (<131 copies/mL). A negative result must be combined with clinical observations, patient history, and epidemiological information. The expected result is Negative. Fact Sheet for Patients:  PinkCheek.be Fact Sheet for Healthcare Providers:  GravelBags.it This test is not yet ap proved or cleared by the [place] FDA and  has been authorized for detection and/or diagnosis of SARS-CoV-2 by FDA under an Emergency Use Authorization (EUA). This EUA will remain  in effect (meaning this test can be used) for the duration of the COVID-19 declaration under Section 564(b)(1) of the Act, 21 U.S.C. section 360bbb-3(b)(1), unless the authorization is terminated or revoked sooner.    Influenza A by PCR NEGATIVE NEGATIVE Final   Influenza B by PCR NEGATIVE  NEGATIVE Final    Comment: (NOTE) The Xpert Xpress SARS-CoV-2/FLU/RSV assay is intended as an aid in  the diagnosis of influenza from Nasopharyngeal swab specimens and  should not be used as a sole basis for treatment. Nasal washings and  aspirates are unacceptable for Xpert Xpress SARS-CoV-2/FLU/RSV  testing. Fact Sheet for Patients: PinkCheek.be Fact Sheet for Healthcare Providers: GravelBags.it This test is not yet approved or cleared by the [place] FDA and  has been authorized for detection and/or diagnosis of SARS-CoV-2 by  FDA under an Emergency Use Authorization (EUA). This EUA will remain  in effect (meaning this test can be used) for the duration of the  Covid-19 declaration under Section 564(b)(1) of the Act, 21  U.S.C. section 360bbb-3(b)(1), unless the authorization is  terminated or revoked. Performed at [facility], [address] [place], [place] [address]   Fungus Culture With Stain     Status: None (Preliminary result)   Collection Time: 03/29/19  3:40 PM   Specimen: PATH Cytology Pleural fluid  Result Value Ref Range Status   Fungus Stain Final report  Final    Comment: (NOTE) Performed At: [facility] [place], [place] [id number] Rush Farmer MD [id number]    Fungus (Mycology) Culture PENDING  Incomplete   Fungal Source PLEURAL  Final    Comment: Performed at [facility], [place] [address] [place], [place] [address]  Culture, body fluid-bottle     Status: None   Collection Time: 03/29/19  3:40 PM   Specimen: Pleura  Result Value Ref Range Status   Specimen Description PLEURAL  Final   Special Requests NONE  Final   Culture   Final  NO GROWTH 5 DAYS Performed at [facility], [place] [address], [place], [place] [address]    Report Status 04/03/2019 FINAL  Final  Gram stain     Status: None   Collection Time: 03/29/19  3:40 PM    Specimen: Pleura  Result Value Ref Range Status   Specimen Description PLEURAL  Final   Special Requests NONE  Final   Gram Stain   Final    MODERATE WBC PRESENT, PREDOMINANTLY PMN NO ORGANISMS SEEN Performed at [facility], [place] [address] [place], [place] [address]    Report Status 03/29/2019 FINAL  Final  Fungus Culture Result     Status: None   Collection Time: 03/29/19  3:40 PM  Result Value Ref Range Status   Result 1 Comment  Final    Comment: (NOTE) KOH/Calcofluor preparation:  no fungus observed. Performed At: [facility] [place], [place] [id number] Rush Farmer MD VO:[id number]   Body fluid culture     Status: None (Preliminary result)   Collection Time: 04/02/19  1:02 PM   Specimen: Pleural, Left; Body Fluid  Result Value Ref Range Status   Specimen Description PLEURAL LEFT  Final   Special Requests NONE  Final   Gram Stain   Final    FEW WBC PRESENT, PREDOMINANTLY PMN RARE GRAM POSITIVE COCCI    Culture   Final    NO GROWTH < 24 HOURS Performed at [facility], [address], [place], [place] [address]    Report Status PENDING  Incomplete  Culture, respiratory     Status: None (Preliminary result)   Collection Time: 04/02/19  1:04 PM   Specimen: Bronchial Washing, Left; Respiratory  Result Value Ref Range Status   Specimen Description BRONCHIAL ALVEOLAR LAVAGE LEFT  Final   Special Requests PT ON ZITHROMAX ZOSYN VANC  Final   Gram Stain   Final    FEW WBC PRESENT, PREDOMINANTLY PMN NO ORGANISMS SEEN    Culture   Final    NO GROWTH < 24 HOURS Performed at [facility], [place] [address] [place], [place] [address]    Report Status PENDING  Incomplete  Aerobic/Anaerobic Culture (surgical/deep wound)     Status: None (Preliminary result)   Collection Time: 04/02/19  1:38 PM   Specimen: Pleural, Left; Tissue  Result Value Ref Range Status   Specimen Description TISSUE PLEURAL LEFT  Final   Special Requests SPEC  C  Final   Gram Stain   Final    RARE WBC PRESENT, PREDOMINANTLY PMN NO ORGANISMS SEEN    Culture   Final    NO GROWTH < 24 HOURS Performed at [facility], [place] [address] [place], [place] [address]    Report Status PENDING  Incomplete  MRSA PCR Screening     Status: None   Collection Time: 04/02/19  6:20 PM   Specimen: Nasal Mucosa; Nasopharyngeal  Result Value Ref Range Status   MRSA by PCR NEGATIVE NEGATIVE Final    Comment:        The GeneXpert MRSA Assay (FDA approved for NASAL specimens only), is one component of a comprehensive MRSA colonization surveillance program. It is not intended to diagnose MRSA infection nor to guide or monitor treatment for MRSA infections. Performed at [facility], [place] [address], [place], [place] [address]          Radiology Studies: DG CHEST PORT 1 VIEW  Result Date: 04/03/2019  CLINICAL DATA:  Post lung surgery, chest tubes EXAM: PORTABLE CHEST 1 VIEW COMPARISON:  04/02/2019 FINDINGS: Left chest tubes remain in place. No pneumothorax. Moderate left pleural effusion with left base atelectasis. Heart is borderline in size. Mild vascular congestion. No confluent opacity on the right. IMPRESSION: Left chest tubes in stable position.  No pneumothorax. Moderate left pleural effusion with left base atelectasis. Mild vascular congestion. Electronically Signed   By: Rolm Baptise M.D.   On: 04/03/2019 09:14   DG Chest Port 1 View  Result Date: 04/02/2019 CLINICAL DATA:  Left empyema. Status post drainage of empyema and decortication. EXAM: PORTABLE CHEST 1 VIEW COMPARISON:  Chest x-ray dated 04/01/2019 and chest CT dated 03/28/2019 FINDINGS: 2 left-sided chest tubes are now in place. No pneumothorax. Significant improvement in the aeration of the left lung after drainage of the empyema. There is residual atelectasis at the left lung base. Right lung is clear. Large hiatal hernia. Heart size and vascularity are normal. No acute bone  abnormality. IMPRESSION: Significant improvement in the aeration of the left lung after drainage of the empyema. Electronically Signed   By: Lorriane Shire M.D.   On: 04/02/2019 15:35        Scheduled Meds: . acetaminophen  1,000 mg Oral Q6H   Or  . acetaminophen (TYLENOL) oral liquid 160 mg/5 mL  1,000 mg Oral Q6H  . bisacodyl  10 mg Oral Daily  . buPROPion  300 mg Oral Daily  . Chlorhexidine Gluconate Cloth  6 each Topical Daily  . enoxaparin (LOVENOX) injection  30 mg Subcutaneous Q24H  . ezetimibe  10 mg Oral QPM  . fentaNYL   Intravenous Q4H  . FLUoxetine  10 mg Oral Daily  . guaiFENesin  600 mg Oral BID  . influenza vaccine adjuvanted  0.5 mL Intramuscular Tomorrow-1000  . insulin aspart  0-15 Units Subcutaneous TID WC  . ketorolac  30 mg Intravenous Q6H  . pantoprazole  40 mg Oral BID  . rosuvastatin  40 mg Oral q1800  . senna-docusate  1 tablet Oral QHS   Continuous Infusions: . sodium chloride    . ampicillin-sulbactam (UNASYN) IV 3 g (04/03/19 0752)     LOS: 6 days    Time spent: 25 minutes    Edwin Dada, MD [facility] 04/03/2019, 3:25 PM     Please page though [place] or Epic secure chat:  For [employer], Adult nurse

## 2019-04-03 NOTE — Progress Notes (Addendum)
      [address]       [place] [address]             [phone]       1 Day Post-Op Procedure(s) (LRB): VIDEO BRONCHOSCOPY (N/A) VIDEO ASSISTED THORACOSCOPY (VATS)/DECORTICATION, Drainage of Emypema. (Left)  Subjective: Pain at chest tube sites this am.  Objective: Vital signs in last 24 hours: Temp:  [97.8 F (36.6 C)-98.3 F (36.8 C)] 98.2 F (36.8 C) (03/16 0400) Pulse Rate:  [85-96] 93 (03/16 0400) Cardiac Rhythm: Normal sinus rhythm (03/15 2030) Resp:  [14-23] 16 (03/16 0400) BP: (102-128)/(61-79) 115/62 (03/16 0400) SpO2:  [90 %-98 %] 96 % (03/16 0400) Arterial Line BP: (95-149)/(71-107) 146/71 (03/15 1800) FiO2 (%):  [97 %] 97 % (03/15 1656)     Intake/Output from previous day: 03/15 0701 - 03/16 0700 In: 1050 [I.V.:1000; IV Piggyback:50] Out: 5621 [Urine:1275; Blood:30; Chest Tube:180]   Physical Exam:  Cardiovascular: RRR Pulmonary: Clear to auscultation on the right and slightly diminished left base Abdomen: Soft, non tender, bowel sounds present. Extremities: Trace bilateral lower extremity edema. Wounds: Clean and dry.  No erythema or signs of infection. Chest Tubes: to suction, no air leak  Lab Results: CBC: Recent Labs    04/02/19 0306 04/03/19 0245  WBC 11.8* 21.4*  HGB 9.8* 10.1*  HCT 31.0* 32.0*  PLT 810* 700*   BMET:  Recent Labs    04/02/19 0306 04/03/19 0245  NA 140 138  K 3.8 3.4*  CL 104 99  CO2 24 29  GLUCOSE 98 157*  BUN <5* <5*  CREATININE 0.72 0.63  CALCIUM 8.3* 8.4*    PT/INR:  Recent Labs    04/02/19 0306  LABPROT 15.4*  INR 1.2   ABG:  INR: Will add last result for INR, ABG once components are confirmed Will add last 4 CBG results once components are confirmed  Assessment/Plan:  1. CV - SR with HR in the 90's.  2.  Pulmonary - ABG results: Ph=7.427, PCO2=45.9, PO2=77.2, HCO3=29.8. On 5 liters of oxygen via [place]. Wean as able over the next few days. Chest tubes with 180 cc of output  since surgery. Chest tubes are to suction and there is no air leak. CXR this am appears stable-? Small left apical ptx, left base atelectasis. Encourage incentive spirometer. 3. ID-Leukocytosis as WBC 21,400. On Vancomycin and Unasyn. Left pleural fluid gram stain shows no organisms and culture pending. 4. Hypokalemia-supplement potassium 5. Expected ABL anemia-H and H this am stable at 10.1 and 32 6. Decrease IVF, remove foley, advance diet 7. DM-will order accu checks, SS PRN. Will restart Metformin once tolerating oral better 8. Will give a few doses of Toradol to help with pain  [name] 04/03/2019,7:13 AM [phone]  I have seen and examined Brooke Ellis and agree with the above assessment  and plan. Large hiatal hernia, increased risk of aspiration, have reviewed with patient reflux precautions , and not to eat laying in the bed   [name] MD [place] Office [phone] 04/03/2019 7:49 AM

## 2019-04-03 NOTE — Plan of Care (Signed)

## 2019-04-04 ENCOUNTER — Inpatient Hospital Stay (HOSPITAL_COMMUNITY): Payer: Medicare Other

## 2019-04-04 DIAGNOSIS — J189 Pneumonia, unspecified organism: Secondary | ICD-10-CM

## 2019-04-04 DIAGNOSIS — D649 Anemia, unspecified: Secondary | ICD-10-CM

## 2019-04-04 DIAGNOSIS — D72825 Bandemia: Secondary | ICD-10-CM

## 2019-04-04 DIAGNOSIS — K22719 Barrett's esophagus with dysplasia, unspecified: Secondary | ICD-10-CM

## 2019-04-04 DIAGNOSIS — F39 Unspecified mood [affective] disorder: Secondary | ICD-10-CM

## 2019-04-04 LAB — CBC
HCT: 31.9 % — ABNORMAL LOW (ref 36.0–46.0)
Hemoglobin: 10 g/dL — ABNORMAL LOW (ref 12.0–15.0)
MCH: 26.2 pg (ref 26.0–34.0)
MCHC: 31.3 g/dL (ref 30.0–36.0)
MCV: 83.7 fL (ref 80.0–100.0)
Platelets: 674 10*3/uL — ABNORMAL HIGH (ref 150–400)
RBC: 3.81 MIL/uL — ABNORMAL LOW (ref 3.87–5.11)
RDW: 14.4 % (ref 11.5–15.5)
WBC: 13.9 10*3/uL — ABNORMAL HIGH (ref 4.0–10.5)
nRBC: 0 % (ref 0.0–0.2)

## 2019-04-04 LAB — ACID FAST SMEAR (AFB, MYCOBACTERIA)
Acid Fast Smear: NEGATIVE
Acid Fast Smear: NEGATIVE
Acid Fast Smear: NEGATIVE

## 2019-04-04 LAB — CULTURE, RESPIRATORY W GRAM STAIN: Culture: NO GROWTH

## 2019-04-04 LAB — COMPREHENSIVE METABOLIC PANEL
ALT: 38 U/L (ref 0–44)
AST: 38 U/L (ref 15–41)
Albumin: 1.6 g/dL — ABNORMAL LOW (ref 3.5–5.0)
Alkaline Phosphatase: 136 U/L — ABNORMAL HIGH (ref 38–126)
Anion gap: 13 (ref 5–15)
BUN: 5 mg/dL — ABNORMAL LOW (ref 8–23)
CO2: 30 mmol/L (ref 22–32)
Calcium: 8.5 mg/dL — ABNORMAL LOW (ref 8.9–10.3)
Chloride: 99 mmol/L (ref 98–111)
Creatinine, Ser: 0.68 mg/dL (ref 0.44–1.00)
GFR calc Af Amer: >60 mL/min (ref >60)
GFR calc non Af Amer: >60 mL/min (ref >60)
Glucose, Bld: 97 mg/dL (ref 70–99)
Potassium: 3.2 mmol/L — ABNORMAL LOW (ref 3.5–5.1)
Sodium: 142 mmol/L (ref 135–145)
Total Bilirubin: 0.4 mg/dL (ref 0.3–1.2)
Total Protein: 5.7 g/dL — ABNORMAL LOW (ref 6.5–8.1)

## 2019-04-04 LAB — MAGNESIUM: Magnesium: 1.7 mg/dL (ref 1.7–2.4)

## 2019-04-04 LAB — HEMOGLOBIN A1C
Hgb A1c MFr Bld: 6.6 % — ABNORMAL HIGH (ref 4.8–5.6)
Mean Plasma Glucose: 142.72 mg/dL

## 2019-04-04 LAB — GLUCOSE, CAPILLARY
Glucose-Capillary: 83 mg/dL (ref 70–99)
Glucose-Capillary: 80 mg/dL (ref 70–99)
Glucose-Capillary: 83 mg/dL (ref 70–99)
Glucose-Capillary: 115 mg/dL — ABNORMAL HIGH (ref 70–99)

## 2019-04-04 MED ORDER — MAGNESIUM SULFATE 2 GM/50ML IV SOLN
2.0000 g | Freq: Once | INTRAVENOUS | Status: AC
  Administered 2019-04-04: 2 g via INTRAVENOUS
  Filled 2019-04-04: qty 50

## 2019-04-04 MED ORDER — POTASSIUM CHLORIDE CRYS ER 20 MEQ PO TBCR
40.0000 meq | EXTENDED_RELEASE_TABLET | Freq: Two times a day (BID) | ORAL | Status: AC
  Administered 2019-04-04 (×2): 40 meq via ORAL
  Filled 2019-04-04 (×2): qty 2

## 2019-04-04 MED ORDER — OXYCODONE HCL 5 MG PO TABS: 5.0000 mg | ORAL_TABLET | ORAL | Status: DC | PRN

## 2019-04-04 NOTE — Discharge Instructions (Signed)
ACTIVITY:  1.Increase activity slowly. 2.Walk daily and increase frequency and duration as tolerates. 3.May walk up steps. 4.No lifting more than ten pounds for two weeks. 5.No driving for two weeks. 6.Avoid straining. 7.STOP any activity that causes chest pain, shortness of breath, dizziness,sweating,     or excessive weakness. 8.Continue with breathing exercises daily.   WOUND:  1.May shower. 2.Clean wounds with mild soap and water.  Call the office at [phone] if any problems arise.       

## 2019-04-04 NOTE — Progress Notes (Addendum)
      [address]       [place] [address]             [phone]       2 Days Post-Op Procedure(s) (LRB): VIDEO BRONCHOSCOPY (N/A) VIDEO ASSISTED THORACOSCOPY (VATS)/DECORTICATION, Drainage of Emypema. (Left)  Subjective: Incisional pain and pain at chest tubes  Objective: Vital signs in last 24 hours: Temp:  [98 F (36.7 C)-98.4 F (36.9 C)] 98.4 F (36.9 C) (03/17 0500) Pulse Rate:  [86-98] 87 (03/17 0500) Cardiac Rhythm: Normal sinus rhythm (03/16 2100) Resp:  [13-20] 18 (03/17 0500) BP: (100-120)/(61-72) 120/72 (03/17 0500) SpO2:  [93 %-98 %] 94 % (03/17 0500) Arterial Line BP: (146)/(67) 146/67 (03/16 0800)     Intake/Output from previous day: 03/16 0701 - 03/17 0700 In: -  Out: 610 [Urine:520; Chest Tube:90]   Physical Exam:  Cardiovascular: RRR Pulmonary: Clear to auscultation on the right and  diminished left base Abdomen: Soft, non tender, bowel sounds present. Extremities: No lower extremity edema. Wounds: Dressing around chest tube wound has some dried bloody drainage.   Chest Tubes: to suction, no air leak  Lab Results: CBC: Recent Labs    04/03/19 0245 04/04/19 0219  WBC 21.4* 13.9*  HGB 10.1* 10.0*  HCT 32.0* 31.9*  PLT 700* 674*   BMET:  Recent Labs    04/03/19 0245 04/04/19 0219  NA 138 142  K 3.4* 3.2*  CL 99 99  CO2 29 30  GLUCOSE 157* 97  BUN <5* 5*  CREATININE 0.63 0.68  CALCIUM 8.4* 8.5*    PT/INR:  Recent Labs    04/02/19 0306  LABPROT 15.4*  INR 1.2   ABG:  INR: Will add last result for INR, ABG once components are confirmed Will add last 4 CBG results once components are confirmed  Assessment/Plan:  1. CV - SR with HR in the 80's.  2.  Pulmonary -  On 5 liters of oxygen via [place]. Wean as able over the next few days. Chest tubes with 180 cc of output since surgery. Chest tubes are to suction and there is no air leak. CXR this am appears to show moderate left pleural effusion, left base  atelectasis. Continue Mucinex. Encourage incentive spirometer. 3. ID-WBC decreased to 13,900. On Vancomycin and Unasyn. Left pleural fluid gram stain shows no organisms culture no growth thus far. 4. Hypokalemia-supplement potassium. Check Mg 5. Expected ABL anemia-H and H this am stable at 10 and 31.9 7. CBGs 89/103/83. HGA1C 6.6. On Metformin prior to admission, which patient states is for weight loss not diabetes.  Likely restart at discharge;per primary 8. Patient and nurse instructed to ambulate patient several times daily   Brooke M ZimmermanPA-C 04/04/2019,7:27 AM [phone]  Will d/c one chest tube  Cultures negative so far  I have seen and examined Brooke Ellis and agree with the above assessment  and plan.  [name] MD [place] [phone] Office [phone] 04/04/2019 8:23 AM

## 2019-04-04 NOTE — Progress Notes (Signed)
Physical Therapy Re-Evaluation and Treatment Patient Details Name: Brooke Ellis MRN: [id number] DOB: 1950/05/08 Today's Date: 04/04/2019    History of Present Illness 69 y.o. female with medical history significant of  Depression, asthma, hyperlipidemia, impaired glucose tolerance,  GERD, anxiety presents to [facility] for worsening sob, with cough and chills, generalized malaise for over 2 weeks. Dx of PNA, pleural effusion, ? necrotic lung mass. s/p VATS 04/03/19    PT Comments    RN in room on PT arrival getting ready to remove chest tube, however pt and RN agreeable to ambulation prior to removal. Pt is motivated to get out of hospital and as such moves a little quickly for conditions but readjusts with verbal cuing. Pt continues to be mod I for bed mobility, and supervision for transfers and ambulation of 300 feet with RW. Use of RW primarily to hold chest tube, will work toward independent ambulation once chest tubes removed. D/c plans remain appropriate. PT will continue to follow acutely.    Follow Up Recommendations  No PT follow up     Equipment Recommendations  Other (comment);3in1 (PT)(rollator)    Recommendations for Other Services       Precautions / Restrictions Precautions Precautions: Other (comment) Precaution Comments: monitor O2; pt denies h/o falls in past 1 year Restrictions Weight Bearing Restrictions: No    Mobility  Bed Mobility Overal bed mobility: Modified Independent             General bed mobility comments: increased time due to pain on L side with movement  Transfers Overall transfer level: Needs assistance Equipment used: None Transfers: Sit to/from Stand Sit to Stand: Supervision         General transfer comment: slow, steady ascent into standing with supervision, able to steady independently before reaching for RW  Ambulation/Gait Ambulation/Gait assistance: Supervision Gait Distance (Feet): 300 Feet Assistive device: None Gait  Pattern/deviations: Step-through pattern;Decreased stride length Gait velocity: too fast for conditions Gait velocity interpretation: 1.31 - 2.62 ft/sec, indicative of limited community ambulator General Gait Details: supervision for safety, vc for decreasing pace and increasing pursed lipped breathing,        Balance Overall balance assessment: Independent                                          Cognition Arousal/Alertness: Awake/alert Behavior During Therapy: WFL for tasks assessed/performed Overall Cognitive Status: Within Functional Limits for tasks assessed                                           General Comments General comments (skin integrity, edema, etc.): Ambulated on RA dropped to 83%SaO2,  with standing rest break and pursed lipped breathing rebounded to 92%O2 within 2 minutes, able to ambulate at slower pace with return to room and able to maintain SaO2 >87%O2, cuing for pursed lipped breathing after ambulation and SaO2 rebounded to 92%      Pertinent Vitals/Pain Pain Assessment: 0-10 Pain Score: 2  Pain Location: L side (chest tube insertion), pt reports decreased pain with ambulation  Pain Descriptors / Indicators: Aching;Sore Pain Intervention(s): Limited activity within patient's tolerance;Monitored during session;Repositioned           PT Goals (current goals can now be found in the care plan section) Acute Rehab  PT Goals Patient Stated Goal: decrease pain PT Goal Formulation: With patient Time For Goal Achievement: 04/12/19 Potential to Achieve Goals: Good Progress towards PT goals: Progressing toward goals    Frequency    Min 3X/week      PT Plan Current plan remains appropriate       AM-PAC PT "6 Clicks" Mobility   Outcome Measure  Help needed turning from your back to your side while in a flat bed without using bedrails?: None Help needed moving from lying on your back to sitting on the side of a flat  bed without using bedrails?: None Help needed moving to and from a bed to a chair (including a wheelchair)?: None Help needed standing up from a chair using your arms (e.g., wheelchair or bedside chair)?: None Help needed to walk in hospital room?: None Help needed climbing 3-5 steps with a railing? : A Little 6 Click Score: 23    End of Session   Activity Tolerance: Patient tolerated treatment well Patient left: in bed;with call bell/phone within reach;with family/visitor present;Other (comment)(in bed for chest tube removal ) Nurse Communication: Mobility status;Other (comment)(O2 sat on RA, pt returned to 1LPM O2 at EOS) PT Visit Diagnosis: Difficulty in walking, not elsewhere classified (R26.2);Pain     Time: 3235-5732 PT Time Calculation (min) (ACUTE ONLY): 27 min  Charges:  $Gait Training: 8-22 mins                     Marvelle Span B. Migdalia Dk PT, DPT Acute Rehabilitation Services Pager [phone] Office [phone]    [place] 04/04/2019, 11:23 AM

## 2019-04-04 NOTE — Plan of Care (Signed)

## 2019-04-04 NOTE — Progress Notes (Signed)
Anterior chest tube pulled by this RN. No complications noted. Pt resting in bed comfortably with posterior chest tube intact to suction. VSS. Will continue to monitor.

## 2019-04-04 NOTE — Progress Notes (Signed)
PROGRESS NOTE  Brooke Ellis NFA:[id number] DOB: 09-Aug-1950   PCP: Derinda Late, MD  Patient is from: Home  DOA: 03/28/2019 LOS: 7  Brief Narrative / Interim history: 69 year old female with history of prediabetes, IDA, GERD and Barrett's esophagus presented with cough, pleuritic chest pain and systemic symptoms and admitted for pneumonia and empyema of left lung.   She underwent VATS procedure by Dr. Servando Snare on 3/15 with 2 chest tube placements.   Subjective: No major events overnight or this morning.  No complaints other than "some" pain from chest tube.  She denies dyspnea, GI or UTI symptoms.  She had a total of 90 cc output from both chest tubes in the last 24 hours.  Objective: Vitals:   04/03/19 1940 04/03/19 2349 04/04/19 0500 04/04/19 0751  BP: 100/62 101/61 120/72 131/71  Pulse: 86 98 87 91  Resp: '15 17 18 19  '$ Temp: 98 F (36.7 C) 98.2 F (36.8 C) 98.4 F (36.9 C) 98.3 F (36.8 C)  TempSrc: Oral Oral Oral Oral  SpO2: 96% 93% 94% 95%  Weight:      Height:        Intake/Output Summary (Last 24 hours) at 04/04/2019 1332 Last data filed at 04/04/2019 0400 Gross per 24 hour  Intake --  Output 90 ml  Net -90 ml   Filed Weights   03/28/19 1155 03/28/19 1702 04/01/19 2009  Weight: 66 kg 63.5 kg 68.4 kg    Examination:  GENERAL: No acute distress.  Appears well.  HEENT: MMM.  Vision and hearing grossly intact.  NECK: Supple.  No apparent JVD.  RESP: On room air.  No IWOB.  Diminished aeration on the left.  Chest tube on the left side. CVS:  RRR. Heart sounds normal.  ABD/GI/GU: Bowel sounds present. Soft. Non tender.  MSK/EXT:  Moves extremities. No apparent deformity. No edema.  SKIN: no apparent skin lesion or wound NEURO: Awake, alert and oriented appropriately.  No apparent focal neuro deficit. PSYCH: Calm. Normal affect.  Procedures:  3/15-VATS procedure with chest tube placement  Assessment & Plan: Acute respiratory failure with hypoxia due to  pneumonia/empyema: Requiring 5 L by nasal cannula. -Encourage incentive spirometry -Wean oxygen as able -Treat pneumonia/empyema as below  Pneumonia with empyema- -s/p VATS and chest tube placement by Dr. Servando Snare on 3/15 -Blood and pleural fluid cultures and MRSA PCR negative. -Follow fluid AFB and fungal culture -Continue IV Unasyn -Add probiotics  Leukocytosis/bandemia: Likely due to the above.  Improving. -Continue trending  Diet controlled DM-2: A1c 6.6%. Recent Labs    04/03/19 2104 04/04/19 0614 04/04/19 1049  GLUCAP 103* 83 80  -Continue SSI -Continue Zetia and Crestor  Diet esophagus -Continue PPI  Hypokalemia -Replenish and recheck  Mood disorder: Stable -Continue home Prozac and Wellbutrin  Normocytic anemia: H&H stable. -Continue monitoring -Check anemia panel               DVT prophylaxis: Subcu Lovenox Code Status: Full code Family Communication: Patient and/or RN. Available if any question.  Discharge barrier: Respiratory failure with hypoxia, and chest tube for empyema Patient is from: Home Final disposition: Likely home once medically stable and cleared by CTS.  Consultants: CTS   Microbiology summarized: COVID-19 negative Blood cultures negative Pleural fluid cultures negative MRSA PCR negative  Sch Meds:  Scheduled Meds: . acetaminophen  1,000 mg Oral Q6H   Or  . acetaminophen (TYLENOL) oral liquid 160 mg/5 mL  1,000 mg Oral Q6H  . bisacodyl  10 mg Oral  Daily  . buPROPion  300 mg Oral Daily  . Chlorhexidine Gluconate Cloth  6 each Topical Daily  . enoxaparin (LOVENOX) injection  30 mg Subcutaneous Q24H  . ezetimibe  10 mg Oral QPM  . fentaNYL   Intravenous Q4H  . FLUoxetine  10 mg Oral Daily  . guaiFENesin  600 mg Oral BID  . influenza vaccine adjuvanted  0.5 mL Intramuscular Tomorrow-1000  . insulin aspart  0-15 Units Subcutaneous TID WC  . ketorolac  30 mg Intravenous Q6H  . pantoprazole  40 mg Oral BID  . potassium  chloride  40 mEq Oral BID  . rosuvastatin  40 mg Oral q1800  . senna-docusate  1 tablet Oral QHS   Continuous Infusions: . ampicillin-sulbactam (UNASYN) IV 3 g (04/04/19 0800)   PRN Meds:.diphenhydrAMINE **OR** diphenhydrAMINE, naloxone **AND** sodium chloride flush, ondansetron (ZOFRAN) IV, oxyCODONE, traMADol  Antimicrobials: Anti-infectives (From admission, onward)   Start     Dose/Rate Route Frequency Ordered Stop   04/03/19 0800  vancomycin (VANCOREADY) IVPB 750 mg/150 mL  Status:  Discontinued     750 mg 150 mL/hr over 60 Minutes Intravenous Every 12 hours 04/02/19 1748 04/03/19 0744   04/02/19 2000  Ampicillin-Sulbactam (UNASYN) 3 g in sodium chloride 0.9 % 100 mL IVPB     3 g 200 mL/hr over 30 Minutes Intravenous Every 6 hours 04/02/19 1848     04/02/19 1315  piperacillin-tazobactam (ZOSYN) IVPB 3.375 g     3.375 g 100 mL/hr over 30 Minutes Intravenous To Surgery 04/02/19 1305 04/02/19 1355   03/29/19 1200  azithromycin (ZITHROMAX) 500 mg in sodium chloride 0.9 % 250 mL IVPB  Status:  Discontinued     500 mg 250 mL/hr over 60 Minutes Intravenous Every 24 hours 03/28/19 1743 04/02/19 1630   03/28/19 2200  piperacillin-tazobactam (ZOSYN) IVPB 3.375 g  Status:  Discontinued     3.375 g 12.5 mL/hr over 240 Minutes Intravenous Every 8 hours 03/28/19 2101 04/02/19 1630   03/28/19 2000  piperacillin-tazobactam (ZOSYN) IVPB 3.375 g  Status:  Discontinued     3.375 g 12.5 mL/hr over 240 Minutes Intravenous Every 8 hours 03/28/19 1853 03/28/19 2101   03/28/19 1800  piperacillin-tazobactam (ZOSYN) IVPB 3.375 g  Status:  Discontinued     3.375 g 12.5 mL/hr over 240 Minutes Intravenous Every 8 hours 03/28/19 1743 03/28/19 1853   03/28/19 1800  vancomycin (VANCOREADY) IVPB 1250 mg/250 mL     1,250 mg 166.7 mL/hr over 90 Minutes Intravenous  Once 03/28/19 1747 03/28/19 2018   03/28/19 1800  vancomycin (VANCOCIN) IVPB 1000 mg/200 mL premix  Status:  Discontinued     1,000 mg 200 mL/hr  over 60 Minutes Intravenous Every 24 hours 03/28/19 1752 03/28/19 1853   03/28/19 1800  vancomycin (VANCOCIN) IVPB 1000 mg/200 mL premix  Status:  Discontinued     1,000 mg 200 mL/hr over 60 Minutes Intravenous Every 24 hours 03/28/19 1853 04/02/19 1630   03/28/19 1415  clindamycin (CLEOCIN) IVPB 600 mg     600 mg 100 mL/hr over 30 Minutes Intravenous  Once 03/28/19 1405 03/28/19 1504   03/28/19 1300  cefTRIAXone (ROCEPHIN) 1 g in sodium chloride 0.9 % 100 mL IVPB     1 g 200 mL/hr over 30 Minutes Intravenous  Once 03/28/19 1256 03/28/19 1437   03/28/19 1300  azithromycin (ZITHROMAX) 500 mg in sodium chloride 0.9 % 250 mL IVPB     500 mg 250 mL/hr over 60 Minutes Intravenous  Once 03/28/19  1256 03/28/19 1519       I have personally reviewed the following labs and images: CBC: Recent Labs  Lab 03/31/19 0546 04/01/19 0521 04/02/19 0306 04/03/19 0245 04/04/19 0219  WBC 16.2* 12.4* 11.8* 21.4* 13.9*  HGB 10.6* 10.1* 9.8* 10.1* 10.0*  HCT 34.4* 32.1* 31.0* 32.0* 31.9*  MCV 85.1 83.8 83.8 83.6 83.7  PLT 595* 583* 810* 700* 674*   BMP &GFR Recent Labs  Lab 03/29/19 0437 03/30/19 0447 03/30/19 1147 03/30/19 1147 03/31/19 0546 04/01/19 0521 04/02/19 0306 04/03/19 0245 04/04/19 0219  NA 134*  --  134*   < > 137 141 140 138 142  K 3.3*  --  2.8*   < > 3.6 3.5 3.8 3.4* 3.2*  CL 95*  --  95*   < > 99 102 104 99 99  CO2 30  --  26   < > '25 28 24 29 30  '$ GLUCOSE 125*  --  100*   < > 86 122* 98 157* 97  BUN 6*  --  9   < > 7* 6* <5* <5* 5*  CREATININE 0.73   < > 0.64   < > 0.59 0.47 0.72 0.63 0.68  CALCIUM 8.1*  --  8.1*   < > 8.6* 8.4* 8.3* 8.4* 8.5*  MG 2.2  --  2.0  --  1.9  --   --   --  1.7  PHOS  --   --   --   --  2.6  --   --   --   --    < > = values in this interval not displayed.   Estimated Creatinine Clearance: 58.1 mL/min (by C-G formula based on SCr of 0.68 mg/dL). Liver & Pancreas: Recent Labs  Lab 03/29/19 0437 03/31/19 0546 04/02/19 0306 04/04/19 0219    AST 27 129* 82* 38  ALT 24 64* 62* 38  ALKPHOS 139* 137* 146* 136*  BILITOT 0.6 0.9 0.4 0.4  PROT 6.3* 6.0* 5.7* 5.7*  ALBUMIN 2.3* 2.1* 1.7* 1.6*   No results for input(s): LIPASE, AMYLASE in the last 168 hours. No results for input(s): AMMONIA in the last 168 hours. Diabetic: Recent Labs    04/04/19 0219  HGBA1C 6.6*   Recent Labs  Lab 04/03/19 1149 04/03/19 1651 04/03/19 2104 04/04/19 0614 04/04/19 1049  GLUCAP 97 89 103* 83 80   Cardiac Enzymes: No results for input(s): CKTOTAL, CKMB, CKMBINDEX, TROPONINI in the last 168 hours. No results for input(s): PROBNP in the last 8760 hours. Coagulation Profile: Recent Labs  Lab 03/29/19 1029 04/02/19 0306  INR 1.3* 1.2   Thyroid Function Tests: No results for input(s): TSH, T4TOTAL, FREET4, T3FREE, THYROIDAB in the last 72 hours. Lipid Profile: No results for input(s): CHOL, HDL, LDLCALC, TRIG, CHOLHDL, LDLDIRECT in the last 72 hours. Anemia Panel: No results for input(s): VITAMINB12, FOLATE, FERRITIN, TIBC, IRON, RETICCTPCT in the last 72 hours. Urine analysis:    Component Value Date/Time   COLORURINE YELLOW 04/02/2019 0700   APPEARANCEUR CLEAR 04/02/2019 0700   LABSPEC 1.019 04/02/2019 0700   PHURINE 6.0 04/02/2019 0700   GLUCOSEU NEGATIVE 04/02/2019 0700   HGBUR NEGATIVE 04/02/2019 0700   BILIRUBINUR NEGATIVE 04/02/2019 0700   KETONESUR 5 (A) 04/02/2019 0700   PROTEINUR 30 (A) 04/02/2019 0700   NITRITE NEGATIVE 04/02/2019 0700   LEUKOCYTESUR TRACE (A) 04/02/2019 0700   Sepsis Labs: Invalid input(s): PROCALCITONIN, [place]  Microbiology: Recent Results (from the past 240 hour(s))  Blood Culture (routine  x 2)     Status: None   Collection Time: 03/28/19 12:06 PM   Specimen: BLOOD  Result Value Ref Range Status   Specimen Description BLOOD RIGHT ANTECUBITAL  Final   Special Requests   Final    BOTTLES DRAWN AEROBIC AND ANAEROBIC Blood Culture adequate volume Performed at [facility],  [address] [place], [place] [address]    Culture NO GROWTH 5 DAYS  Final   Report Status 04/02/2019 FINAL  Final  Blood Culture (routine x 2)     Status: None   Collection Time: 03/28/19 12:06 PM   Specimen: BLOOD  Result Value Ref Range Status   Specimen Description BLOOD LEFT ANTECUBITAL  Final   Special Requests   Final    BOTTLES DRAWN AEROBIC AND ANAEROBIC Blood Culture adequate volume Performed at [facility], [address] [place], [place] [address]    Culture NO GROWTH 5 DAYS  Final   Report Status 04/02/2019 FINAL  Final  SARS Coronavirus 2 Ag (30 min TAT) - Nasal Swab (BD Veritor Kit)     Status: None   Collection Time: 03/28/19 12:06 PM   Specimen: Nasal Swab (BD Veritor Kit)  Result Value Ref Range Status   SARS Coronavirus 2 Ag NEGATIVE NEGATIVE Final    Comment: (NOTE) SARS-CoV-2 antigen NOT DETECTED.  Negative results are presumptive.  Negative results do not preclude SARS-CoV-2 infection and should not be used as the sole basis for treatment or other patient management decisions, including infection  control decisions, particularly in the presence of clinical signs and  symptoms consistent with COVID-19, or in those who have been in contact with the virus.  Negative results must be combined with clinical observations, patient history, and epidemiological information. The expected result is Negative. Fact Sheet for Patients: PodPark.tn Fact Sheet for Healthcare Providers: GiftContent.is This test is not yet approved or cleared by the [place] FDA and  has been authorized for detection and/or diagnosis of SARS-CoV-2 by FDA under an Emergency Use Authorization (EUA).  This EUA will remain in effect (meaning this test can be used) for the duration of  the COVID-19 de claration under Section 564(b)(1) of the Act, 21 U.S.C. section 360bbb-3(b)(1), unless the authorization is terminated  or revoked sooner. Performed at [facility], [address] [place], [place] [address]   Respiratory Panel by RT PCR (Flu A&B, Covid) - Nasopharyngeal Swab     Status: None   Collection Time: 03/28/19  1:17 PM   Specimen: Nasopharyngeal Swab  Result Value Ref Range Status   SARS Coronavirus 2 by RT PCR NEGATIVE NEGATIVE Final    Comment: (NOTE) SARS-CoV-2 target nucleic acids are NOT DETECTED. The SARS-CoV-2 RNA is generally detectable in upper respiratoy specimens during the acute phase of infection. The lowest concentration of SARS-CoV-2 viral copies this assay can detect is 131 copies/mL. A negative result does not preclude SARS-Cov-2 infection and should not be used as the sole basis for treatment or other patient management decisions. A negative result may occur with  improper specimen collection/handling, submission of specimen other than nasopharyngeal swab, presence of viral mutation(s) within the areas targeted by this assay, and inadequate number of viral copies (<131 copies/mL). A negative result must be combined with clinical observations, patient history, and epidemiological information. The expected result is Negative. Fact Sheet for Patients:  PinkCheek.be Fact Sheet for Healthcare Providers:  GravelBags.it This test is not yet ap proved or  cleared by the [place] and  has been authorized for detection and/or diagnosis of SARS-CoV-2 by FDA under an Emergency Use Authorization (EUA). This EUA will remain  in effect (meaning this test can be used) for the duration of the COVID-19 declaration under Section 564(b)(1) of the Act, 21 U.S.C. section 360bbb-3(b)(1), unless the authorization is terminated or revoked sooner.    Influenza A by PCR NEGATIVE NEGATIVE Final   Influenza B by PCR NEGATIVE NEGATIVE Final    Comment: (NOTE) The Xpert Xpress SARS-CoV-2/FLU/RSV assay is intended as an aid  in  the diagnosis of influenza from Nasopharyngeal swab specimens and  should not be used as a sole basis for treatment. Nasal washings and  aspirates are unacceptable for Xpert Xpress SARS-CoV-2/FLU/RSV  testing. Fact Sheet for Patients: PinkCheek.be Fact Sheet for Healthcare Providers: GravelBags.it This test is not yet approved or cleared by the [place] FDA and  has been authorized for detection and/or diagnosis of SARS-CoV-2 by  FDA under an Emergency Use Authorization (EUA). This EUA will remain  in effect (meaning this test can be used) for the duration of the  Covid-19 declaration under Section 564(b)(1) of the Act, 21  U.S.C. section 360bbb-3(b)(1), unless the authorization is  terminated or revoked. Performed at [facility], [address] [place], [place] [address]   Fungus Culture With Stain     Status: None (Preliminary result)   Collection Time: 03/29/19  3:40 PM   Specimen: PATH Cytology Pleural fluid  Result Value Ref Range Status   Fungus Stain Final report  Final    Comment: (NOTE) Performed At: [facility] [place], [place] [id number] Rush Farmer MD [id number]    Fungus (Mycology) Culture PENDING  Incomplete   Fungal Source PLEURAL  Final    Comment: Performed at [facility], [place] [address] [place], [place] [address]  Culture, body fluid-bottle     Status: None   Collection Time: 03/29/19  3:40 PM   Specimen: Pleura  Result Value Ref Range Status   Specimen Description PLEURAL  Final   Special Requests NONE  Final   Culture   Final    NO GROWTH 5 DAYS Performed at [facility], [address] [place], [place] [address]    Report Status 04/03/2019 FINAL  Final  Gram stain     Status: None   Collection Time: 03/29/19  3:40 PM   Specimen: Pleura  Result Value Ref Range Status   Specimen Description PLEURAL  Final   Special  Requests NONE  Final   Gram Stain   Final    MODERATE WBC PRESENT, PREDOMINANTLY PMN NO ORGANISMS SEEN Performed at [facility], [place] [address], [place],  [address]    Report Status 03/29/2019 FINAL  Final  Fungus Culture Result     Status: None   Collection Time: 03/29/19  3:40 PM  Result Value Ref Range Status   Result 1 Comment  Final    Comment: (NOTE) KOH/Calcofluor preparation:  no fungus observed. Performed At: [facility] [place], [place] [id number] Rush Farmer MD [id number]   Body fluid culture     Status: None (Preliminary result)   Collection Time: 04/02/19  1:02 PM   Specimen: Pleural, Left; Body Fluid  Result Value Ref Range Status   Specimen Description PLEURAL LEFT  Final   Special Requests NONE  Final   Gram Stain   Final  FEW WBC PRESENT, PREDOMINANTLY PMN RARE GRAM POSITIVE COCCI    Culture   Final    CULTURE REINCUBATED FOR BETTER GROWTH Performed at [facility], [place] [address], [place], [place] [address]    Report Status PENDING  Incomplete  Acid Fast Smear (AFB)     Status: None   Collection Time: 04/02/19  1:02 PM   Specimen: Pleural, Left; Body Fluid  Result Value Ref Range Status   AFB Specimen Processing Concentration  Final   Acid Fast Smear Negative  Final    Comment: (NOTE) Performed At: [facility] [place], [place] [id number] Rush Farmer MD [id number]    Source (AFB) PLEURAL  Final    Comment: LEFT Performed at [facility], [place] [address], [place], [place] [address]   Culture, respiratory     Status: None   Collection Time: 04/02/19  1:04 PM   Specimen: Bronchial Washing, Left; Respiratory  Result Value Ref Range Status   Specimen Description BRONCHIAL ALVEOLAR LAVAGE LEFT  Final   Special Requests PT ON ZITHROMAX ZOSYN VANC  Final   Gram Stain   Final    FEW WBC PRESENT, PREDOMINANTLY PMN NO ORGANISMS SEEN    Culture   Final    NO GROWTH 2  DAYS Performed at [facility], [place] [address], [place], [place] [address]    Report Status 04/04/2019 FINAL  Final  Acid Fast Smear (AFB)     Status: None   Collection Time: 04/02/19  1:04 PM   Specimen: Bronchial Washing, Left; Respiratory  Result Value Ref Range Status   AFB Specimen Processing Concentration  Final   Acid Fast Smear Negative  Final    Comment: (NOTE) Performed At: [facility] [place], [place] [id number] Rush Farmer MD [id number]    Source (AFB) BRONCHIAL ALVEOLAR LAVAGE  Final    Comment: LEFT Performed at [facility], [place] [address], [place], [place] [address]   Aerobic/Anaerobic Culture (surgical/deep wound)     Status: None (Preliminary result)   Collection Time: 04/02/19  1:38 PM   Specimen: Pleural, Left; Tissue  Result Value Ref Range Status   Specimen Description TISSUE PLEURAL LEFT  Final   Special Requests SPEC C  Final   Gram Stain   Final    RARE WBC PRESENT, PREDOMINANTLY PMN NO ORGANISMS SEEN    Culture   Final    NO GROWTH 2 DAYS Performed at [facility], [place] [address], [place], [place] [address]    Report Status PENDING  Incomplete  Acid Fast Smear (AFB)     Status: None   Collection Time: 04/02/19  1:38 PM   Specimen: Pleural, Left; Tissue  Result Value Ref Range Status   AFB Specimen Processing Comment  Final    Comment: Tissue Grinding and Digestion/Decontamination   Acid Fast Smear Negative  Final    Comment: (NOTE) Performed At: [facility] [place], [place] [id number] Rush Farmer MD [id number]    Source (AFB) TISSUE  Final    Comment: PLEURAL LEFT Performed at [facility], [place] [address], [place], [place] [address]   MRSA PCR Screening     Status: None   Collection Time: 04/02/19  6:20 PM   Specimen: Nasal Mucosa; Nasopharyngeal  Result Value Ref Range Status   MRSA by PCR NEGATIVE NEGATIVE Final    Comment:        The GeneXpert  MRSA  Assay (FDA approved for NASAL specimens only), is one component of a comprehensive MRSA colonization surveillance program. It is not intended to diagnose MRSA infection nor to guide or monitor treatment for MRSA infections. Performed at [facility], [place] [address] [place], [place] [address]     Radiology Studies: DG CHEST PORT 1 VIEW  Result Date: 04/04/2019 CLINICAL DATA:  Pneumothorax EXAM: PORTABLE CHEST 1 VIEW COMPARISON:  04/03/2019 FINDINGS: No significant change in AP portable radiographs with moderate pleural effusion, associated atelectasis or consolidation, and left-sided chest tubes in position. There is no significant pneumothorax. Trace right pleural effusion. No new airspace opacity. Cardiomegaly. IMPRESSION: No significant change in AP portable radiographs with moderate pleural effusion, associated atelectasis or consolidation, and left-sided chest tubes in position. There is no significant pneumothorax. Electronically Signed   By: Eddie Candle M.D.   On: 04/04/2019 09:22     Desman Polak T. [place]  If 7PM-7AM, please contact night-coverage [email or web address] Password [email or web address] 04/04/2019, 1:32 PM

## 2019-04-05 ENCOUNTER — Inpatient Hospital Stay (HOSPITAL_COMMUNITY): Payer: Medicare Other

## 2019-04-05 DIAGNOSIS — E1165 Type 2 diabetes mellitus with hyperglycemia: Secondary | ICD-10-CM

## 2019-04-05 LAB — FOLATE: Folate: 17.2 ng/mL (ref >5.9)

## 2019-04-05 LAB — RETICULOCYTES
Immature Retic Fract: 25.2 % — ABNORMAL HIGH (ref 2.3–15.9)
RBC.: 3.81 MIL/uL — ABNORMAL LOW (ref 3.87–5.11)
Retic Count, Absolute: 85.3 10*3/uL (ref 19.0–186.0)
Retic Ct Pct: 2.2 % (ref 0.4–3.1)

## 2019-04-05 LAB — RENAL FUNCTION PANEL
Albumin: 1.6 g/dL — ABNORMAL LOW (ref 3.5–5.0)
Anion gap: 11 (ref 5–15)
BUN: 5 mg/dL — ABNORMAL LOW (ref 8–23)
CO2: 29 mmol/L (ref 22–32)
Calcium: 8.1 mg/dL — ABNORMAL LOW (ref 8.9–10.3)
Chloride: 100 mmol/L (ref 98–111)
Creatinine, Ser: 0.6 mg/dL (ref 0.44–1.00)
GFR calc Af Amer: >60 mL/min (ref >60)
GFR calc non Af Amer: >60 mL/min (ref >60)
Glucose, Bld: 98 mg/dL (ref 70–99)
Phosphorus: 3.8 mg/dL (ref 2.5–4.6)
Potassium: 3.8 mmol/L (ref 3.5–5.1)
Sodium: 140 mmol/L (ref 135–145)

## 2019-04-05 LAB — CBC
HCT: 30.4 % — ABNORMAL LOW (ref 36.0–46.0)
Hemoglobin: 9.7 g/dL — ABNORMAL LOW (ref 12.0–15.0)
MCH: 26.6 pg (ref 26.0–34.0)
MCHC: 31.9 g/dL (ref 30.0–36.0)
MCV: 83.3 fL (ref 80.0–100.0)
Platelets: 685 10*3/uL — ABNORMAL HIGH (ref 150–400)
RBC: 3.65 MIL/uL — ABNORMAL LOW (ref 3.87–5.11)
RDW: 14.3 % (ref 11.5–15.5)
WBC: 11.2 10*3/uL — ABNORMAL HIGH (ref 4.0–10.5)
nRBC: 0 % (ref 0.0–0.2)

## 2019-04-05 LAB — GLUCOSE, CAPILLARY
Glucose-Capillary: 84 mg/dL (ref 70–99)
Glucose-Capillary: 80 mg/dL (ref 70–99)
Glucose-Capillary: 80 mg/dL (ref 70–99)
Glucose-Capillary: 122 mg/dL — ABNORMAL HIGH (ref 70–99)

## 2019-04-05 LAB — IRON AND TIBC
Iron: 23 ug/dL — ABNORMAL LOW (ref 28–170)
Saturation Ratios: 11 % (ref 10.4–31.8)
TIBC: 211 ug/dL — ABNORMAL LOW (ref 250–450)
UIBC: 188 ug/dL

## 2019-04-05 LAB — FERRITIN: Ferritin: 104 ng/mL (ref 11–307)

## 2019-04-05 LAB — VITAMIN B12: Vitamin B-12: 366 pg/mL (ref 180–914)

## 2019-04-05 LAB — MAGNESIUM: Magnesium: 1.8 mg/dL (ref 1.7–2.4)

## 2019-04-05 MED ORDER — SACCHAROMYCES BOULARDII 250 MG PO CAPS
250.0000 mg | ORAL_CAPSULE | Freq: Two times a day (BID) | ORAL | Status: DC
  Administered 2019-04-05 – 2019-04-07 (×5): 250 mg via ORAL
  Filled 2019-04-05 (×5): qty 1

## 2019-04-05 MED ORDER — POTASSIUM CHLORIDE CRYS ER 20 MEQ PO TBCR
20.0000 meq | EXTENDED_RELEASE_TABLET | Freq: Once | ORAL | Status: AC
  Administered 2019-04-05: 20 meq via ORAL
  Filled 2019-04-05: qty 1

## 2019-04-05 MED ORDER — FERROUS SULFATE 325 (65 FE) MG PO TABS
325.0000 mg | ORAL_TABLET | Freq: Two times a day (BID) | ORAL | Status: DC
  Administered 2019-04-05 – 2019-04-07 (×4): 325 mg via ORAL
  Filled 2019-04-05 (×4): qty 1

## 2019-04-05 NOTE — TOC Progression Note (Signed)
Transition of Care [facility]) - Progression Note    Patient Details  Name: Brooke Ellis MRN: [id number] Date of Birth: 07-13-1950  Transition of Care [facility]) CM/SW Contact  Zenon Mayo, RN Phone Number: 04/05/2019, 4:04 PM  Clinical Narrative:    NCM spoke with patient , she states she does not need a rollator or 3 n 1.  TOC team will continue to follow for dc needs.         Expected Discharge Plan and Services                                                 Social Determinants of Health (SDOH) Interventions    Readmission Risk Interventions No flowsheet data found.

## 2019-04-05 NOTE — Plan of Care (Signed)

## 2019-04-05 NOTE — Plan of Care (Signed)
  Problem: Education: Goal: Knowledge of General Education information will improve Description: Including pain rating scale, medication(s)/side effects and non-pharmacologic comfort measures Outcome: Progressing   Problem: Health Behavior/Discharge Planning: Goal: Ability to manage health-related needs will improve Outcome: Progressing   Problem: Clinical Measurements: Goal: Will remain free from infection Outcome: Progressing Goal: Diagnostic test results will improve Outcome: Progressing   Problem: Activity: Goal: Risk for activity intolerance will decrease Outcome: Progressing   Problem: Nutrition: Goal: Adequate nutrition will be maintained Outcome: Progressing   Problem: Coping: Goal: Level of anxiety will decrease Outcome: Progressing   Problem: Pain Managment: Goal: General experience of comfort will improve Outcome: Progressing

## 2019-04-05 NOTE — Progress Notes (Signed)
PROGRESS NOTE  Brooke Ellis NKN:[id number] DOB: 06-Aug-1950   PCP: Derinda Late, MD  Patient is from: Home  DOA: 03/28/2019 LOS: 8  Brief Narrative / Interim history: 69 year old female with history of prediabetes, IDA, GERD and Barrett's esophagus presented with cough, pleuritic chest pain and systemic symptoms and admitted for pneumonia and empyema of left lung.   She underwent VATS procedure by Dr. Servando Snare on 3/15 with 2 chest tube placements.  Chest tube removed on 3/17 and 3/18.  Subjective: Seen and examined this morning.  No major events overnight or this morning.  Left-sided chest pain resolved after chest tube removal.  No complaints at this time.  Husband at bedside.  Objective: Vitals:   04/05/19 0300 04/05/19 0316 04/05/19 0406 04/05/19 0653  BP:   122/75   Pulse: 81  88 89  Resp: '15 18 19 18  '$ Temp:   98.2 F (36.8 C)   TempSrc:   Oral   SpO2: 96% 96% 95% 94%  Weight:      Height:        Intake/Output Summary (Last 24 hours) at 04/05/2019 1100 Last data filed at 04/05/2019 0600 Gross per 24 hour  Intake 440 ml  Output 10 ml  Net 430 ml   Filed Weights   03/28/19 1155 03/28/19 1702 04/01/19 2009  Weight: 66 kg 63.5 kg 68.4 kg    Examination:  GENERAL: No acute distress.  Appears well.  HEENT: MMM.  Vision and hearing grossly intact.  NECK: Supple.  No apparent JVD.  RESP: 95% on 2 L by [place].  No IWOB.  Fair aeration bilaterally. CVS:  RRR. Heart sounds normal.  ABD/GI/GU: Bowel sounds present. Soft. Non tender.  MSK/EXT:  Moves extremities. No apparent deformity. No edema.  SKIN: no apparent skin lesion or wound NEURO: Awake, alert and oriented appropriately.  No apparent focal neuro deficit. PSYCH: Calm. Normal affect.  Procedures:  3/15-VATS procedure with chest tube placement  Assessment & Plan: Acute respiratory failure with hypoxia due to pneumonia/empyema: Requiring 5 L by nasal cannula. -Encourage incentive spirometry -Wean oxygen as  able -Treat pneumonia/empyema as below -OOB/PT/OT  Pneumonia with empyema- -s/p VATS and chest tube placement by Dr. Servando Snare on 3/15 -Chest tube removed on 3/17 3/18. -Blood and pleural fluid cultures, fungal culture, AFB and MRSA PCR negative. -Zosyn 3/10-3/15, Unasyn 3/15>>.  -Add probiotics  Leukocytosis/bandemia: Likely due to the above.  Improving. -Continue trending  Diet controlled DM-2: A1c 6.6%. Recent Labs    04/04/19 1621 04/04/19 2109 04/05/19 0649  GLUCAP 83 115* 84  -Continue SSI -Continue Zetia and Crestor  Diet esophagus -Continue PPI  Hypokalemia: Resolved.  Mood disorder: Stable -Continue home Prozac and Wellbutrin  Normocytic anemia: H&H stable.  Anemia panel with mild iron deficiency -Continue monitoring -P.o. ferrous sulfate               DVT prophylaxis: Subcu Lovenox Code Status: Full code Family Communication: Patient and/or RN. Available if any question.  Discharge barrier: Respiratory failure with hypoxia, and chest tube for empyema Patient is from: Home Final disposition: Likely home once medically stable and cleared by CTS.  Consultants: CTS   Microbiology summarized: COVID-19 negative Blood cultures negative Pleural fluid cultures negative MRSA PCR negative  Sch Meds:  Scheduled Meds: . acetaminophen  1,000 mg Oral Q6H   Or  . acetaminophen (TYLENOL) oral liquid 160 mg/5 mL  1,000 mg Oral Q6H  . bisacodyl  10 mg Oral Daily  . buPROPion  300 mg  Oral Daily  . Chlorhexidine Gluconate Cloth  6 each Topical Daily  . enoxaparin (LOVENOX) injection  30 mg Subcutaneous Q24H  . ezetimibe  10 mg Oral QPM  . fentaNYL   Intravenous Q4H  . FLUoxetine  10 mg Oral Daily  . guaiFENesin  600 mg Oral BID  . influenza vaccine adjuvanted  0.5 mL Intramuscular Tomorrow-1000  . insulin aspart  0-15 Units Subcutaneous TID WC  . pantoprazole  40 mg Oral BID  . rosuvastatin  40 mg Oral q1800  . senna-docusate  1 tablet Oral QHS    Continuous Infusions: . ampicillin-sulbactam (UNASYN) IV 3 g (04/05/19 0852)   PRN Meds:.diphenhydrAMINE **OR** diphenhydrAMINE, naloxone **AND** sodium chloride flush, ondansetron (ZOFRAN) IV, oxyCODONE, traMADol  Antimicrobials: Anti-infectives (From admission, onward)   Start     Dose/Rate Route Frequency Ordered Stop   04/03/19 0800  vancomycin (VANCOREADY) IVPB 750 mg/150 mL  Status:  Discontinued     750 mg 150 mL/hr over 60 Minutes Intravenous Every 12 hours 04/02/19 1748 04/03/19 0744   04/02/19 2000  Ampicillin-Sulbactam (UNASYN) 3 g in sodium chloride 0.9 % 100 mL IVPB     3 g 200 mL/hr over 30 Minutes Intravenous Every 6 hours 04/02/19 1848     04/02/19 1315  piperacillin-tazobactam (ZOSYN) IVPB 3.375 g     3.375 g 100 mL/hr over 30 Minutes Intravenous To Surgery 04/02/19 1305 04/02/19 1355   03/29/19 1200  azithromycin (ZITHROMAX) 500 mg in sodium chloride 0.9 % 250 mL IVPB  Status:  Discontinued     500 mg 250 mL/hr over 60 Minutes Intravenous Every 24 hours 03/28/19 1743 04/02/19 1630   03/28/19 2200  piperacillin-tazobactam (ZOSYN) IVPB 3.375 g  Status:  Discontinued     3.375 g 12.5 mL/hr over 240 Minutes Intravenous Every 8 hours 03/28/19 2101 04/02/19 1630   03/28/19 2000  piperacillin-tazobactam (ZOSYN) IVPB 3.375 g  Status:  Discontinued     3.375 g 12.5 mL/hr over 240 Minutes Intravenous Every 8 hours 03/28/19 1853 03/28/19 2101   03/28/19 1800  piperacillin-tazobactam (ZOSYN) IVPB 3.375 g  Status:  Discontinued     3.375 g 12.5 mL/hr over 240 Minutes Intravenous Every 8 hours 03/28/19 1743 03/28/19 1853   03/28/19 1800  vancomycin (VANCOREADY) IVPB 1250 mg/250 mL     1,250 mg 166.7 mL/hr over 90 Minutes Intravenous  Once 03/28/19 1747 03/28/19 2018   03/28/19 1800  vancomycin (VANCOCIN) IVPB 1000 mg/200 mL premix  Status:  Discontinued     1,000 mg 200 mL/hr over 60 Minutes Intravenous Every 24 hours 03/28/19 1752 03/28/19 1853   03/28/19 1800  vancomycin  (VANCOCIN) IVPB 1000 mg/200 mL premix  Status:  Discontinued     1,000 mg 200 mL/hr over 60 Minutes Intravenous Every 24 hours 03/28/19 1853 04/02/19 1630   03/28/19 1415  clindamycin (CLEOCIN) IVPB 600 mg     600 mg 100 mL/hr over 30 Minutes Intravenous  Once 03/28/19 1405 03/28/19 1504   03/28/19 1300  cefTRIAXone (ROCEPHIN) 1 g in sodium chloride 0.9 % 100 mL IVPB     1 g 200 mL/hr over 30 Minutes Intravenous  Once 03/28/19 1256 03/28/19 1437   03/28/19 1300  azithromycin (ZITHROMAX) 500 mg in sodium chloride 0.9 % 250 mL IVPB     500 mg 250 mL/hr over 60 Minutes Intravenous  Once 03/28/19 1256 03/28/19 1519       I have personally reviewed the following labs and images: CBC: Recent Labs  Lab 04/01/19  6789 04/02/19 0306 04/03/19 0245 04/04/19 0219 04/05/19 0225  WBC 12.4* 11.8* 21.4* 13.9* 11.2*  HGB 10.1* 9.8* 10.1* 10.0* 9.7*  HCT 32.1* 31.0* 32.0* 31.9* 30.4*  MCV 83.8 83.8 83.6 83.7 83.3  PLT 583* 810* 700* 674* 685*   BMP &GFR Recent Labs  Lab 03/30/19 1147 03/30/19 1147 03/31/19 0546 03/31/19 0546 04/01/19 0521 04/02/19 0306 04/03/19 0245 04/04/19 0219 04/05/19 0225  NA 134*   < > 137   < > 141 140 138 142 140  K 2.8*   < > 3.6   < > 3.5 3.8 3.4* 3.2* 3.8  CL 95*   < > 99   < > 102 104 99 99 100  CO2 26   < > 25   < > '28 24 29 30 29  '$ GLUCOSE 100*   < > 86   < > 122* 98 157* 97 98  BUN 9   < > 7*   < > 6* <5* <5* 5* 5*  CREATININE 0.64   < > 0.59   < > 0.47 0.72 0.63 0.68 0.60  CALCIUM 8.1*   < > 8.6*   < > 8.4* 8.3* 8.4* 8.5* 8.1*  MG 2.0  --  1.9  --   --   --   --  1.7 1.8  PHOS  --   --  2.6  --   --   --   --   --  3.8   < > = values in this interval not displayed.   Estimated Creatinine Clearance: 58.1 mL/min (by C-G formula based on SCr of 0.6 mg/dL). Liver & Pancreas: Recent Labs  Lab 03/31/19 0546 04/02/19 0306 04/04/19 0219 04/05/19 0225  AST 129* 82* 38  --   ALT 64* 62* 38  --   ALKPHOS 137* 146* 136*  --   BILITOT 0.9 0.4 0.4  --    PROT 6.0* 5.7* 5.7*  --   ALBUMIN 2.1* 1.7* 1.6* 1.6*   No results for input(s): LIPASE, AMYLASE in the last 168 hours. No results for input(s): AMMONIA in the last 168 hours. Diabetic: Recent Labs    04/04/19 0219  HGBA1C 6.6*   Recent Labs  Lab 04/04/19 0614 04/04/19 1049 04/04/19 1621 04/04/19 2109 04/05/19 0649  GLUCAP 83 80 83 115* 84   Cardiac Enzymes: No results for input(s): CKTOTAL, CKMB, CKMBINDEX, TROPONINI in the last 168 hours. No results for input(s): PROBNP in the last 8760 hours. Coagulation Profile: Recent Labs  Lab 03/29/19 1029 04/02/19 0306  INR 1.3* 1.2   Thyroid Function Tests: No results for input(s): TSH, T4TOTAL, FREET4, T3FREE, THYROIDAB in the last 72 hours. Lipid Profile: No results for input(s): CHOL, HDL, LDLCALC, TRIG, CHOLHDL, LDLDIRECT in the last 72 hours. Anemia Panel: Recent Labs    04/05/19 0801  VITAMINB12 366  FOLATE 17.2  FERRITIN 104  TIBC 211*  IRON 23*  RETICCTPCT 2.2   Urine analysis:    Component Value Date/Time   COLORURINE YELLOW 04/02/2019 0700   APPEARANCEUR CLEAR 04/02/2019 0700   LABSPEC 1.019 04/02/2019 0700   PHURINE 6.0 04/02/2019 0700   GLUCOSEU NEGATIVE 04/02/2019 0700   HGBUR NEGATIVE 04/02/2019 0700   BILIRUBINUR NEGATIVE 04/02/2019 0700   KETONESUR 5 (A) 04/02/2019 0700   PROTEINUR 30 (A) 04/02/2019 0700   NITRITE NEGATIVE 04/02/2019 0700   LEUKOCYTESUR TRACE (A) 04/02/2019 0700   Sepsis Labs: Invalid input(s): PROCALCITONIN, [place]  Microbiology: Recent Results (from the past 240 hour(s))  Blood  Culture (routine x 2)     Status: None   Collection Time: 03/28/19 12:06 PM   Specimen: BLOOD  Result Value Ref Range Status   Specimen Description BLOOD RIGHT ANTECUBITAL  Final   Special Requests   Final    BOTTLES DRAWN AEROBIC AND ANAEROBIC Blood Culture adequate volume Performed at [facility], [address] [place], [place] [address]    Culture NO GROWTH 5 DAYS   Final   Report Status 04/02/2019 FINAL  Final  Blood Culture (routine x 2)     Status: None   Collection Time: 03/28/19 12:06 PM   Specimen: BLOOD  Result Value Ref Range Status   Specimen Description BLOOD LEFT ANTECUBITAL  Final   Special Requests   Final    BOTTLES DRAWN AEROBIC AND ANAEROBIC Blood Culture adequate volume Performed at [facility], [address] [place], [place] [address]    Culture NO GROWTH 5 DAYS  Final   Report Status 04/02/2019 FINAL  Final  SARS Coronavirus 2 Ag (30 min TAT) - Nasal Swab (BD Veritor Kit)     Status: None   Collection Time: 03/28/19 12:06 PM   Specimen: Nasal Swab (BD Veritor Kit)  Result Value Ref Range Status   SARS Coronavirus 2 Ag NEGATIVE NEGATIVE Final    Comment: (NOTE) SARS-CoV-2 antigen NOT DETECTED.  Negative results are presumptive.  Negative results do not preclude SARS-CoV-2 infection and should not be used as the sole basis for treatment or other patient management decisions, including infection  control decisions, particularly in the presence of clinical signs and  symptoms consistent with COVID-19, or in those who have been in contact with the virus.  Negative results must be combined with clinical observations, patient history, and epidemiological information. The expected result is Negative. Fact Sheet for Patients: PodPark.tn Fact Sheet for Healthcare Providers: GiftContent.is This test is not yet approved or cleared by the [place] FDA and  has been authorized for detection and/or diagnosis of SARS-CoV-2 by FDA under an Emergency Use Authorization (EUA).  This EUA will remain in effect (meaning this test can be used) for the duration of  the COVID-19 de claration under Section 564(b)(1) of the Act, 21 U.S.C. section 360bbb-3(b)(1), unless the authorization is terminated or revoked sooner. Performed at [facility], [address] [place], [place] [address]   Respiratory Panel by RT PCR (Flu A&B, Covid) - Nasopharyngeal Swab     Status: None   Collection Time: 03/28/19  1:17 PM   Specimen: Nasopharyngeal Swab  Result Value Ref Range Status   SARS Coronavirus 2 by RT PCR NEGATIVE NEGATIVE Final    Comment: (NOTE) SARS-CoV-2 target nucleic acids are NOT DETECTED. The SARS-CoV-2 RNA is generally detectable in upper respiratoy specimens during the acute phase of infection. The lowest concentration of SARS-CoV-2 viral copies this assay can detect is 131 copies/mL. A negative result does not preclude SARS-Cov-2 infection and should not be used as the sole basis for treatment or other patient management decisions. A negative result may occur with  improper specimen collection/handling, submission of specimen other than nasopharyngeal swab, presence of viral mutation(s) within the areas targeted by this assay, and inadequate number of viral copies (<131 copies/mL). A negative result must be combined with clinical observations, patient history, and epidemiological information. The expected result is Negative. Fact Sheet for Patients:  PinkCheek.be Fact Sheet for Healthcare Providers:  GravelBags.it This test is not yet ap  proved or cleared by the [place] and  has been authorized for detection and/or diagnosis of SARS-CoV-2 by FDA under an Emergency Use Authorization (EUA). This EUA will remain  in effect (meaning this test can be used) for the duration of the COVID-19 declaration under Section 564(b)(1) of the Act, 21 U.S.C. section 360bbb-3(b)(1), unless the authorization is terminated or revoked sooner.    Influenza A by PCR NEGATIVE NEGATIVE Final   Influenza B by PCR NEGATIVE NEGATIVE Final    Comment: (NOTE) The Xpert Xpress SARS-CoV-2/FLU/RSV assay is intended as an aid in  the diagnosis of influenza from Nasopharyngeal swab specimens and    should not be used as a sole basis for treatment. Nasal washings and  aspirates are unacceptable for Xpert Xpress SARS-CoV-2/FLU/RSV  testing. Fact Sheet for Patients: PinkCheek.be Fact Sheet for Healthcare Providers: GravelBags.it This test is not yet approved or cleared by the [place] FDA and  has been authorized for detection and/or diagnosis of SARS-CoV-2 by  FDA under an Emergency Use Authorization (EUA). This EUA will remain  in effect (meaning this test can be used) for the duration of the  Covid-19 declaration under Section 564(b)(1) of the Act, 21  U.S.C. section 360bbb-3(b)(1), unless the authorization is  terminated or revoked. Performed at [facility], [address] [place], [place] [address]   Fungus Culture With Stain     Status: None (Preliminary result)   Collection Time: 03/29/19  3:40 PM   Specimen: PATH Cytology Pleural fluid  Result Value Ref Range Status   Fungus Stain Final report  Final    Comment: (NOTE) Performed At: [facility] [place], [place] [id number] Rush Farmer MD [id number]    Fungus (Mycology) Culture PENDING  Incomplete   Fungal Source PLEURAL  Final    Comment: Performed at [facility], [place] [address], [place], [place] [address]  Culture, body fluid-bottle     Status: None   Collection Time: 03/29/19  3:40 PM   Specimen: Pleura  Result Value Ref Range Status   Specimen Description PLEURAL  Final   Special Requests NONE  Final   Culture   Final    NO GROWTH 5 DAYS Performed at [facility], [address] [place], [place] [address]    Report Status 04/03/2019 FINAL  Final  Gram stain     Status: None   Collection Time: 03/29/19  3:40 PM   Specimen: Pleura  Result Value Ref Range Status   Specimen Description PLEURAL  Final   Special Requests NONE  Final   Gram Stain   Final    MODERATE WBC PRESENT,  PREDOMINANTLY PMN NO ORGANISMS SEEN Performed at [facility], [place] [address], [place], [place] [address]    Report Status 03/29/2019 FINAL  Final  Fungus Culture Result     Status: None   Collection Time: 03/29/19  3:40 PM  Result Value Ref Range Status   Result 1 Comment  Final    Comment: (NOTE) KOH/Calcofluor preparation:  no fungus observed. Performed At: [facility] [place], [place] [id number] Rush Farmer MD [id number]   Body fluid culture     Status: None (Preliminary result)   Collection Time: 04/02/19  1:02 PM   Specimen: Pleural, Left; Body Fluid  Result Value Ref Range Status   Specimen Description PLEURAL LEFT  Final   Special Requests NONE  Final   Gram Stain  Final    FEW WBC PRESENT, PREDOMINANTLY PMN RARE GRAM POSITIVE COCCI    Culture   Final    CULTURE REINCUBATED FOR BETTER GROWTH Performed at [facility], [place] [address], [place], [place] [address]    Report Status PENDING  Incomplete  Fungus Culture With Stain     Status: None (Preliminary result)   Collection Time: 04/02/19  1:02 PM   Specimen: Pleural, Left; Body Fluid  Result Value Ref Range Status   Fungus Stain Final report  Final    Comment: (NOTE) Performed At: [facility] [place], [place] [id number] Rush Farmer MD [id number]    Fungus (Mycology) Culture PENDING  Incomplete   Fungal Source PLEURAL  Final    Comment: LEFT Performed at [facility], [place] [address], [place], [place] [address]   Acid Fast Smear (AFB)     Status: None   Collection Time: 04/02/19  1:02 PM   Specimen: Pleural, Left; Body Fluid  Result Value Ref Range Status   AFB Specimen Processing Concentration  Final   Acid Fast Smear Negative  Final    Comment: (NOTE) Performed At: [facility] [place], [place] [id number] Rush Farmer MD [id number]    Source (AFB) PLEURAL  Final    Comment: LEFT Performed at  [facility], [place] [address] [place], [place] [address]   Fungus Culture Result     Status: None   Collection Time: 04/02/19  1:02 PM  Result Value Ref Range Status   Result 1 Comment  Final    Comment: (NOTE) KOH/Calcofluor preparation:  no fungus observed. Performed At: [facility] [place], [place] [id number] Rush Farmer MD [id number]   Culture, respiratory     Status: None   Collection Time: 04/02/19  1:04 PM   Specimen: Bronchial Washing, Left; Respiratory  Result Value Ref Range Status   Specimen Description BRONCHIAL ALVEOLAR LAVAGE LEFT  Final   Special Requests PT ON ZITHROMAX ZOSYN VANC  Final   Gram Stain   Final    FEW WBC PRESENT, PREDOMINANTLY PMN NO ORGANISMS SEEN    Culture   Final    NO GROWTH 2 DAYS Performed at [facility], [place] [address], [place], [place] [address]    Report Status 04/04/2019 FINAL  Final  Acid Fast Smear (AFB)     Status: None   Collection Time: 04/02/19  1:04 PM   Specimen: Bronchial Washing, Left; Respiratory  Result Value Ref Range Status   AFB Specimen Processing Concentration  Final   Acid Fast Smear Negative  Final    Comment: (NOTE) Performed At: [facility] [place], [place] [id number] Rush Farmer MD [id number]    Source (AFB) BRONCHIAL ALVEOLAR LAVAGE  Final    Comment: LEFT Performed at [facility], [place] [address] [place], [place] [address]   Fungus Culture With Stain     Status: None (Preliminary result)   Collection Time: 04/02/19  1:38 PM   Specimen: Pleural, Left; Tissue  Result Value Ref Range Status   Fungus Stain Final report  Final    Comment: (NOTE) Performed At: [place], [place] [id number] Rush Farmer MD [id number]    Fungus (Mycology) Culture PENDING  Incomplete   Fungal Source TISSUE  Final    Comment: PLEURAL LEFT Performed at [facility], [place] [address], [place],  [place] [address]  Aerobic/Anaerobic Culture (surgical/deep wound)     Status: None (Preliminary result)   Collection Time: 04/02/19  1:38 PM   Specimen: Pleural, Left; Tissue  Result Value Ref Range Status   Specimen Description TISSUE PLEURAL LEFT  Final   Special Requests SPEC C  Final   Gram Stain   Final    RARE WBC PRESENT, PREDOMINANTLY PMN NO ORGANISMS SEEN    Culture   Final    NO GROWTH 3 DAYS NO ANAEROBES ISOLATED; CULTURE IN PROGRESS FOR 5 DAYS Performed at [facility], [place] [address], [place], [place] [address]    Report Status PENDING  Incomplete  Acid Fast Smear (AFB)     Status: None   Collection Time: 04/02/19  1:38 PM   Specimen: Pleural, Left; Tissue  Result Value Ref Range Status   AFB Specimen Processing Comment  Final    Comment: Tissue Grinding and Digestion/Decontamination   Acid Fast Smear Negative  Final    Comment: (NOTE) Performed At: [facility] [place], [place] [id number] Rush Farmer MD [id number]    Source (AFB) TISSUE  Final    Comment: PLEURAL LEFT Performed at [facility], [place] [address] [place], [place] [address]   Fungus Culture Result     Status: None   Collection Time: 04/02/19  1:38 PM  Result Value Ref Range Status   Result 1 Comment  Final    Comment: (NOTE) KOH/Calcofluor preparation:  no fungus observed. Performed At: [facility] [place], [place] [id number] Rush Farmer MD [id number]   MRSA PCR Screening     Status: None   Collection Time: 04/02/19  6:20 PM   Specimen: Nasal Mucosa; Nasopharyngeal  Result Value Ref Range Status   MRSA by PCR NEGATIVE NEGATIVE Final    Comment:        The GeneXpert MRSA Assay (FDA approved for NASAL specimens only), is one component of a comprehensive MRSA colonization surveillance program. It is not intended to diagnose MRSA infection nor to guide or monitor treatment for MRSA infections. Performed at [facility], [place] [address] [place], [place] [address]     Radiology Studies: DG CHEST PORT 1 VIEW  Result Date: 04/05/2019 CLINICAL DATA:  Pleural effusion EXAM: PORTABLE CHEST 1 VIEW COMPARISON:  04/04/2019 FINDINGS: Left chest tube in place with continued moderate to large left effusion. No pneumothorax. Bibasilar airspace opacities, likely atelectasis, unchanged. Heart is borderline in size. No acute bony abnormality. IMPRESSION: Left chest tube with stable moderate to large left effusion. No pneumothorax. Bibasilar atelectasis, unchanged. Electronically Signed   By: Rolm Baptise M.D.   On: 04/05/2019 08:29     Nicholis Stepanek T. [place]  If 7PM-7AM, please contact night-coverage [email or web address] Password [email or web address] 04/05/2019, 11:00 AM

## 2019-04-05 NOTE — Progress Notes (Signed)
PT Cancellation Note  Patient Details Name: Brooke Ellis MRN: [id number] DOB: Jun 10, 1950   Cancelled Treatment:    Reason Eval/Treat Not Completed: Medical issues which prohibited therapy. Pt's chest tube removed this AM. She then ambulated in hallway with her husband. Upon return to room, pt with N&V.  RN present in room upon PT arrival to administer nausea meds. PT to re-attempt as schedule allows.   Lorriane Shire 04/05/2019, 11:16 AM  Lorrin Goodell, PT  Office # [phone] Pager [phone]

## 2019-04-05 NOTE — Care Management Important Message (Signed)
Important Message  Patient Details  Name: Latona Krichbaum MRN: [id number] Date of Birth: 14-Mar-1950   Medicare Important Message Given:  Yes     Shelda Altes 04/05/2019, 11:58 AM

## 2019-04-05 NOTE — Progress Notes (Addendum)
      [address]       [place] [address]             [phone]       3 Days Post-Op Procedure(s) (LRB): VIDEO BRONCHOSCOPY (N/A) VIDEO ASSISTED THORACOSCOPY (VATS)/DECORTICATION, Drainage of Emypema. (Left)  Subjective: Patient had a bowel movement. She hopes remaining chest tube gets removed today as has a fair amount of pain from it.  Objective: Vital signs in last 24 hours: Temp:  [98.2 F (36.8 C)-98.4 F (36.9 C)] 98.2 F (36.8 C) (03/18 0406) Pulse Rate:  [81-97] 89 (03/18 0653) Cardiac Rhythm: Normal sinus rhythm (03/18 0300) Resp:  [15-25] 18 (03/18 0653) BP: (107-131)/(64-75) 122/75 (03/18 0406) SpO2:  [94 %-96 %] 94 % (03/18 0653)     Intake/Output from previous day: 03/17 0701 - 03/18 0700 In: 440 [P.O.:240; IV Piggyback:200] Out: 10 [Chest Tube:10]   Physical Exam:  Cardiovascular: RRR Pulmonary: Clear to auscultation on the right and diminished left base Abdomen: Soft, non tender, bowel sounds present. Extremities: No lower extremity edema. Wounds: Dressing around chest tube wound has some dried bloody drainage.   Chest Tube: to suction, no air leak  Lab Results: CBC: Recent Labs    04/04/19 0219 04/05/19 0225  WBC 13.9* 11.2*  HGB 10.0* 9.7*  HCT 31.9* 30.4*  PLT 674* 685*   BMET:  Recent Labs    04/04/19 0219 04/05/19 0225  NA 142 140  K 3.2* 3.8  CL 99 100  CO2 30 29  GLUCOSE 97 98  BUN 5* 5*  CREATININE 0.68 0.60  CALCIUM 8.5* 8.1*    PT/INR:  No results for input(s): LABPROT, INR in the last 72 hours. ABG:  INR: Will add last result for INR, ABG once components are confirmed Will add last 4 CBG results once components are confirmed  Assessment/Plan:  1. CV - SR with HR in the 90's this am.  2.  Pulmonary -  On 2 liters of oxygen via [place]. Wean as able over the next few days. Chest tube with 10 cc of output last 24 hours. Chest tube is to suction and there is no air leak. CXR this am appears to show  persistent left pleural effusion, left base atelectasis. Hope to remove remaining chest tube. Continue Mucinex. Encourage incentive spirometer. 3. ID-WBC decreased to 11,200. On Vancomycin and Unasyn. Left pleural fluid gram stain shows no organisms culture no growth thus far. 4. Mild hypokalemia-supplement potassium. Mg 1.8 5. Expected ABL anemia-H and H this am slightly decreased to 9.7 and 30.4. Patient does not need any more labs drawn 7. CBGs 83/115/84. HGA1C 6.6. On Metformin prior to admission, which patient states is for weight loss not diabetes.  Likely restart at discharge;per primary   Sharalyn Ink [facility] 04/05/2019,7:03 AM [phone]  D/c remaining chest tube , very little drainage  Continue antibiotics Pa /lat chest xray in am I have seen and examined Brooke Ellis and agree with the above assessment  and plan.  [name] MD Beeper [phone] Office [phone] 04/05/2019 7:57 AM

## 2019-04-06 ENCOUNTER — Inpatient Hospital Stay (HOSPITAL_COMMUNITY): Payer: Medicare Other

## 2019-04-06 DIAGNOSIS — S22080D Wedge compression fracture of T11-T12 vertebra, subsequent encounter for fracture with routine healing: Secondary | ICD-10-CM

## 2019-04-06 LAB — GLUCOSE, CAPILLARY
Glucose-Capillary: 90 mg/dL (ref 70–99)
Glucose-Capillary: 112 mg/dL — ABNORMAL HIGH (ref 70–99)
Glucose-Capillary: 79 mg/dL (ref 70–99)
Glucose-Capillary: 110 mg/dL — ABNORMAL HIGH (ref 70–99)

## 2019-04-06 LAB — BODY FLUID CULTURE

## 2019-04-06 MED ORDER — CALCIUM CARBONATE-VITAMIN D 500-200 MG-UNIT PO TABS
2.0000 | ORAL_TABLET | Freq: Every day | ORAL | Status: DC
  Administered 2019-04-07: 06:00:00 2 via ORAL
  Filled 2019-04-06: qty 2

## 2019-04-06 MED ORDER — AMOXICILLIN-POT CLAVULANATE 875-125 MG PO TABS
1.0000 | ORAL_TABLET | Freq: Two times a day (BID) | ORAL | Status: DC
  Administered 2019-04-06 – 2019-04-07 (×3): 1 via ORAL
  Filled 2019-04-06 (×4): qty 1

## 2019-04-06 NOTE — Progress Notes (Addendum)
      [address]       [place] [address]             [phone]       4 Days Post-Op Procedure(s) (LRB): VIDEO BRONCHOSCOPY (N/A) VIDEO ASSISTED THORACOSCOPY (VATS)/DECORTICATION, Drainage of Emypema. (Left)  Subjective: Patient states she is feeling better now that chest tubes are out.  Objective: Vital signs in last 24 hours: Temp:  [98.3 F (36.8 C)-98.7 F (37.1 C)] 98.7 F (37.1 C) (03/19 0733) Pulse Rate:  [80-95] 90 (03/19 0733) Cardiac Rhythm: Normal sinus rhythm (03/19 0723) Resp:  [14-19] 16 (03/19 0733) BP: (110-123)/(61-71) 117/61 (03/19 0733) SpO2:  [93 %-97 %] 93 % (03/19 0733)     Intake/Output from previous day: 03/18 0701 - 03/19 0700 In: 560 [P.O.:360; IV Piggyback:200] Out: -    Physical Exam:  Cardiovascular: RRR Pulmonary: Clear to auscultation on the right and diminished left base Abdomen: Soft, non tender, bowel sounds present. Extremities: No lower extremity edema. Wounds: Clean and dry. Anterior chest tube wound has minor dried bloody ooze   Lab Results: CBC: Recent Labs    04/04/19 0219 04/05/19 0225  WBC 13.9* 11.2*  HGB 10.0* 9.7*  HCT 31.9* 30.4*  PLT 674* 685*   BMET:  Recent Labs    04/04/19 0219 04/05/19 0225  NA 142 140  K 3.2* 3.8  CL 99 100  CO2 30 29  GLUCOSE 97 98  BUN 5* 5*  CREATININE 0.68 0.60  CALCIUM 8.5* 8.1*    PT/INR:  No results for input(s): LABPROT, INR in the last 72 hours. ABG:  INR: Will add last result for INR, ABG once components are confirmed Will add last 4 CBG results once components are confirmed  Assessment/Plan:  1. CV - SR with HR in the 90's this am.  2.  Pulmonary -  On 1 liter of oxygen via [place]. Wean as able over the next few days. Chest tube with 10 cc of output last 24 hours. Chest tube is to suction and there is no air leak. CXR this am appears to show persistent left pleural effusion, left base atelectasis.Continue Mucinex. Encourage incentive  spirometer. 3. ID-Last WBC decreased to 11,200. On Vancomycin and Unasyn. Left pleural fluid gram stain shows no organisms culture no growth thus far. 5. Expected ABL anemia-Last and H 9.7 and 30.4.  7. CBGs 80/122/90. HGA1C 6.6. On Metformin prior to admission, which patient states is for weight loss not diabetes.  Likely restart at discharge;per primary   Sharalyn Ink [facility] 04/06/2019,7:35 AM [phone]  Feels better, still left lower lobe consolidation Still on o2- try to wean off and check sats Convert to po antibiotics Poss home Sat or Sunday Patient does not want to go to snf Follow up one week chest xray  I have seen and examined Brooke Ellis and agree with the above assessment  and plan.  [name] MD Beeper [phone] Office [phone] 04/06/2019 7:59 AM

## 2019-04-06 NOTE — Progress Notes (Signed)
PROGRESS NOTE  Brooke Ellis ZYS:[id number] DOB: [birth year]   PCP: Derinda Late, MD  Patient is from: Home  DOA: 03/28/2019 LOS: 9  Brief Narrative / Interim history: 69 year old female with history of prediabetes, IDA, GERD and Barrett's esophagus presented with cough, pleuritic chest pain and systemic symptoms and admitted for pneumonia and empyema of left lung.   She underwent VATS procedure by Dr. Servando Snare on 3/15 with 2 chest tube placements.  Chest tube removed on 3/17 and 3/18.  Oxygen requirement improved.  Antibiotic descalated to Augmentind  Subjective: Seen and examined earlier this morning.  No major events overnight of this morning.  No complaints other than "a little bit of pain" where the chest tube came out.  Saturating in mid 90s on 1 L by nasal cannula.  Objective: Vitals:   04/06/19 0312 04/06/19 0350 04/06/19 0733 04/06/19 1052  BP: 123/71  117/61 106/71  Pulse:   90 84  Resp: '17 18 16 17  '$ Temp: 98.6 F (37 C)  98.7 F (37.1 C) 98.4 F (36.9 C)  TempSrc: Oral  Oral Oral  SpO2: 94% 96% 93% 94%  Weight:      Height:        Intake/Output Summary (Last 24 hours) at 04/06/2019 1439 Last data filed at 04/06/2019 1200 Gross per 24 hour  Intake 400 ml  Output -  Net 400 ml   Filed Weights   03/28/19 1155 03/28/19 1702 04/01/19 2009  Weight: 66 kg 63.5 kg 68.4 kg    Examination:  GENERAL: No acute distress.  Appears well.  HEENT: MMM.  Vision and hearing grossly intact.  NECK: Supple.  No apparent JVD.  RESP: 94% on 1 L by [place] [facility] x-rays.  No IWOB.  Fair aeration, diminished over left base. CVS:  RRR. Heart sounds normal.  ABD/GI/GU: Bowel sounds present. Soft. Non tender.  MSK/EXT:  Moves extremities. No apparent deformity. No edema.  SKIN: no apparent skin lesion or wound NEURO: Awake, alert and oriented appropriately.  No apparent focal neuro deficit. PSYCH: Calm. Normal affect  Procedures:  3/15-VATS procedure with chest tube placement   Assessment & Plan: Acute respiratory failure with hypoxia due to pneumonia/empyema: Improving.  -Encourage incentive spirometry -Wean oxygen as able -Treat pneumonia/empyema as below -OOB/PT/OT  Pneumonia with empyema-still with moderate to large left effusion and LLL consolidation on chest x-ray. -s/p VATS and chest tube placement by Dr. Servando Snare on 3/15 -Chest tube removed on 3/17 and 3/18. -Blood and pleural fluid cultures, fungal culture, AFB and MRSA PCR negative. -Zosyn 3/10-3/15, Unasyn 3/15>> 3/19.  Augmentin 3/19> -Continue probiotics.  Leukocytosis/bandemia: Likely due to the above.  Improving. -Continue trending  Diet controlled DM-2: A1c 6.6%. Recent Labs    04/05/19 2237 04/06/19 0603 04/06/19 1054  GLUCAP 122* 90 112*  -Continue SSI -Continue Zetia and Crestor  Diet esophagus -Continue PPI  Hypokalemia: Resolved.  Mood disorder: Stable -Continue home Prozac and Wellbutrin  Normocytic anemia: H&H stable.  Anemia panel with mild iron deficiency -P.o. ferrous sulfate  T11 compression fracture-reported as T12 compression fracture on chest x-ray but likely old T11 compression fracture noted on MRI about a year ago.  No back pain. -Calcium and vitamin D               DVT prophylaxis: Subcu Lovenox Code Status: Full code Family Communication: Updated patient's husband at bedside.  Discharge barrier: Respiratory failure with hypoxia.  Still on supplemental oxygen Patient is from: Home Final disposition: Likely home once medically stable  and cleared by CTS-likely over the weekend..  Consultants: CTS   Microbiology summarized: COVID-19 negative Blood cultures negative Pleural fluid cultures negative MRSA PCR negative  Sch Meds:  Scheduled Meds: . acetaminophen  1,000 mg Oral Q6H   Or  . acetaminophen (TYLENOL) oral liquid 160 mg/5 mL  1,000 mg Oral Q6H  . amoxicillin-clavulanate  1 tablet Oral Q12H  . bisacodyl  10 mg Oral Daily  .  buPROPion  300 mg Oral Daily  . Chlorhexidine Gluconate Cloth  6 each Topical Daily  . enoxaparin (LOVENOX) injection  30 mg Subcutaneous Q24H  . ezetimibe  10 mg Oral QPM  . ferrous sulfate  325 mg Oral BID WC  . FLUoxetine  10 mg Oral Daily  . guaiFENesin  600 mg Oral BID  . influenza vaccine adjuvanted  0.5 mL Intramuscular Tomorrow-1000  . insulin aspart  0-15 Units Subcutaneous TID WC  . pantoprazole  40 mg Oral BID  . rosuvastatin  40 mg Oral q1800  . saccharomyces boulardii  250 mg Oral BID  . senna-docusate  1 tablet Oral QHS   Continuous Infusions:  PRN Meds:.ondansetron (ZOFRAN) IV, oxyCODONE, traMADol  Antimicrobials: Anti-infectives (From admission, onward)   Start     Dose/Rate Route Frequency Ordered Stop   04/06/19 1445  amoxicillin-clavulanate (AUGMENTIN) 875-125 MG per tablet 1 tablet     1 tablet Oral Every 12 hours 04/06/19 1439     04/03/19 0800  vancomycin (VANCOREADY) IVPB 750 mg/150 mL  Status:  Discontinued     750 mg 150 mL/hr over 60 Minutes Intravenous Every 12 hours 04/02/19 1748 04/03/19 0744   04/02/19 2000  Ampicillin-Sulbactam (UNASYN) 3 g in sodium chloride 0.9 % 100 mL IVPB  Status:  Discontinued     3 g 200 mL/hr over 30 Minutes Intravenous Every 6 hours 04/02/19 1848 04/06/19 1439   04/02/19 1315  piperacillin-tazobactam (ZOSYN) IVPB 3.375 g     3.375 g 100 mL/hr over 30 Minutes Intravenous To Surgery 04/02/19 1305 04/02/19 1355   03/29/19 1200  azithromycin (ZITHROMAX) 500 mg in sodium chloride 0.9 % 250 mL IVPB  Status:  Discontinued     500 mg 250 mL/hr over 60 Minutes Intravenous Every 24 hours 03/28/19 1743 04/02/19 1630   03/28/19 2200  piperacillin-tazobactam (ZOSYN) IVPB 3.375 g  Status:  Discontinued     3.375 g 12.5 mL/hr over 240 Minutes Intravenous Every 8 hours 03/28/19 2101 04/02/19 1630   03/28/19 2000  piperacillin-tazobactam (ZOSYN) IVPB 3.375 g  Status:  Discontinued     3.375 g 12.5 mL/hr over 240 Minutes Intravenous Every  8 hours 03/28/19 1853 03/28/19 2101   03/28/19 1800  piperacillin-tazobactam (ZOSYN) IVPB 3.375 g  Status:  Discontinued     3.375 g 12.5 mL/hr over 240 Minutes Intravenous Every 8 hours 03/28/19 1743 03/28/19 1853   03/28/19 1800  vancomycin (VANCOREADY) IVPB 1250 mg/250 mL     1,250 mg 166.7 mL/hr over 90 Minutes Intravenous  Once 03/28/19 1747 03/28/19 2018   03/28/19 1800  vancomycin (VANCOCIN) IVPB 1000 mg/200 mL premix  Status:  Discontinued     1,000 mg 200 mL/hr over 60 Minutes Intravenous Every 24 hours 03/28/19 1752 03/28/19 1853   03/28/19 1800  vancomycin (VANCOCIN) IVPB 1000 mg/200 mL premix  Status:  Discontinued     1,000 mg 200 mL/hr over 60 Minutes Intravenous Every 24 hours 03/28/19 1853 04/02/19 1630   03/28/19 1415  clindamycin (CLEOCIN) IVPB 600 mg     600  mg 100 mL/hr over 30 Minutes Intravenous  Once 03/28/19 1405 03/28/19 1504   03/28/19 1300  cefTRIAXone (ROCEPHIN) 1 g in sodium chloride 0.9 % 100 mL IVPB     1 g 200 mL/hr over 30 Minutes Intravenous  Once 03/28/19 1256 03/28/19 1437   03/28/19 1300  azithromycin (ZITHROMAX) 500 mg in sodium chloride 0.9 % 250 mL IVPB     500 mg 250 mL/hr over 60 Minutes Intravenous  Once 03/28/19 1256 03/28/19 1519       I have personally reviewed the following labs and images: CBC: Recent Labs  Lab 04/01/19 0521 04/02/19 0306 04/03/19 0245 04/04/19 0219 04/05/19 0225  WBC 12.4* 11.8* 21.4* 13.9* 11.2*  HGB 10.1* 9.8* 10.1* 10.0* 9.7*  HCT 32.1* 31.0* 32.0* 31.9* 30.4*  MCV 83.8 83.8 83.6 83.7 83.3  PLT 583* 810* 700* 674* 685*   BMP &GFR Recent Labs  Lab 03/31/19 0546 03/31/19 0546 04/01/19 0521 04/02/19 0306 04/03/19 0245 04/04/19 0219 04/05/19 0225  NA 137   < > 141 140 138 142 140  K 3.6   < > 3.5 3.8 3.4* 3.2* 3.8  CL 99   < > 102 104 99 99 100  CO2 25   < > '28 24 29 30 29  '$ GLUCOSE 86   < > 122* 98 157* 97 98  BUN 7*   < > 6* <5* <5* 5* 5*  CREATININE 0.59   < > 0.47 0.72 0.63 0.68 0.60  CALCIUM  8.6*   < > 8.4* 8.3* 8.4* 8.5* 8.1*  MG 1.9  --   --   --   --  1.7 1.8  PHOS 2.6  --   --   --   --   --  3.8   < > = values in this interval not displayed.   Estimated Creatinine Clearance: 58.1 mL/min (by C-G formula based on SCr of 0.6 mg/dL). Liver & Pancreas: Recent Labs  Lab 03/31/19 0546 04/02/19 0306 04/04/19 0219 04/05/19 0225  AST 129* 82* 38  --   ALT 64* 62* 38  --   ALKPHOS 137* 146* 136*  --   BILITOT 0.9 0.4 0.4  --   PROT 6.0* 5.7* 5.7*  --   ALBUMIN 2.1* 1.7* 1.6* 1.6*   No results for input(s): LIPASE, AMYLASE in the last 168 hours. No results for input(s): AMMONIA in the last 168 hours. Diabetic: Recent Labs    04/04/19 0219  HGBA1C 6.6*   Recent Labs  Lab 04/05/19 1135 04/05/19 1625 04/05/19 2237 04/06/19 0603 04/06/19 1054  GLUCAP 80 80 122* 90 112*   Cardiac Enzymes: No results for input(s): CKTOTAL, CKMB, CKMBINDEX, TROPONINI in the last 168 hours. No results for input(s): PROBNP in the last 8760 hours. Coagulation Profile: Recent Labs  Lab 04/02/19 0306  INR 1.2   Thyroid Function Tests: No results for input(s): TSH, T4TOTAL, FREET4, T3FREE, THYROIDAB in the last 72 hours. Lipid Profile: No results for input(s): CHOL, HDL, LDLCALC, TRIG, CHOLHDL, LDLDIRECT in the last 72 hours. Anemia Panel: Recent Labs    04/05/19 0801  VITAMINB12 366  FOLATE 17.2  FERRITIN 104  TIBC 211*  IRON 23*  RETICCTPCT 2.2   Urine analysis:    Component Value Date/Time   COLORURINE YELLOW 04/02/2019 0700   APPEARANCEUR CLEAR 04/02/2019 0700   LABSPEC 1.019 04/02/2019 0700   PHURINE 6.0 04/02/2019 0700   GLUCOSEU NEGATIVE 04/02/2019 0700   HGBUR NEGATIVE 04/02/2019 0700   BILIRUBINUR NEGATIVE 04/02/2019  0700   KETONESUR 5 (A) 04/02/2019 0700   PROTEINUR 30 (A) 04/02/2019 0700   NITRITE NEGATIVE 04/02/2019 0700   LEUKOCYTESUR TRACE (A) 04/02/2019 0700   Sepsis Labs: Invalid input(s): PROCALCITONIN, [place]  Microbiology: Recent Results  (from the past 240 hour(s))  Blood Culture (routine x 2)     Status: None   Collection Time: 03/28/19 12:06 PM   Specimen: BLOOD  Result Value Ref Range Status   Specimen Description BLOOD RIGHT ANTECUBITAL  Final   Special Requests   Final    BOTTLES DRAWN AEROBIC AND ANAEROBIC Blood Culture adequate volume Performed at [facility], [address] [place], [place] [address]    Culture NO GROWTH 5 DAYS  Final   Report Status 04/02/2019 FINAL  Final  Blood Culture (routine x 2)     Status: None   Collection Time: 03/28/19 12:06 PM   Specimen: BLOOD  Result Value Ref Range Status   Specimen Description BLOOD LEFT ANTECUBITAL  Final   Special Requests   Final    BOTTLES DRAWN AEROBIC AND ANAEROBIC Blood Culture adequate volume Performed at [facility], [address] [place], [place] [address]    Culture NO GROWTH 5 DAYS  Final   Report Status 04/02/2019 FINAL  Final  SARS Coronavirus 2 Ag (30 min TAT) - Nasal Swab (BD Veritor Kit)     Status: None   Collection Time: 03/28/19 12:06 PM   Specimen: Nasal Swab (BD Veritor Kit)  Result Value Ref Range Status   SARS Coronavirus 2 Ag NEGATIVE NEGATIVE Final    Comment: (NOTE) SARS-CoV-2 antigen NOT DETECTED.  Negative results are presumptive.  Negative results do not preclude SARS-CoV-2 infection and should not be used as the sole basis for treatment or other patient management decisions, including infection  control decisions, particularly in the presence of clinical signs and  symptoms consistent with COVID-19, or in those who have been in contact with the virus.  Negative results must be combined with clinical observations, patient history, and epidemiological information. The expected result is Negative. Fact Sheet for Patients: PodPark.tn Fact Sheet for Healthcare Providers: GiftContent.is This test is not yet approved or cleared by the [place] FDA and  has been authorized for detection and/or diagnosis of SARS-CoV-2 by FDA under an Emergency Use Authorization (EUA).  This EUA will remain in effect (meaning this test can be used) for the duration of  the COVID-19 de claration under Section 564(b)(1) of the Act, 21 U.S.C. section 360bbb-3(b)(1), unless the authorization is terminated or revoked sooner. Performed at [facility], [address] [place], [place] [address]   Respiratory Panel by RT PCR (Flu A&B, Covid) - Nasopharyngeal Swab     Status: None   Collection Time: 03/28/19  1:17 PM   Specimen: Nasopharyngeal Swab  Result Value Ref Range Status   SARS Coronavirus 2 by RT PCR NEGATIVE NEGATIVE Final    Comment: (NOTE) SARS-CoV-2 target nucleic acids are NOT DETECTED. The SARS-CoV-2 RNA is generally detectable in upper respiratoy specimens during the acute phase of infection. The lowest concentration of SARS-CoV-2 viral copies this assay can detect is 131 copies/mL. A negative result does not preclude SARS-Cov-2 infection and should not be used as the sole basis for treatment or other patient management decisions. A negative result may occur with  improper specimen collection/handling, submission of specimen other than nasopharyngeal swab, presence of viral mutation(s) within the areas targeted by this  assay, and inadequate number of viral copies (<131 copies/mL). A negative result must be combined with clinical observations, patient history, and epidemiological information. The expected result is Negative. Fact Sheet for Patients:  PinkCheek.be Fact Sheet for Healthcare Providers:  GravelBags.it This test is not yet ap proved or cleared by the [place] FDA and  has been authorized for detection and/or diagnosis of SARS-CoV-2 by FDA under an Emergency Use Authorization (EUA). This EUA will remain  in effect (meaning this test can be  used) for the duration of the COVID-19 declaration under Section 564(b)(1) of the Act, 21 U.S.C. section 360bbb-3(b)(1), unless the authorization is terminated or revoked sooner.    Influenza A by PCR NEGATIVE NEGATIVE Final   Influenza B by PCR NEGATIVE NEGATIVE Final    Comment: (NOTE) The Xpert Xpress SARS-CoV-2/FLU/RSV assay is intended as an aid in  the diagnosis of influenza from Nasopharyngeal swab specimens and  should not be used as a sole basis for treatment. Nasal washings and  aspirates are unacceptable for Xpert Xpress SARS-CoV-2/FLU/RSV  testing. Fact Sheet for Patients: PinkCheek.be Fact Sheet for Healthcare Providers: GravelBags.it This test is not yet approved or cleared by the [place] FDA and  has been authorized for detection and/or diagnosis of SARS-CoV-2 by  FDA under an Emergency Use Authorization (EUA). This EUA will remain  in effect (meaning this test can be used) for the duration of the  Covid-19 declaration under Section 564(b)(1) of the Act, 21  U.S.C. section 360bbb-3(b)(1), unless the authorization is  terminated or revoked. Performed at [facility], [address] [place], [place] [address]   Fungus Culture With Stain     Status: None (Preliminary result)   Collection Time: 03/29/19  3:40 PM   Specimen: PATH Cytology Pleural fluid  Result Value Ref Range Status   Fungus Stain Final report  Final    Comment: (NOTE) Performed At: [facility] [place], [place] [id number] Rush Farmer MD [id number]    Fungus (Mycology) Culture PENDING  Incomplete   Fungal Source PLEURAL  Final    Comment: Performed at [facility], [place] [address] [place], [place] [address]  Culture, body fluid-bottle     Status: None   Collection Time: 03/29/19  3:40 PM   Specimen: Pleura  Result Value Ref Range Status   Specimen Description PLEURAL  Final    Special Requests NONE  Final   Culture   Final    NO GROWTH 5 DAYS Performed at [facility], [address], [place], [place] [address]    Report Status 04/03/2019 FINAL  Final  Gram stain     Status: None   Collection Time: 03/29/19  3:40 PM   Specimen: Pleura  Result Value Ref Range Status   Specimen Description PLEURAL  Final   Special Requests NONE  Final   Gram Stain   Final    MODERATE WBC PRESENT, PREDOMINANTLY PMN NO ORGANISMS SEEN Performed at [facility], [place] [address], [place], [place] [address]    Report Status 03/29/2019 FINAL  Final  Fungus Culture Result     Status: None   Collection Time: 03/29/19  3:40 PM  Result Value Ref Range Status   Result 1 Comment  Final    Comment: (NOTE) KOH/Calcofluor preparation:  no fungus observed. Performed At: [facility] [address] [place], [place] [id number] Rush Farmer MD [id number]   Body fluid culture  Status: Abnormal   Collection Time: 04/02/19  1:02 PM   Specimen: Pleural, Left; Body Fluid  Result Value Ref Range Status   Specimen Description PLEURAL LEFT  Final   Special Requests NONE  Final   Gram Stain   Final    FEW WBC PRESENT, PREDOMINANTLY PMN RARE GRAM POSITIVE COCCI Performed at [facility], [address] [place], [place] [address]    Culture STREPTOCOCCUS INTERMEDIUS (A)  Final   Report Status 04/06/2019 FINAL  Final   Organism ID, Bacteria STREPTOCOCCUS INTERMEDIUS  Final      Susceptibility   Streptococcus intermedius - MIC*    PENICILLIN <=0.06 SENSITIVE Sensitive     CEFTRIAXONE <=0.12 SENSITIVE Sensitive     ERYTHROMYCIN 2 RESISTANT Resistant     LEVOFLOXACIN 0.5 SENSITIVE Sensitive     VANCOMYCIN 0.25 SENSITIVE Sensitive     * STREPTOCOCCUS INTERMEDIUS  Fungus Culture With Stain     Status: None (Preliminary result)   Collection Time: 04/02/19  1:02 PM   Specimen: Pleural, Left; Body Fluid  Result Value Ref Range Status   Fungus Stain Final  report  Final    Comment: (NOTE) Performed At: [facility] [place], [place] [id number] Rush Farmer MD [id number]    Fungus (Mycology) Culture PENDING  Incomplete   Fungal Source PLEURAL  Final    Comment: LEFT Performed at [facility], [place] [address], [place], [place] [address]   Acid Fast Smear (AFB)     Status: None   Collection Time: 04/02/19  1:02 PM   Specimen: Pleural, Left; Body Fluid  Result Value Ref Range Status   AFB Specimen Processing Concentration  Final   Acid Fast Smear Negative  Final    Comment: (NOTE) Performed At: [facility] [place], [place] [id number] Rush Farmer MD [id number]    Source (AFB) PLEURAL  Final    Comment: LEFT Performed at [facility], [place] [address], [place], [place] [address]   Fungus Culture Result     Status: None   Collection Time: 04/02/19  1:02 PM  Result Value Ref Range Status   Result 1 Comment  Final    Comment: (NOTE) KOH/Calcofluor preparation:  no fungus observed. Performed At: [facility] [place], [place] [id number] Rush Farmer MD [id number]   Fungus Culture With Stain     Status: None (Preliminary result)   Collection Time: 04/02/19  1:04 PM   Specimen: Bronchial Washing, Left; Respiratory  Result Value Ref Range Status   Fungus Stain Final report  Final    Comment: (NOTE) Performed At: [facility] [place], [place] [id number] Rush Farmer MD [id number]    Fungus (Mycology) Culture PENDING  Incomplete   Fungal Source BRONCHIAL ALVEOLAR LAVAGE  Final    Comment: LEFT Performed at [facility], [place] [address], [place],  [address]   Culture, respiratory     Status: None   Collection Time: 04/02/19  1:04 PM   Specimen: Bronchial Washing, Left; Respiratory  Result Value Ref Range Status   Specimen Description BRONCHIAL ALVEOLAR LAVAGE LEFT  Final   Special Requests PT ON  ZITHROMAX ZOSYN VANC  Final   Gram Stain   Final    FEW WBC PRESENT, PREDOMINANTLY PMN NO ORGANISMS SEEN    Culture   Final    NO GROWTH 2 DAYS Performed at [facility], [place] [address], [place], [place]  [address]    Report Status 04/04/2019 FINAL  Final  Acid Fast Smear (AFB)     Status: None   Collection Time: 04/02/19  1:04 PM   Specimen: Bronchial Washing, Left; Respiratory  Result Value Ref Range Status   AFB Specimen Processing Concentration  Final   Acid Fast Smear Negative  Final    Comment: (NOTE) Performed At: [facility] [place], [place] [id number] Rush Farmer MD [id number]    Source (AFB) BRONCHIAL ALVEOLAR LAVAGE  Final    Comment: LEFT Performed at [facility], [place] [address] [place], [place] [address]   Fungus Culture Result     Status: None   Collection Time: 04/02/19  1:04 PM  Result Value Ref Range Status   Result 1 Comment  Final    Comment: (NOTE) KOH/Calcofluor preparation:  no fungus observed. Performed At: [facility] [place], [place] [id number] Rush Farmer MD [id number]   Fungus Culture With Stain     Status: None (Preliminary result)   Collection Time: 04/02/19  1:38 PM   Specimen: Pleural, Left; Tissue  Result Value Ref Range Status   Fungus Stain Final report  Final    Comment: (NOTE) Performed At: [facility] [place], [place] [id number] Rush Farmer MD [id number]    Fungus (Mycology) Culture PENDING  Incomplete   Fungal Source TISSUE  Final    Comment: PLEURAL LEFT Performed at [facility], [place] [address] [place], [place] [address]   Aerobic/Anaerobic Culture (surgical/deep wound)     Status: None (Preliminary result)   Collection Time: 04/02/19  1:38 PM   Specimen: Pleural, Left; Tissue  Result Value Ref Range Status   Specimen Description TISSUE PLEURAL LEFT  Final   Special Requests SPEC C  Final   Gram Stain   Final     RARE WBC PRESENT, PREDOMINANTLY PMN NO ORGANISMS SEEN    Culture   Final    NO GROWTH 4 DAYS NO ANAEROBES ISOLATED; CULTURE IN PROGRESS FOR 5 DAYS Performed at [facility], [place] [address], [place], [place] [address]    Report Status PENDING  Incomplete  Acid Fast Smear (AFB)     Status: None   Collection Time: 04/02/19  1:38 PM   Specimen: Pleural, Left; Tissue  Result Value Ref Range Status   AFB Specimen Processing Comment  Final    Comment: Tissue Grinding and Digestion/Decontamination   Acid Fast Smear Negative  Final    Comment: (NOTE) Performed At: [facility] [place], [place] [id number] Rush Farmer MD [id number]    Source (AFB) TISSUE  Final    Comment: PLEURAL LEFT Performed at [facility], [place] [address] [place], [place] [address]   Fungus Culture Result     Status: None   Collection Time: 04/02/19  1:38 PM  Result Value Ref Range Status   Result 1 Comment  Final    Comment: (NOTE) KOH/Calcofluor preparation:  no fungus observed. Performed At: [facility] [place], [place] [id number] Rush Farmer MD [id number]   MRSA PCR Screening     Status: None   Collection Time: 04/02/19  6:20 PM   Specimen: Nasal Mucosa; Nasopharyngeal  Result Value Ref Range Status   MRSA by PCR NEGATIVE NEGATIVE Final    Comment:        The GeneXpert MRSA Assay (FDA approved for NASAL specimens only), is one component of a  comprehensive MRSA colonization surveillance program. It is not intended to diagnose MRSA infection nor to guide or monitor treatment for MRSA infections. Performed at [facility], [place] [address] [place], [place] [address]     Radiology Studies: DG Chest 2 View  Result Date: 04/06/2019 CLINICAL DATA:  Chest tube removal EXAM: CHEST - 2 VIEW COMPARISON:  April 05, 2019 FINDINGS: There has been interval removal of chest tube on the left without pneumothorax. There is persistent  consolidation throughout the left lower lobe with a degree of superimposed pleural effusion. There is slight right base atelectasis. Heart is upper normal in size with pulmonary vascularity normal. No adenopathy. There is marked collapse of the T12 vertebral body. IMPRESSION: No pneumothorax following chest tube removal. Extensive left lower lobe consolidation with effusion on the left. Mild right base atelectasis. Heart upper normal in size. Compression T12 vertebral body noted. Electronically Signed   By: Lowella Grip III M.D.   On: 04/06/2019 09:06     Ruhani Umland T. [place]  If 7PM-7AM, please contact night-coverage [email or web address] Password [facility] 04/06/2019, 2:39 PM

## 2019-04-06 NOTE — Progress Notes (Signed)
PT Cancellation Note  Patient Details Name: Brooke Ellis MRN: [id number] DOB: 1950-10-29   Cancelled Treatment:    Reason Eval/Treat Not Completed: Other (comment) Pt sleeping and husband declined. PT will continue to follow acutely.   Earney Navy, PTA Acute Rehabilitation Services Pager: [phone] Office: [phone]   04/06/2019, 4:36 PM

## 2019-04-06 NOTE — Progress Notes (Signed)
Pt ambulated in hallway on room air. Pt maintained sats at 95%. Pt tolerated well.

## 2019-04-07 DIAGNOSIS — D500 Iron deficiency anemia secondary to blood loss (chronic): Secondary | ICD-10-CM

## 2019-04-07 DIAGNOSIS — E119 Type 2 diabetes mellitus without complications: Secondary | ICD-10-CM

## 2019-04-07 LAB — AEROBIC/ANAEROBIC CULTURE W GRAM STAIN (SURGICAL/DEEP WOUND): Culture: NO GROWTH

## 2019-04-07 LAB — GLUCOSE, CAPILLARY: Glucose-Capillary: 94 mg/dL (ref 70–99)

## 2019-04-07 MED ORDER — SENNOSIDES-DOCUSATE SODIUM 8.6-50 MG PO TABS: 1.0000 | ORAL_TABLET | Freq: Two times a day (BID) | ORAL | 1 refills | Status: DC | PRN

## 2019-04-07 MED ORDER — TRAMADOL HCL 50 MG PO TABS: 50.0000 mg | ORAL_TABLET | Freq: Four times a day (QID) | ORAL | 0 refills | Status: DC | PRN

## 2019-04-07 MED ORDER — FERROUS SULFATE 325 (65 FE) MG PO TABS: 325.0000 mg | ORAL_TABLET | Freq: Two times a day (BID) | ORAL | 1 refills | Status: DC

## 2019-04-07 MED ORDER — METFORMIN HCL 500 MG PO TABS: 500.0000 mg | ORAL_TABLET | Freq: Two times a day (BID) | ORAL | 1 refills | Status: DC

## 2019-04-07 MED ORDER — SENNOSIDES-DOCUSATE SODIUM 8.6-50 MG PO TABS: 1.0000 | ORAL_TABLET | Freq: Every day | ORAL | 1 refills | Status: DC

## 2019-04-07 MED ORDER — SACCHAROMYCES BOULARDII 250 MG PO CAPS: 250.0000 mg | ORAL_CAPSULE | Freq: Two times a day (BID) | ORAL | 0 refills | Status: DC

## 2019-04-07 MED ORDER — AMOXICILLIN-POT CLAVULANATE 875-125 MG PO TABS: 1.0000 | ORAL_TABLET | Freq: Two times a day (BID) | ORAL | 0 refills | Status: DC

## 2019-04-07 MED ORDER — GUAIFENESIN ER 600 MG PO TB12: 600.0000 mg | ORAL_TABLET | Freq: Two times a day (BID) | ORAL | Status: DC

## 2019-04-07 NOTE — Discharge Summary (Signed)
Physician Discharge Summary  Brooke Ellis YFV:[id number] DOB: December 14, 1950 DOA: 03/28/2019  PCP: Derinda Late, MD  Admit date: 03/28/2019 Discharge date: 04/07/2019  Admitted From: Home Disposition: Home  Recommendations for Outpatient Follow-up:  1. Follow ups as below. 2. Please obtain CBC/BMP/Mag at follow up 3. Please follow up on the following pending results: None  Home Health: None Equipment/Devices: None  Discharge Condition: Stable CODE STATUS: Full code  Follow-up Information    [name], MD. Go on 04/19/2019.   Specialty: Cardiothoracic Surgery Why: PA/LAT CXR to be taken (at [facility] which is in the same building as Dr. Genice Rouge office) on 04/01 at 8:30 am;Appointment time is at 9:00 am Contact information: [address] [place] [address] [phone]        Derinda Late, MD. Schedule an appointment as soon as possible for a visit in 1 week(s).   Specialty: Family Medicine Contact information: [place] [address] [phone]           Hospital Course: 69 year old female with history of prediabetes, IDA, GERD and Barrett's esophagus presented with cough, pleuritic chest pain and systemic symptoms and admitted for pneumonia and empyema of left lung.   She underwent VATS procedure by Dr. Servando Snare on 3/15 with 2 chest tube placements.  Pleural fluid culture negative. Chest tube removed on 3/17 and 3/18.    Respiratory failure resolved.  She was liberated off oxygen and maintain appropriate saturation with ambulation on room air.  Antibiotic descalated to Augmentin.  Discharged on Augmentin for 15 more days given significant residual left-sided pleural effusion and left lung opacity.  Advised to take Florastor/probiotics to minimize risk of diarrhea.  Patient also started on p.o. ferrous sulfate for iron deficiency anemia, and low-dose Metformin for new diagnosis of DM-2.  She is already on  statin.  Encourage annual eye exam.  Outpatient follow-up with PCP and CTS as above.  Discharge Diagnoses:  Acute respiratory failure with hypoxia due to pneumonia/empyema:  Resolved. -Encouraged to continue incentive spirometry at home.  Pneumonia with empyema-still with mod to large left effusion and LLL consolidation on chest x-ray. -s/p VATS and chest tube placement by Dr. Servando Snare on 3/15 -Chest tube removed on 3/17 and 3/18. -Blood and pleural fluid cultures, fungal culture, AFB and MRSA PCR negative. -Zosyn 3/10-3/15, Unasyn 3/15>> 3/19.  Augmentin 3/19> 4/3 -Patient to continue probiotics -Follow-up with CTS as above.  Leukocytosis/bandemia: Likely due to the above.    Improved. -Recheck CBC at follow-up  Diet controlled DM-2: A1c 6.6%. Recent Labs    04/06/19 1659 04/06/19 2118 04/07/19 0558  GLUCAP 79 110* 94  Discharged on Metformin 500 mg twice daily Continue Zetia and Crestor  Diet esophagus -Continue PPI  Hypokalemia: Resolved.  Mood disorder: Stable -Continue home Prozac and Wellbutrin  Normocytic anemia: H&H stable.  Anemia panel with mild iron deficiency -P.o. ferrous sulfate with bowel regimen  T11 compression fracture-reported as T12 compression fracture on chest x-ray but likely old T11 compression fracture noted on MRI about a year ago.  No back pain. -Calcium and vitamin D  Family communication-updated patient's husband at bedside.  Discharge Instructions  Discharge Instructions    Call MD for:  difficulty breathing, headache or visual disturbances   Complete by: As directed    Call MD for:  extreme fatigue   Complete by: As directed    Call MD for:  persistant dizziness or light-headedness   Complete by: As directed  Call MD for:  persistant nausea and vomiting   Complete by: As directed    Call MD for:  redness, tenderness, or signs of infection (pain, swelling, redness, odor or green/yellow discharge around incision site)    Complete by: As directed    Call MD for:  severe uncontrolled pain   Complete by: As directed    Call MD for:  temperature >100.4   Complete by: As directed    Diet Carb Modified   Complete by: As directed    Discharge instructions   Complete by: As directed    It has been a pleasure taking care of you! You were hospitalized with left-sided chest pain due to pneumonia and pleural effusion (pus/fluid around your left lung).  You were treated surgically medically for this condition.  We are discharging you on more antibiotic to complete treatment course.  We have also started you on iron pills for anemia.  We also recommend restarting your Metformin for diabetes.  Please review your new medication list and the directions before you take your medications.  You may shower but avoids soaking or lying in bathtub.   Take care,   Increase activity slowly   Complete by: As directed      Allergies as of 04/07/2019      Reactions   Macrodantin    Chemical pneumonia    Morphine And Related    hallucinations    Sulfa Antibiotics Itching, Swelling      Medication List    STOP taking these medications   levofloxacin 500 MG tablet Commonly known as: LEVAQUIN   metFORMIN 500 MG 24 hr tablet Commonly known as: GLUCOPHAGE-XR Replaced by: metFORMIN 500 MG tablet     TAKE these medications   amoxicillin-clavulanate 875-125 MG tablet Commonly known as: AUGMENTIN Take 1 tablet by mouth every 12 (twelve) hours.   buPROPion 300 MG 24 hr tablet Commonly known as: WELLBUTRIN XL Take 300 mg by mouth daily.   ezetimibe 10 MG tablet Commonly known as: ZETIA Take 10 mg by mouth every evening.   ferrous sulfate 325 (65 FE) MG tablet Take 1 tablet (325 mg total) by mouth 2 (two) times daily with a meal.   FLUoxetine 10 MG capsule Commonly known as: PROZAC Take 10 mg by mouth daily.   guaiFENesin 600 MG 12 hr tablet Commonly known as: MUCINEX Take 1 tablet (600 mg total) by mouth 2  (two) times daily.   ketotifen 0.025 % ophthalmic solution Commonly known as: ZADITOR Place 1 drop into both eyes daily as needed. For seasonal allergies   metFORMIN 500 MG tablet Commonly known as: Glucophage Take 1 tablet (500 mg total) by mouth 2 (two) times daily with a meal. Replaces: metFORMIN 500 MG 24 hr tablet   montelukast 10 MG tablet Commonly known as: SINGULAIR Take 10 mg by mouth daily.   omeprazole 40 MG capsule Commonly known as: PRILOSEC Take 1 tablet 30 minutes before breakfast and 1 tablet 30 minutes before supper   ondansetron 4 MG disintegrating tablet Commonly known as: ZOFRAN-ODT Take 4 mg by mouth every 8 (eight) hours as needed for nausea or vomiting.   rosuvastatin 40 MG tablet Commonly known as: CRESTOR Take 40 mg by mouth daily.   saccharomyces boulardii 250 MG capsule Commonly known as: Florastor Take 1 capsule (250 mg total) by mouth 2 (two) times daily.   senna-docusate 8.6-50 MG tablet Commonly known as: Senokot-S Take 1 tablet by mouth 2 (two) times daily as needed  for mild constipation or moderate constipation.   traMADol 50 MG tablet Commonly known as: ULTRAM Take 1 tablet (50 mg total) by mouth every 6 (six) hours as needed (mild pain).   Vitamin D 50 MCG (2000 UT) tablet Take 2,000 Units by mouth daily.       Consultations:  Cardiothoracic surgery  Procedures/Studies:  3/15-VATS procedure with chest tube placement   DG Chest 2 View  Result Date: 04/06/2019 CLINICAL DATA:  Chest tube removal EXAM: CHEST - 2 VIEW COMPARISON:  April 05, 2019 FINDINGS: There has been interval removal of chest tube on the left without pneumothorax. There is persistent consolidation throughout the left lower lobe with a degree of superimposed pleural effusion. There is slight right base atelectasis. Heart is upper normal in size with pulmonary vascularity normal. No adenopathy. There is marked collapse of the T12 vertebral body. IMPRESSION: No  pneumothorax following chest tube removal. Extensive left lower lobe consolidation with effusion on the left. Mild right base atelectasis. Heart upper normal in size. Compression T12 vertebral body noted. Electronically Signed   By: Lowella Grip III M.D.   On: 04/06/2019 09:06   CT Angio Chest PE W and/or Wo Contrast  Result Date: 03/28/2019 CLINICAL DATA:  PE suspected, high prob. Shortness of breath for 2 weeks. Chest pain. EXAM: CT ANGIOGRAPHY CHEST WITH CONTRAST TECHNIQUE: Multidetector CT imaging of the chest was performed using the standard protocol during bolus administration of intravenous contrast. Multiplanar CT image reconstructions and MIPs were obtained to evaluate the vascular anatomy. CONTRAST:  86m OMNIPAQUE IOHEXOL 350 MG/ML SOLN COMPARISON:  Chest radiograph earlier this day demonstrating left pleural effusion. FINDINGS: Cardiovascular: There are no filling defects within the pulmonary arteries to suggest pulmonary embolus. Evaluation is diagnostic to the segmental levels. Cannot assess subsegmental branches due to contrast bolus timing. Thoracic aorta is normal in caliber. No aortic dissection. Heart is normal in size, displaced anteriorly by a large hiatal hernia. No pericardial effusion. Mediastinum/Nodes: Large hiatal hernia, greater than 50% of the stomach is intrathoracic. Mid esophagus is patulous and contains small air-fluid level. Soft tissue density in the left hilum that is subcentimeter, likely represents prominent hilar lymph nodes. There is no right hilar adenopathy. Prominent AP window nodes at 7 and 8 mm. Lobulated fluid/soft tissue density in the anterior mediastinum that is ill-defined and contiguous with loculated pleural fluid, difficult to exclude underlying lymph nodes, for example series 10, images 78 and 90. Lungs/Pleura: Moderate size left pleural effusion is partially loculated. There is lobulated fluid in the inter lobar fissure. Ill-defined lingular  consolidation has central low density with focus of air suspicious for necrosis, series 4 and 5 image 44. Consolidation in the left lower lobe and dependent left upper lobe likely represents compressive atelectasis. No right pleural effusion. Compressive atelectasis in the right lower lobe related to hiatal hernia as well as dependently. Upper Abdomen: Post cholecystectomy. No acute findings. Large hiatal hernia with greater than 50% of the stomach intrathoracic. Musculoskeletal: Chronic T11 compression fracture when compared with lumbar spine MRI 03/23/2018 no acute osseous abnormality or evidence of focal bone lesion. Degenerative change of both shoulders. Review of the MIP images confirms the above findings. IMPRESSION: 1. No pulmonary embolus to the segmental level. 2. Heterogeneous lingular consolidation with ill-defined low-density and focus of air suspicious for necrotic process. Differential considerations include necrotic mass versus abscess/pneumonia. Moderate size left pleural effusion which is partially loculated, with fluid tracking into the interlobar fissure. Recommend fluid and/or tissue sampling. 3.  Consolidation in the left lower lobe and dependent left upper lobe likely represents compressive atelectasis related to pleural effusion, however infectious etiology/pneumonia is also considered. 4. Large hiatal hernia with greater than 50% of the stomach intrathoracic. Patulous mid esophagus containing air-fluid level. 5. Prominent AP window and left hilar nodes are likely reactive. Additional density along the left upper mediastinum felt to be related to loculated pleural fluid rather than enlarged lymph nodes, however recommend attention on follow-up. Electronically Signed   By: Keith Rake M.D.   On: 03/28/2019 13:56   DG CHEST PORT 1 VIEW  Result Date: 04/05/2019 CLINICAL DATA:  Pleural effusion EXAM: PORTABLE CHEST 1 VIEW COMPARISON:  04/04/2019 FINDINGS: Left chest tube in place with  continued moderate to large left effusion. No pneumothorax. Bibasilar airspace opacities, likely atelectasis, unchanged. Heart is borderline in size. No acute bony abnormality. IMPRESSION: Left chest tube with stable moderate to large left effusion. No pneumothorax. Bibasilar atelectasis, unchanged. Electronically Signed   By: Rolm Baptise M.D.   On: 04/05/2019 08:29   DG CHEST PORT 1 VIEW  Result Date: 04/04/2019 CLINICAL DATA:  Pneumothorax EXAM: PORTABLE CHEST 1 VIEW COMPARISON:  04/03/2019 FINDINGS: No significant change in AP portable radiographs with moderate pleural effusion, associated atelectasis or consolidation, and left-sided chest tubes in position. There is no significant pneumothorax. Trace right pleural effusion. No new airspace opacity. Cardiomegaly. IMPRESSION: No significant change in AP portable radiographs with moderate pleural effusion, associated atelectasis or consolidation, and left-sided chest tubes in position. There is no significant pneumothorax. Electronically Signed   By: Eddie Candle M.D.   On: 04/04/2019 09:22   DG CHEST PORT 1 VIEW  Result Date: 04/03/2019 CLINICAL DATA:  Post lung surgery, chest tubes EXAM: PORTABLE CHEST 1 VIEW COMPARISON:  04/02/2019 FINDINGS: Left chest tubes remain in place. No pneumothorax. Moderate left pleural effusion with left base atelectasis. Heart is borderline in size. Mild vascular congestion. No confluent opacity on the right. IMPRESSION: Left chest tubes in stable position.  No pneumothorax. Moderate left pleural effusion with left base atelectasis. Mild vascular congestion. Electronically Signed   By: Rolm Baptise M.D.   On: 04/03/2019 09:14   DG Chest Port 1 View  Result Date: 04/02/2019 CLINICAL DATA:  Left empyema. Status post drainage of empyema and decortication. EXAM: PORTABLE CHEST 1 VIEW COMPARISON:  Chest x-ray dated 04/01/2019 and chest CT dated 03/28/2019 FINDINGS: 2 left-sided chest tubes are now in place. No pneumothorax.  Significant improvement in the aeration of the left lung after drainage of the empyema. There is residual atelectasis at the left lung base. Right lung is clear. Large hiatal hernia. Heart size and vascularity are normal. No acute bone abnormality. IMPRESSION: Significant improvement in the aeration of the left lung after drainage of the empyema. Electronically Signed   By: Lorriane Shire M.D.   On: 04/02/2019 15:35   DG CHEST PORT 1 VIEW  Result Date: 04/01/2019 CLINICAL DATA:  Pleural effusion EXAM: PORTABLE CHEST 1 VIEW COMPARISON:  03/31/2019 FINDINGS: Moderate to large left pleural effusion with apical capping. Associated pigtail drain. Overall, this is similar to the prior, although increased loculated fluid in the left upper lobe is possible. Associated left lower lobe atelectasis. Right basilar atelectasis. No pneumothorax. The heart is normal in size. IMPRESSION: Moderate to large left pleural effusion with associated left chest drain. No pneumothorax. When compared to the prior, increased loculated fluid in the left upper lobe is possible. Electronically Signed   By: Henderson Newcomer.D.  On: 04/01/2019 07:05   DG Chest Port 1 View  Result Date: 03/31/2019 CLINICAL DATA:  Follow-up left chest tube EXAM: PORTABLE CHEST 1 VIEW COMPARISON:  03/30/2019 FINDINGS: Moderate to large left pleural effusion with apical capping. Indwelling left pigtail chest drain. Associated left lower lobe compressive atelectasis. Minimal right basilar atelectasis. Right lung is otherwise clear. No pneumothorax. The heart is top-normal in size. IMPRESSION: Moderate to large left pleural effusion. Associated left lower lobe compressive atelectasis. Indwelling left pigtail chest drain. Electronically Signed   By: Julian Hy M.D.   On: 03/31/2019 06:49   DG Chest Port 1 View  Result Date: 03/30/2019 CLINICAL DATA:  Chest tube placement. EXAM: PORTABLE CHEST 1 VIEW COMPARISON:  Chest x-ray 03/28/2019 FINDINGS:  There is a left-sided Cope loop type pleural drainage catheter noted on the left side. Persistent moderate to large complex left pleural fluid collection with new pleural fluid noted along the left lung apex. Stable mild rightward deviation of the trachea somewhat accentuated by the rotation of the patient. Streaky right basilar atelectasis is noted. Remote healed rib fractures. IMPRESSION: Left-sided Cope loop type pleural drainage catheter in place but persistent large complex left pleural fluid collection with new pleural fluid noted over the left apex. Electronically Signed   By: Marijo Sanes M.D.   On: 03/30/2019 07:31   DG Chest Port 1 View  Result Date: 03/28/2019 CLINICAL DATA:  Shortness of breath and chest pain EXAM: PORTABLE CHEST 1 VIEW COMPARISON:  10/26/2010 FINDINGS: Cardiac silhouette is partially obscured but appears mildly enlarged. Moderate left-sided pleural effusion with associated left basilar opacity. Streaky right basilar opacity with probable trace right pleural effusion. No pneumothorax. IMPRESSION: 1. Moderate left-sided pleural effusion with associated left basilar opacity, which may reflect atelectasis and/or pneumonia. 2. Streaky right basilar opacity with probable trace right pleural effusion. Electronically Signed   By: Davina Poke D.O.   On: 03/28/2019 12:42   CT IMAGE GUIDED FLUID DRAIN BY CATHETER  Result Date: 03/29/2019 INDICATION: Left-sided pneumonia with loculated left pleural fluid collection suspicious for empyema. EXAM: CT-GUIDED LEFT PLEURAL DRAINAGE CATHETER PLACEMENT MEDICATIONS: The patient is currently admitted to the hospital and receiving intravenous antibiotics. The antibiotics were administered within an appropriate time frame prior to the initiation of the procedure. ANESTHESIA/SEDATION: Fentanyl 100 mcg IV; Versed 4.0 mg IV Moderate Sedation Time:  28 minutes. The patient was continuously monitored during the procedure by the interventional  radiology nurse under my direct supervision. COMPLICATIONS: None immediate. PROCEDURE: Informed written consent was obtained from the patient after a thorough discussion of the procedural risks, benefits and alternatives. All questions were addressed. Maximal Sterile Barrier Technique was utilized including caps, mask, sterile gowns, sterile gloves, sterile drape, hand hygiene and skin antiseptic. A timeout was performed prior to the initiation of the procedure. CT was performed through the chest in a supine position with the left side rolled up slightly. From a lateral approach, an 18 gauge trocar needle was advanced under CT guidance into the posterolateral left pleural space. After return of fluid, a guidewire was advanced into the pleural space. The tract was dilated and a 12 French pigtail drainage catheter advanced. Catheter position was confirmed by CT. The catheter was aspirated to obtain fluid samples for requested laboratory testing. The catheter was connected to a [employer] device. It was secured at the skin with a Prolene retention suture and Vaseline gauze dressing. FINDINGS: Aspiration of the loculated left pleural effusion yielded thin yellow fluid. After chest tube placement,  the catheter is positioned in the posterior pleural space. There is good return of fluid from the drainage catheter. IMPRESSION: CT-guided placement of 12 French pigtail pleural drainage catheter into the posterior aspect of a loculated left pleural effusion/empyema. The drainage catheter was attached to a Pleur-evac device which will be attached to wall suction at -20 cm of water. Electronically Signed   By: Aletta Edouard M.D.   On: 03/29/2019 16:47      Discharge Exam: Vitals:   04/07/19 0300 04/07/19 0723  BP: 128/69 115/66  Pulse: 81 89  Resp: 18 20  Temp: 98.5 F (36.9 C) 98.5 F (36.9 C)  SpO2: 96% 92%    GENERAL: No acute distress.  Appears well.  HEENT: MMM.  Vision and hearing grossly intact.   NECK: Supple.  No apparent JVD.  RESP: On room air.  No IWOB.  Slightly diminished aeration and crackles on the left. CVS:  RRR. Heart sounds normal.  ABD/GI/GU: Bowel sounds present. Soft. Non tender.  MSK/EXT:  Moves extremities. No apparent deformity or edema.  SKIN: Stitch's over left chest  NEURO: Awake, alert and oriented appropriately.  No apparent focal neuro deficit. PSYCH: Calm. Normal affect.   The results of significant diagnostics from this hospitalization (including imaging, microbiology, ancillary and laboratory) are listed below for reference.     Microbiology: Recent Results (from the past 240 hour(s))  Blood Culture (routine x 2)     Status: None   Collection Time: 03/28/19 12:06 PM   Specimen: BLOOD  Result Value Ref Range Status   Specimen Description BLOOD RIGHT ANTECUBITAL  Final   Special Requests   Final    BOTTLES DRAWN AEROBIC AND ANAEROBIC Blood Culture adequate volume Performed at [facility], [address] [place], [place] [address]    Culture NO GROWTH 5 DAYS  Final   Report Status 04/02/2019 FINAL  Final  Blood Culture (routine x 2)     Status: None   Collection Time: 03/28/19 12:06 PM   Specimen: BLOOD  Result Value Ref Range Status   Specimen Description BLOOD LEFT ANTECUBITAL  Final   Special Requests   Final    BOTTLES DRAWN AEROBIC AND ANAEROBIC Blood Culture adequate volume Performed at [facility], [address] [place], [place] [address]    Culture NO GROWTH 5 DAYS  Final   Report Status 04/02/2019 FINAL  Final  SARS Coronavirus 2 Ag (30 min TAT) - Nasal Swab (BD Veritor Kit)     Status: None   Collection Time: 03/28/19 12:06 PM   Specimen: Nasal Swab (BD Veritor Kit)  Result Value Ref Range Status   SARS Coronavirus 2 Ag NEGATIVE NEGATIVE Final    Comment: (NOTE) SARS-CoV-2 antigen NOT DETECTED.  Negative results are presumptive.  Negative results do not preclude SARS-CoV-2 infection and should not be  used as the sole basis for treatment or other patient management decisions, including infection  control decisions, particularly in the presence of clinical signs and  symptoms consistent with COVID-19, or in those who have been in contact with the virus.  Negative results must be combined with clinical observations, patient history, and epidemiological information. The expected result is Negative. Fact Sheet for Patients: PodPark.tn Fact Sheet for Healthcare Providers: GiftContent.is This test is not yet approved or cleared by the [place] FDA and  has been authorized for detection and/or diagnosis of SARS-CoV-2 by FDA under an Emergency Use Authorization (EUA).  This EUA will  remain in effect (meaning this test can be used) for the duration of  the COVID-19 de claration under Section 564(b)(1) of the Act, 21 U.S.C. section 360bbb-3(b)(1), unless the authorization is terminated or revoked sooner. Performed at [facility], [address] [place], [place] [address]   Respiratory Panel by RT PCR (Flu A&B, Covid) - Nasopharyngeal Swab     Status: None   Collection Time: 03/28/19  1:17 PM   Specimen: Nasopharyngeal Swab  Result Value Ref Range Status   SARS Coronavirus 2 by RT PCR NEGATIVE NEGATIVE Final    Comment: (NOTE) SARS-CoV-2 target nucleic acids are NOT DETECTED. The SARS-CoV-2 RNA is generally detectable in upper respiratoy specimens during the acute phase of infection. The lowest concentration of SARS-CoV-2 viral copies this assay can detect is 131 copies/mL. A negative result does not preclude SARS-Cov-2 infection and should not be used as the sole basis for treatment or other patient management decisions. A negative result may occur with  improper specimen collection/handling, submission of specimen other than nasopharyngeal swab, presence of viral mutation(s) within the areas targeted by this  assay, and inadequate number of viral copies (<131 copies/mL). A negative result must be combined with clinical observations, patient history, and epidemiological information. The expected result is Negative. Fact Sheet for Patients:  PinkCheek.be Fact Sheet for Healthcare Providers:  GravelBags.it This test is not yet ap proved or cleared by the [place] FDA and  has been authorized for detection and/or diagnosis of SARS-CoV-2 by FDA under an Emergency Use Authorization (EUA). This EUA will remain  in effect (meaning this test can be used) for the duration of the COVID-19 declaration under Section 564(b)(1) of the Act, 21 U.S.C. section 360bbb-3(b)(1), unless the authorization is terminated or revoked sooner.    Influenza A by PCR NEGATIVE NEGATIVE Final   Influenza B by PCR NEGATIVE NEGATIVE Final    Comment: (NOTE) The Xpert Xpress SARS-CoV-2/FLU/RSV assay is intended as an aid in  the diagnosis of influenza from Nasopharyngeal swab specimens and  should not be used as a sole basis for treatment. Nasal washings and  aspirates are unacceptable for Xpert Xpress SARS-CoV-2/FLU/RSV  testing. Fact Sheet for Patients: PinkCheek.be Fact Sheet for Healthcare Providers: GravelBags.it This test is not yet approved or cleared by the [place] FDA and  has been authorized for detection and/or diagnosis of SARS-CoV-2 by  FDA under an Emergency Use Authorization (EUA). This EUA will remain  in effect (meaning this test can be used) for the duration of the  Covid-19 declaration under Section 564(b)(1) of the Act, 21  U.S.C. section 360bbb-3(b)(1), unless the authorization is  terminated or revoked. Performed at [facility], [address] [place], [place] [address]   Fungus Culture With Stain     Status: None (Preliminary result)   Collection Time:  03/29/19  3:40 PM   Specimen: PATH Cytology Pleural fluid  Result Value Ref Range Status   Fungus Stain Final report  Final    Comment: (NOTE) Performed At: [facility] [place], [place] [id number] Rush Farmer MD WG:[id number]    Fungus (Mycology) Culture PENDING  Incomplete   Fungal Source PLEURAL  Final    Comment: Performed at [facility], [place] [address] [place], [place] [address]  Culture, body fluid-bottle     Status: None   Collection Time: 03/29/19  3:40 PM   Specimen: Pleura  Result Value Ref Range Status   Specimen  Description PLEURAL  Final   Special Requests NONE  Final   Culture   Final    NO GROWTH 5 DAYS Performed at [place] [address] [place], [place] [address]    Report Status 04/03/2019 FINAL  Final  Gram stain     Status: None   Collection Time: 03/29/19  3:40 PM   Specimen: Pleura  Result Value Ref Range Status   Specimen Description PLEURAL  Final   Special Requests NONE  Final   Gram Stain   Final    MODERATE WBC PRESENT, PREDOMINANTLY PMN NO ORGANISMS SEEN Performed at [facility], [place] [address] [place], [place] [address]    Report Status 03/29/2019 FINAL  Final  Fungus Culture Result     Status: None   Collection Time: 03/29/19  3:40 PM  Result Value Ref Range Status   Result 1 Comment  Final    Comment: (NOTE) KOH/Calcofluor preparation:  no fungus observed. Performed At: [facility] [place], [place] [id number] Rush Farmer MD [id number]   Body fluid culture     Status: Abnormal   Collection Time: 04/02/19  1:02 PM   Specimen: Pleural, Left; Body Fluid  Result Value Ref Range Status   Specimen Description PLEURAL LEFT  Final   Special Requests NONE  Final   Gram Stain   Final    FEW WBC PRESENT, PREDOMINANTLY PMN RARE GRAM POSITIVE COCCI Performed at [facility], [address] [place], [place] [address]    Culture STREPTOCOCCUS  INTERMEDIUS (A)  Final   Report Status 04/06/2019 FINAL  Final   Organism ID, Bacteria STREPTOCOCCUS INTERMEDIUS  Final      Susceptibility   Streptococcus intermedius - MIC*    PENICILLIN <=0.06 SENSITIVE Sensitive     CEFTRIAXONE <=0.12 SENSITIVE Sensitive     ERYTHROMYCIN 2 RESISTANT Resistant     LEVOFLOXACIN 0.5 SENSITIVE Sensitive     VANCOMYCIN 0.25 SENSITIVE Sensitive     * STREPTOCOCCUS INTERMEDIUS  Fungus Culture With Stain     Status: None (Preliminary result)   Collection Time: 04/02/19  1:02 PM   Specimen: Pleural, Left; Body Fluid  Result Value Ref Range Status   Fungus Stain Final report  Final    Comment: (NOTE) Performed At: [facility] [place], [place] [id number] Rush Farmer MD ZS:[id number]    Fungus (Mycology) Culture PENDING  Incomplete   Fungal Source PLEURAL  Final    Comment: LEFT Performed at [facility], [place] [address] [place], [place] [address]   Acid Fast Smear (AFB)     Status: None   Collection Time: 04/02/19  1:02 PM   Specimen: Pleural, Left; Body Fluid  Result Value Ref Range Status   AFB Specimen Processing Concentration  Final   Acid Fast Smear Negative  Final    Comment: (NOTE) Performed At: [facility] [place], [place] [id number] Rush Farmer MD OI:[id number]    Source (AFB) PLEURAL  Final    Comment: LEFT Performed at [facility], [place] [address] [place],  [address]   Fungus Culture Result     Status: None   Collection Time: 04/02/19  1:02 PM  Result Value Ref Range Status   Result 1 Comment  Final    Comment: (NOTE) KOH/Calcofluor preparation:  no fungus observed. Performed At: [facility] [place], [place] [id number] Rush Farmer MD JS:[id number]   Fungus  Culture With Stain     Status: None (Preliminary result)   Collection Time: 04/02/19  1:04 PM   Specimen: Bronchial Washing, Left; Respiratory  Result Value Ref Range Status    Fungus Stain Final report  Final    Comment: (NOTE) Performed At: [facility] [place], [place] [id number] Rush Farmer MD JD:[id number]    Fungus (Mycology) Culture PENDING  Incomplete   Fungal Source BRONCHIAL ALVEOLAR LAVAGE  Final    Comment: LEFT Performed at [facility], [place] [address], [place], [place] [address]   Culture, respiratory     Status: None   Collection Time: 04/02/19  1:04 PM   Specimen: Bronchial Washing, Left; Respiratory  Result Value Ref Range Status   Specimen Description BRONCHIAL ALVEOLAR LAVAGE LEFT  Final   Special Requests PT ON ZITHROMAX ZOSYN VANC  Final   Gram Stain   Final    FEW WBC PRESENT, PREDOMINANTLY PMN NO ORGANISMS SEEN    Culture   Final    NO GROWTH 2 DAYS Performed at [facility], [place] [address] [place], [place] [address]    Report Status 04/04/2019 FINAL  Final  Acid Fast Smear (AFB)     Status: None   Collection Time: 04/02/19  1:04 PM   Specimen: Bronchial Washing, Left; Respiratory  Result Value Ref Range Status   AFB Specimen Processing Concentration  Final   Acid Fast Smear Negative  Final    Comment: (NOTE) Performed At: [facility] [place], [place] [id number] Rush Farmer MD ZN:[id number]    Source (AFB) BRONCHIAL ALVEOLAR LAVAGE  Final    Comment: LEFT Performed at [facility], [place] [address] [place], [place] [address]   Fungus Culture Result     Status: None   Collection Time: 04/02/19  1:04 PM  Result Value Ref Range Status   Result 1 Comment  Final    Comment: (NOTE) KOH/Calcofluor preparation:  no fungus observed. Performed At: [facility] [place], [place] [id number] Rush Farmer MD [id number]   Fungus Culture With Stain     Status: None (Preliminary result)   Collection Time: 04/02/19  1:38 PM   Specimen: Pleural, Left; Tissue  Result Value Ref Range Status   Fungus Stain Final report  Final     Comment: (NOTE) Performed At: [facility] [place], [place] [id number] Rush Farmer MD JW:[id number]    Fungus (Mycology) Culture PENDING  Incomplete   Fungal Source TISSUE  Final    Comment: PLEURAL LEFT Performed at [facility], [place] [address] [place], [place] [address]   Aerobic/Anaerobic Culture (surgical/deep wound)     Status: None (Preliminary result)   Collection Time: 04/02/19  1:38 PM   Specimen: Pleural, Left; Tissue  Result Value Ref Range Status   Specimen Description TISSUE PLEURAL LEFT  Final   Special Requests SPEC C  Final   Gram Stain   Final    RARE WBC PRESENT, PREDOMINANTLY PMN NO ORGANISMS SEEN    Culture   Final    NO GROWTH 4 DAYS NO ANAEROBES ISOLATED; CULTURE IN PROGRESS FOR 5 DAYS Performed at [facility], [place] [address] [place], [place] [address]    Report Status PENDING  Incomplete  Acid Fast Smear (AFB)     Status: None   Collection Time: 04/02/19  1:38 PM   Specimen: Pleural, Left; Tissue  Result Value Ref Range Status  AFB Specimen Processing Comment  Final    Comment: Tissue Grinding and Digestion/Decontamination   Acid Fast Smear Negative  Final    Comment: (NOTE) Performed At: [facility] [place], [place] [id number] Rush Farmer MD WL:[id number]    Source (AFB) TISSUE  Final    Comment: PLEURAL LEFT Performed at [facility], [place] [address], [place], [place] [address]   Fungus Culture Result     Status: None   Collection Time: 04/02/19  1:38 PM  Result Value Ref Range Status   Result 1 Comment  Final    Comment: (NOTE) KOH/Calcofluor preparation:  no fungus observed. Performed At: [facility] [place], [place] [id number] Rush Farmer MD [id number]   MRSA PCR Screening     Status: None   Collection Time: 04/02/19  6:20 PM   Specimen: Nasal Mucosa; Nasopharyngeal  Result Value Ref Range Status   MRSA by PCR NEGATIVE NEGATIVE  Final    Comment:        The GeneXpert MRSA Assay (FDA approved for NASAL specimens only), is one component of a comprehensive MRSA colonization surveillance program. It is not intended to diagnose MRSA infection nor to guide or monitor treatment for MRSA infections. Performed at [facility], [place] [address], [place], [place] [address]      Labs: BNP (last 3 results) No results for input(s): BNP in the last 8760 hours. Basic Metabolic Panel: Recent Labs  Lab 04/01/19 0521 04/02/19 0306 04/03/19 0245 04/04/19 0219 04/05/19 0225  NA 141 140 138 142 140  K 3.5 3.8 3.4* 3.2* 3.8  CL 102 104 99 99 100  CO2 '28 24 29 30 29  '$ GLUCOSE 122* 98 157* 97 98  BUN 6* <5* <5* 5* 5*  CREATININE 0.47 0.72 0.63 0.68 0.60  CALCIUM 8.4* 8.3* 8.4* 8.5* 8.1*  MG  --   --   --  1.7 1.8  PHOS  --   --   --   --  3.8   Liver Function Tests: Recent Labs  Lab 04/02/19 0306 04/04/19 0219 04/05/19 0225  AST 82* 38  --   ALT 62* 38  --   ALKPHOS 146* 136*  --   BILITOT 0.4 0.4  --   PROT 5.7* 5.7*  --   ALBUMIN 1.7* 1.6* 1.6*   No results for input(s): LIPASE, AMYLASE in the last 168 hours. No results for input(s): AMMONIA in the last 168 hours. CBC: Recent Labs  Lab 04/01/19 0521 04/02/19 0306 04/03/19 0245 04/04/19 0219 04/05/19 0225  WBC 12.4* 11.8* 21.4* 13.9* 11.2*  HGB 10.1* 9.8* 10.1* 10.0* 9.7*  HCT 32.1* 31.0* 32.0* 31.9* 30.4*  MCV 83.8 83.8 83.6 83.7 83.3  PLT 583* 810* 700* 674* 685*   Cardiac Enzymes: No results for input(s): CKTOTAL, CKMB, CKMBINDEX, TROPONINI in the last 168 hours. BNP: Invalid input(s): POCBNP CBG: Recent Labs  Lab 04/06/19 0603 04/06/19 1054 04/06/19 1659 04/06/19 2118 04/07/19 0558  GLUCAP 90 112* 79 110* 94   D-Dimer No results for input(s): DDIMER in the last 72 hours. Hgb A1c No results for input(s): HGBA1C in the last 72 hours. Lipid Profile No results for input(s): CHOL, HDL, LDLCALC, TRIG, CHOLHDL, LDLDIRECT in the  last 72 hours. Thyroid function studies No results for input(s): TSH, T4TOTAL, T3FREE, THYROIDAB in the last 72 hours.  Invalid input(s): FREET3 Anemia work up Recent Labs    04/05/19 0801  VITAMINB12 366  FOLATE 17.2  FERRITIN 104  TIBC 211*  IRON 23*  RETICCTPCT 2.2   Urinalysis    Component Value Date/Time   COLORURINE YELLOW 04/02/2019 0700   APPEARANCEUR CLEAR 04/02/2019 0700   LABSPEC 1.019 04/02/2019 0700   PHURINE 6.0 04/02/2019 0700   GLUCOSEU NEGATIVE 04/02/2019 0700   HGBUR NEGATIVE 04/02/2019 0700   BILIRUBINUR NEGATIVE 04/02/2019 0700   KETONESUR 5 (A) 04/02/2019 0700   PROTEINUR 30 (A) 04/02/2019 0700   NITRITE NEGATIVE 04/02/2019 0700   LEUKOCYTESUR TRACE (A) 04/02/2019 0700   Sepsis Labs Invalid input(s): PROCALCITONIN,  WBC,  LACTICIDVEN   Time coordinating discharge: 35 minutes  SIGNED:  Mercy Riding, MD  [facility] 04/07/2019, 10:50 AM  If 7PM-7AM, please contact night-coverage [email or web address] Password [email or web address]

## 2019-04-07 NOTE — Progress Notes (Signed)
Pt got discharged to home, discharge instructions provided and patient showed understanding to it, IV taken out,Telemonitor DC,pt left unit in wheelchair with all of the belongings accompanied with a family member (Husband) Eltha Tingley,RN  

## 2019-04-07 NOTE — Progress Notes (Addendum)
      [address]       [place] [address]             [phone]       5 Days Post-Op Procedure(s) (LRB): VIDEO BRONCHOSCOPY (N/A) VIDEO ASSISTED THORACOSCOPY (VATS)/DECORTICATION, Drainage of Emypema. (Left)  Subjective: Patient already had a bedside "bath" and she wants to go home.  Objective: Vital signs in last 24 hours: Temp:  [98.4 F (36.9 C)-98.9 F (37.2 C)] 98.5 F (36.9 C) (03/20 0723) Pulse Rate:  [81-93] 89 (03/20 0723) Cardiac Rhythm: Normal sinus rhythm (03/20 0700) Resp:  [15-29] 20 (03/20 0723) BP: (106-128)/(57-75) 115/66 (03/20 0723) SpO2:  [90 %-96 %] 92 % (03/20 0723)     Intake/Output from previous day: 03/19 0701 - 03/20 0700 In: 440 [P.O.:240; IV Piggyback:200] Out: -    Physical Exam:  Cardiovascular: RRR Pulmonary: Clear to auscultation on the right and diminished left base Abdomen: Soft, non tender, bowel sounds present. Extremities: No lower extremity edema. Wounds: All dressings removed and wounds are clean and dry.    Lab Results: CBC: Recent Labs    04/05/19 0225  WBC 11.2*  HGB 9.7*  HCT 30.4*  PLT 685*   BMET:  Recent Labs    04/05/19 0225  NA 140  K 3.8  CL 100  CO2 29  GLUCOSE 98  BUN 5*  CREATININE 0.60  CALCIUM 8.1*    PT/INR:  No results for input(s): LABPROT, INR in the last 72 hours. ABG:  INR: Will add last result for INR, ABG once components are confirmed Will add last 4 CBG results once components are confirmed  Assessment/Plan:  1. CV - SR with HR in the 80-90's this am.  2.  Pulmonary -  On room air. Continue Mucinex. Encourage incentive spirometer. 3. ID-On Augmentin. Left pleural fluid gram stain shows no organisms culture no growth thus far. 5. Expected ABL anemia-Last and H 9.7 and 30.4.  7. CBGs 79/110/94. HGA1C 6.6. On Metformin prior to admission, which patient states is for weight loss not diabetes.  Likely restart at discharge;per primary 8. Ok to discharge from  [employer]. I will send Ultram to patient pharmacy  [place] 04/07/2019,10:06 AM [phone]

## 2019-04-07 NOTE — Plan of Care (Signed)
  Problem: Education: Goal: Knowledge of General Education information will improve Description: Including pain rating scale, medication(s)/side effects and non-pharmacologic comfort measures Outcome: Completed/Met   Problem: Health Behavior/Discharge Planning: Goal: Ability to manage health-related needs will improve Outcome: Completed/Met   Problem: Clinical Measurements: Goal: Ability to maintain clinical measurements within normal limits will improve Outcome: Completed/Met Goal: Will remain free from infection Outcome: Completed/Met Goal: Diagnostic test results will improve Outcome: Completed/Met Goal: Respiratory complications will improve Outcome: Completed/Met Goal: Cardiovascular complication will be avoided Outcome: Completed/Met   Problem: Activity: Goal: Risk for activity intolerance will decrease Outcome: Completed/Met   Problem: Nutrition: Goal: Adequate nutrition will be maintained Outcome: Completed/Met   Problem: Coping: Goal: Level of anxiety will decrease Outcome: Completed/Met   Problem: Elimination: Goal: Will not experience complications related to bowel motility Outcome: Completed/Met Goal: Will not experience complications related to urinary retention Outcome: Completed/Met   Problem: Pain Managment: Goal: General experience of comfort will improve Outcome: Completed/Met   Problem: Safety: Goal: Ability to remain free from injury will improve Outcome: Completed/Met   Problem: Skin Integrity: Goal: Risk for impaired skin integrity will decrease Outcome: Completed/Met   Problem: Acute Rehab PT Goals(only PT should resolve) Goal: Pt Will Go Supine/Side To Sit Outcome: Completed/Met Goal: Patient Will Transfer Sit To/From Stand Outcome: Completed/Met Goal: Pt Will Ambulate Outcome: Completed/Met Goal: Pt Will Go Up/Down Stairs Outcome: Completed/Met Goal: Pt/caregiver will Perform Home Exercise Program Outcome: Completed/Met

## 2019-04-18 ENCOUNTER — Other Ambulatory Visit: Payer: Self-pay | Admitting: Cardiothoracic Surgery

## 2019-04-18 DIAGNOSIS — J90 Pleural effusion, not elsewhere classified: Secondary | ICD-10-CM

## 2019-04-19 ENCOUNTER — Ambulatory Visit
Admission: RE | Admit: 2019-04-19 | Discharge: 2019-04-19 | Disposition: A | Discharge status: Home / Self Care | Payer: Medicare Other | Source: Ambulatory Visit | Attending: Cardiothoracic Surgery | Admitting: Cardiothoracic Surgery

## 2019-04-19 ENCOUNTER — Other Ambulatory Visit: Payer: Self-pay

## 2019-04-19 ENCOUNTER — Ambulatory Visit (INDEPENDENT_AMBULATORY_CARE_PROVIDER_SITE_OTHER): Payer: Self-pay | Admitting: Cardiothoracic Surgery

## 2019-04-19 VITALS — BP 115/74 | HR 92 | Temp 97.2°F | Resp 20 | Ht 60.0 in | Wt 140.0 lb

## 2019-04-19 DIAGNOSIS — Z09 Encounter for follow-up examination after completed treatment for conditions other than malignant neoplasm: Secondary | ICD-10-CM

## 2019-04-19 DIAGNOSIS — J90 Pleural effusion, not elsewhere classified: Secondary | ICD-10-CM

## 2019-04-19 NOTE — Progress Notes (Signed)
[address]       [place] [address]             [phone]      Mckinsley [facility] [place] Medical Record #[id number] Date of Birth: 19-May-1950  Referring: Flora Lipps, MD Primary Care: Derinda Late, MD Primary Cardiologist: No primary care provider on file.   Chief Complaint:   POST OP FOLLOW UP OPERATIVE REPORT DATE OF PROCEDURE:  04/02/2019 PREOPERATIVE DIAGNOSIS:  Left lower lobe pneumonia with empyema. POSTOPERATIVE DIAGNOSIS:  Left lower lobe pneumonia with empyema. SURGICAL PROCEDURES:   1.  Video bronchoscopy. 2.  Left video-assisted thoracoscopy.  3.  Mini thoracotomy with evacuation of empyema and decortication.  History of Present Illness:     Patient returns to the office today for follow-up postop visit after drainage of left empyema associated with left lower lobe pneumonia.  This developed following upper GI endoscopy, possibly related to aspiration with her large hiatal hernia.   Since discharge home she denies any fever chills.  He still notes some chest wall discomfort on the left.  She will complete her course of antibiotics in the next day or 2.  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  She is tolerating this well     Past Medical History:  Diagnosis Date  . Allergy    seasonal  . Anemia   . Anxiety   . Asthma   . Depression   . Gallstones   . GERD (gastroesophageal reflux disease)   . Hearing loss   . High cholesterol   . Impaired glucose tolerance   . Internal hemorrhoids   . Osteopenia   . Overweight   . Urinary tract infection   . Vitamin D deficiency      Social History   Tobacco Use  Smoking Status Never Smoker  Smokeless Tobacco Never Used    Social History   Substance and Sexual Activity  Alcohol Use No  . Alcohol/week: 0.0 standard drinks     Allergies  Allergen Reactions  . Macrodantin     Chemical pneumonia   . Morphine And Related    hallucinations   . Sulfa Antibiotics Itching and Swelling    Current Outpatient Medications  Medication Sig Dispense Refill  . amoxicillin-clavulanate (AUGMENTIN) 875-125 MG tablet Take 1 tablet by mouth every 12 (twelve) hours. 30 tablet 0  . buPROPion (WELLBUTRIN XL) 300 MG 24 hr tablet Take 300 mg by mouth daily.      . Cholecalciferol (VITAMIN D) 2000 UNITS tablet Take 2,000 Units by mouth daily.    [name] Kitchen ezetimibe (ZETIA) 10 MG tablet Take 10 mg by mouth every evening.     . ferrous sulfate 325 (65 FE) MG tablet Take 1 tablet (325 mg total) by mouth 2 (two) times daily with a meal. 180 tablet 1  . FLUoxetine (PROZAC) 10 MG capsule Take 10 mg by mouth daily.      [name] Kitchen guaiFENesin (MUCINEX) 600 MG 12 hr tablet Take 1 tablet (600 mg total) by mouth 2 (two) times daily.    [name] Kitchen ketotifen (ZADITOR) 0.025 % ophthalmic solution Place 1 drop into both eyes daily as needed. For seasonal allergies     . metFORMIN (GLUCOPHAGE) 500 MG tablet Take 1 tablet (500 mg total) by mouth 2 (two) times daily with a meal. 180 tablet 1  . montelukast (SINGULAIR) 10 MG tablet Take 10 mg by mouth daily.     [name] Kitchen  omeprazole (PRILOSEC) 40 MG capsule Take 1 tablet 30 minutes before breakfast and 1 tablet 30 minutes before supper 60 capsule 11  . ondansetron (ZOFRAN-ODT) 4 MG disintegrating tablet Take 4 mg by mouth every 8 (eight) hours as needed for nausea or vomiting.     . rosuvastatin (CRESTOR) 40 MG tablet Take 40 mg by mouth daily.    [name] Kitchen saccharomyces boulardii (FLORASTOR) 250 MG capsule Take 1 capsule (250 mg total) by mouth 2 (two) times daily. 30 capsule 0  . senna-docusate (SENOKOT-S) 8.6-50 MG tablet Take 1 tablet by mouth 2 (two) times daily as needed for mild constipation or moderate constipation. 30 tablet 1  . traMADol (ULTRAM) 50 MG tablet Take 1 tablet (50 mg total) by mouth every 6 (six) hours as needed (mild pain). 28 tablet 0   No current facility-administered medications for this visit.       Physical  Exam: BP 115/74   Pulse 92   Temp (!) 97.2 F (36.2 C)   Resp 20   Ht 5' (1.524 m)   Wt 140 lb (63.5 kg)   SpO2 93% Comment: RA  BMI 27.34 kg/m   General appearance: alert, cooperative and no distress Neurologic: intact Heart: regular rate and rhythm, S1, S2 normal, no murmur, click, rub or gallop Lungs: diminished breath sounds LLL Abdomen: soft, non-tender; bowel sounds normal; no masses,  no organomegaly Extremities: extremities normal, atraumatic, no cyanosis or edema and Homans sign is negative, no sign of DVT Wound: Incisions and chest tube sites are healing without evidence of infection   Diagnostic Studies & Laboratory data:     Recent Radiology Findings:   DG Chest 2 View  Result Date: 04/19/2019 CLINICAL DATA:  Pleural effusion, vats 04/02/2019 EXAM: CHEST - 2 VIEW COMPARISON:  04/06/2019 FINDINGS: Persistent moderate left pleural effusion is slightly decreased. Associated left basilar atelectasis. Superimposed consolidation not excluded. Right lung remains clear. No pneumothorax. Similar cardiomediastinal contours. Hiatal hernia again noted. Chronic lower thoracic vertebral body compression deformity. IMPRESSION: Slightly decreased but persistent moderate left pleural effusion and left basilar atelectasis. Superimposed consolidation not excluded. Electronically Signed   By: Macy Mis M.D.   On: 04/19/2019 08:34    I have independently reviewed the above radiology studies  and reviewed the findings with the patient.    Recent Lab Findings: Lab Results  Component Value Date   WBC 11.2 (H) 04/05/2019   HGB 9.7 (L) 04/05/2019   HCT 30.4 (L) 04/05/2019   PLT 685 (H) 04/05/2019   GLUCOSE 98 04/05/2019   TRIG 72 03/28/2019   ALT 38 04/04/2019   AST 38 04/04/2019   NA 140 04/05/2019   K 3.8 04/05/2019   CL 100 04/05/2019   CREATININE 0.60 04/05/2019   BUN 5 (L) 04/05/2019   CO2 29 04/05/2019   INR 1.2 04/02/2019   HGBA1C 6.6 (H) 04/04/2019   Results for  orders placed or performed during the hospital encounter of 03/28/19  Blood Culture (routine x 2)     Status: None   Collection Time: 03/28/19 12:06 PM   Specimen: BLOOD  Result Value Ref Range Status   Specimen Description BLOOD RIGHT ANTECUBITAL  Final   Special Requests   Final    BOTTLES DRAWN AEROBIC AND ANAEROBIC Blood Culture adequate volume Performed at [facility], [address] [place], [place] [address]    Culture NO GROWTH 5 DAYS  Final   Report Status 04/02/2019 FINAL  Final  Blood Culture (routine  x 2)     Status: None   Collection Time: 03/28/19 12:06 PM   Specimen: BLOOD  Result Value Ref Range Status   Specimen Description BLOOD LEFT ANTECUBITAL  Final   Special Requests   Final    BOTTLES DRAWN AEROBIC AND ANAEROBIC Blood Culture adequate volume Performed at [facility], [address] [place], [place] [address]    Culture NO GROWTH 5 DAYS  Final   Report Status 04/02/2019 FINAL  Final  SARS Coronavirus 2 Ag (30 min TAT) - Nasal Swab (BD Veritor Kit)     Status: None   Collection Time: 03/28/19 12:06 PM   Specimen: Nasal Swab (BD Veritor Kit)  Result Value Ref Range Status   SARS Coronavirus 2 Ag NEGATIVE NEGATIVE Final    Comment: (NOTE) SARS-CoV-2 antigen NOT DETECTED.  Negative results are presumptive.  Negative results do not preclude SARS-CoV-2 infection and should not be used as the sole basis for treatment or other patient management decisions, including infection  control decisions, particularly in the presence of clinical signs and  symptoms consistent with COVID-19, or in those who have been in contact with the virus.  Negative results must be combined with clinical observations, patient history, and epidemiological information. The expected result is Negative. Fact Sheet for Patients: PodPark.tn Fact Sheet for Healthcare Providers: GiftContent.is This test is not  yet approved or cleared by the [place] FDA and  has been authorized for detection and/or diagnosis of SARS-CoV-2 by FDA under an Emergency Use Authorization (EUA).  This EUA will remain in effect (meaning this test can be used) for the duration of  the COVID-19 de claration under Section 564(b)(1) of the Act, 21 U.S.C. section 360bbb-3(b)(1), unless the authorization is terminated or revoked sooner. Performed at [facility], [address] [place], [place] [address]   Respiratory Panel by RT PCR (Flu A&B, Covid) - Nasopharyngeal Swab     Status: None   Collection Time: 03/28/19  1:17 PM   Specimen: Nasopharyngeal Swab  Result Value Ref Range Status   SARS Coronavirus 2 by RT PCR NEGATIVE NEGATIVE Final    Comment: (NOTE) SARS-CoV-2 target nucleic acids are NOT DETECTED. The SARS-CoV-2 RNA is generally detectable in upper respiratoy specimens during the acute phase of infection. The lowest concentration of SARS-CoV-2 viral copies this assay can detect is 131 copies/mL. A negative result does not preclude SARS-Cov-2 infection and should not be used as the sole basis for treatment or other patient management decisions. A negative result may occur with  improper specimen collection/handling, submission of specimen other than nasopharyngeal swab, presence of viral mutation(s) within the areas targeted by this assay, and inadequate number of viral copies (<131 copies/mL). A negative result must be combined with clinical observations, patient history, and epidemiological information. The expected result is Negative. Fact Sheet for Patients:  PinkCheek.be Fact Sheet for Healthcare Providers:  GravelBags.it This test is not yet ap proved or cleared by the [place] FDA and  has been authorized for detection and/or diagnosis of SARS-CoV-2 by FDA under an Emergency Use Authorization (EUA). This EUA will remain    in effect (meaning this test can be used) for the duration of the COVID-19 declaration under Section 564(b)(1) of the Act, 21 U.S.C. section 360bbb-3(b)(1), unless the authorization is terminated or revoked sooner.    Influenza A by PCR NEGATIVE NEGATIVE Final   Influenza B by PCR NEGATIVE NEGATIVE Final    Comment: (NOTE) The  Xpert Xpress SARS-CoV-2/FLU/RSV assay is intended as an aid in  the diagnosis of influenza from Nasopharyngeal swab specimens and  should not be used as a sole basis for treatment. Nasal washings and  aspirates are unacceptable for Xpert Xpress SARS-CoV-2/FLU/RSV  testing. Fact Sheet for Patients: PinkCheek.be Fact Sheet for Healthcare Providers: GravelBags.it This test is not yet approved or cleared by the [place] FDA and  has been authorized for detection and/or diagnosis of SARS-CoV-2 by  FDA under an Emergency Use Authorization (EUA). This EUA will remain  in effect (meaning this test can be used) for the duration of the  Covid-19 declaration under Section 564(b)(1) of the Act, 21  U.S.C. section 360bbb-3(b)(1), unless the authorization is  terminated or revoked. Performed at [facility], [address] [place], [place] [address]   Fungus Culture With Stain     Status: None (Preliminary result)   Collection Time: 03/29/19  3:40 PM   Specimen: PATH Cytology Pleural fluid  Result Value Ref Range Status   Fungus Stain Final report  Final    Comment: (NOTE) Performed At: [facility] [place], [place] [id number] Rush Farmer MD [id number]    Fungus (Mycology) Culture PENDING  Incomplete   Fungal Source PLEURAL  Final    Comment: Performed at [facility], [place] [address] [place], [place] [address]  Culture, body fluid-bottle     Status: None   Collection Time: 03/29/19  3:40 PM   Specimen: Pleura  Result Value Ref Range Status    Specimen Description PLEURAL  Final   Special Requests NONE  Final   Culture   Final    NO GROWTH 5 DAYS Performed at [facility], [address] [place], [place] [address]    Report Status 04/03/2019 FINAL  Final  Gram stain     Status: None   Collection Time: 03/29/19  3:40 PM   Specimen: Pleura  Result Value Ref Range Status   Specimen Description PLEURAL  Final   Special Requests NONE  Final   Gram Stain   Final    MODERATE WBC PRESENT, PREDOMINANTLY PMN NO ORGANISMS SEEN Performed at [facility], [place] [address] [place], [place] [address]    Report Status 03/29/2019 FINAL  Final  Fungus Culture Result     Status: None   Collection Time: 03/29/19  3:40 PM  Result Value Ref Range Status   Result 1 Comment  Final    Comment: (NOTE) KOH/Calcofluor preparation:  no fungus observed. Performed At: [facility] [place], [place] [id number] Rush Farmer MD JH:[id number]   Body fluid culture     Status: Abnormal   Collection Time: 04/02/19  1:02 PM   Specimen: Pleural, Left; Body Fluid  Result Value Ref Range Status   Specimen Description PLEURAL LEFT  Final   Special Requests NONE  Final   Gram Stain   Final    FEW WBC PRESENT, PREDOMINANTLY PMN RARE GRAM POSITIVE COCCI Performed at [facility], [address] [place],  [address]    Culture STREPTOCOCCUS INTERMEDIUS (A)  Final   Report Status 04/06/2019 FINAL  Final   Organism ID, Bacteria STREPTOCOCCUS INTERMEDIUS  Final      Susceptibility   Streptococcus intermedius - MIC*    PENICILLIN <=0.06 SENSITIVE Sensitive     CEFTRIAXONE <=0.12 SENSITIVE Sensitive     ERYTHROMYCIN 2 RESISTANT Resistant     LEVOFLOXACIN 0.5  SENSITIVE Sensitive     VANCOMYCIN 0.25 SENSITIVE Sensitive     * STREPTOCOCCUS INTERMEDIUS  Fungus Culture With Stain     Status: None (Preliminary result)   Collection Time: 04/02/19  1:02 PM   Specimen: Pleural, Left; Body Fluid  Result Value Ref  Range Status   Fungus Stain Final report  Final    Comment: (NOTE) Performed At: [facility] [place], [place] [id number] Rush Farmer MD RK:[id number]    Fungus (Mycology) Culture PENDING  Incomplete   Fungal Source PLEURAL  Final    Comment: LEFT Performed at [facility], [place] [address], [place], [place] [address]   Acid Fast Smear (AFB)     Status: None   Collection Time: 04/02/19  1:02 PM   Specimen: Pleural, Left; Body Fluid  Result Value Ref Range Status   AFB Specimen Processing Concentration  Final   Acid Fast Smear Negative  Final    Comment: (NOTE) Performed At: [facility] [place], [place] [id number] Rush Farmer MD [id number]    Source (AFB) PLEURAL  Final    Comment: LEFT Performed at [facility], [place] [address], [place], [place] [address]   Fungus Culture Result     Status: None   Collection Time: 04/02/19  1:02 PM  Result Value Ref Range Status   Result 1 Comment  Final    Comment: (NOTE) KOH/Calcofluor preparation:  no fungus observed. Performed At: [facility] [place], [place] [id number] Rush Farmer MD [id number]   Fungus Culture With Stain     Status: None (Preliminary result)   Collection Time: 04/02/19  1:04 PM   Specimen: Bronchial Washing, Left; Respiratory  Result Value Ref Range Status   Fungus Stain Final report  Final    Comment: (NOTE) Performed At: [facility] [place], [place] [id number] Rush Farmer MD [id number]    Fungus (Mycology) Culture PENDING  Incomplete   Fungal Source BRONCHIAL ALVEOLAR LAVAGE  Final    Comment: LEFT Performed at [facility], [place] [address], [place], [place] [address]   Culture, respiratory     Status: None   Collection Time: 04/02/19  1:04 PM   Specimen: Bronchial Washing, Left; Respiratory  Result Value Ref Range Status   Specimen Description BRONCHIAL ALVEOLAR LAVAGE LEFT   Final   Special Requests PT ON ZITHROMAX ZOSYN VANC  Final   Gram Stain   Final    FEW WBC PRESENT, PREDOMINANTLY PMN NO ORGANISMS SEEN    Culture   Final    NO GROWTH 2 DAYS Performed at [facility], [place] [address] [place], [place] [address]    Report Status 04/04/2019 FINAL  Final  Acid Fast Smear (AFB)     Status: None   Collection Time: 04/02/19  1:04 PM   Specimen: Bronchial Washing, Left; Respiratory  Result Value Ref Range Status   AFB Specimen Processing Concentration  Final   Acid Fast Smear Negative  Final    Comment: (NOTE) Performed At: [facility] [place], [place] [id number] Rush Farmer MD ZT:[id number]    Source (AFB) BRONCHIAL ALVEOLAR LAVAGE  Final    Comment: LEFT Performed at [facility], [place] [address], [place], [place] [address]   Fungus Culture Result     Status: None   Collection Time: 04/02/19  1:04 PM  Result Value Ref Range Status   Result 1 Comment  Final    Comment: (NOTE) KOH/Calcofluor preparation:  no fungus observed. Performed At: [facility] [place], [place] [id number] Rush Farmer MD [id number]   Fungus Culture With Stain     Status: None (Preliminary result)   Collection Time: 04/02/19  1:38 PM   Specimen: Pleural, Left; Tissue  Result Value Ref Range Status   Fungus Stain Final report  Final    Comment: (NOTE) Performed At: [facility] [place], [place] [id number] Rush Farmer MD VF:[id number]    Fungus (Mycology) Culture PENDING  Incomplete   Fungal Source TISSUE  Final    Comment: PLEURAL LEFT Performed at [facility], [place] [address] [place], [place] [address]   Aerobic/Anaerobic Culture (surgical/deep wound)     Status: None   Collection Time: 04/02/19  1:38 PM   Specimen: Pleural, Left; Tissue  Result Value Ref Range Status   Specimen Description TISSUE PLEURAL LEFT  Final   Special Requests SPEC C  Final   Gram Stain    Final    RARE WBC PRESENT, PREDOMINANTLY PMN NO ORGANISMS SEEN    Culture   Final    No growth aerobically or anaerobically. Performed at [facility], [place] [address] [place], [place] [address]    Report Status 04/07/2019 FINAL  Final  Acid Fast Smear (AFB)     Status: None   Collection Time: 04/02/19  1:38 PM   Specimen: Pleural, Left; Tissue  Result Value Ref Range Status   AFB Specimen Processing Comment  Final    Comment: Tissue Grinding and Digestion/Decontamination   Acid Fast Smear Negative  Final    Comment: (NOTE) Performed At: [facility] [place], [place] [id number] Rush Farmer MD DD:[id number]    Source (AFB) TISSUE  Final    Comment: PLEURAL LEFT Performed at [facility], [place] [address] [place], [place] [address]   Fungus Culture Result     Status: None   Collection Time: 04/02/19  1:38 PM  Result Value Ref Range Status   Result 1 Comment  Final    Comment: (NOTE) KOH/Calcofluor preparation:  no fungus observed. Performed At: [facility] [place], [place] [id number] Rush Farmer MD [id number]   MRSA PCR Screening     Status: None   Collection Time: 04/02/19  6:20 PM   Specimen: Nasal Mucosa; Nasopharyngeal  Result Value Ref Range Status   MRSA by PCR NEGATIVE NEGATIVE Final    Comment:        The GeneXpert MRSA Assay (FDA approved for NASAL specimens only), is one component of a comprehensive MRSA colonization surveillance program. It is not intended to diagnose MRSA infection nor to guide or monitor treatment for MRSA infections. Performed at [facility], [place] [address], [place], [place] [address]      Assessment / Plan:   #1 status post left lower lobe pneumonia and associated empyema-surgically drained-completing course of antibiotics with improving postop chest x-ray and symptomatically improving without recurrent fever chills or evidence of infection. *  STREPTOCOCCUS INTERMEDIUS was cultured from the left pleural fluid #2 large hiatal hernia with significant portion of the stomach intrathoracic  Overall the patient is making good progress she was encouraged to continue to increase her activity as she tolerated.  We did talk in general terms about surgical options for large hiatal hernias.   We will plan to see the patient back in 3 to 4 weeks  with a follow-up chest x-ray   Medication Changes: No orders of the defined types were placed in this encounter.     [name] MD      [address] [place] [address] Office [phone]     04/19/2019 9:13 AM

## 2019-04-19 NOTE — Progress Notes (Signed)
Pt has three incisions to L back/lateral chest that are well-approximated and without s/s of infection. Four sutures are easily removed per standard protocol. Instructed pt to keep sites clean (w/ mild soap and water) and dry. To notify office of any s/s of infection or other problems.

## 2019-04-27 LAB — FUNGUS CULTURE WITH STAIN

## 2019-04-27 LAB — FUNGAL ORGANISM REFLEX

## 2019-04-27 LAB — FUNGUS CULTURE RESULT

## 2019-05-02 LAB — FUNGUS CULTURE WITH STAIN

## 2019-05-02 LAB — FUNGUS CULTURE RESULT

## 2019-05-02 LAB — FUNGAL ORGANISM REFLEX

## 2019-05-03 LAB — FUNGUS CULTURE WITH STAIN

## 2019-05-03 LAB — FUNGAL ORGANISM REFLEX

## 2019-05-03 LAB — FUNGUS CULTURE RESULT

## 2019-05-10 LAB — ACID FAST CULTURE WITH REFLEXED SENSITIVITIES (MYCOBACTERIA): Acid Fast Culture: NEGATIVE

## 2019-05-16 LAB — ACID FAST CULTURE WITH REFLEXED SENSITIVITIES (MYCOBACTERIA)
Acid Fast Culture: NEGATIVE
Acid Fast Culture: NEGATIVE

## 2019-05-23 ENCOUNTER — Other Ambulatory Visit: Payer: Self-pay | Admitting: Cardiothoracic Surgery

## 2019-05-23 DIAGNOSIS — J90 Pleural effusion, not elsewhere classified: Secondary | ICD-10-CM

## 2019-05-23 NOTE — Progress Notes (Signed)
[address]       [place] [address]             [phone]      Brooke Ellis [place] Medical Record #[id number] Date of Birth: 1950/06/17  Referring: Flora Lipps, MD Primary Care: Derinda Late, MD Primary Cardiologist: No primary care provider on file.   Chief Complaint:   POST OP FOLLOW UP OPERATIVE REPORT DATE OF PROCEDURE:  04/02/2019 PREOPERATIVE DIAGNOSIS:  Left lower lobe pneumonia with empyema. POSTOPERATIVE DIAGNOSIS:  Left lower lobe pneumonia with empyema. SURGICAL PROCEDURES:   1.  Video bronchoscopy. 2.  Left video-assisted thoracoscopy.  3.  Mini thoracotomy with evacuation of empyema and decortication.  History of Present Illness:     Patient returns to the office today for follow-up postop visit after drainage of left empyema associated with left lower lobe pneumonia.  This developed following upper GI endoscopy, possibly related to aspiration with her large hiatal hernia.   Since last seen the patient has continued to have made good progress, she is return to work without difficulty.  She does note that if she does not take Protonix 40 mg twice a day she has reflux symptoms.  She has confirmed short segment Barrett's     Past Medical History:  Diagnosis Date  . Allergy    seasonal  . Anemia   . Anxiety   . Asthma   . Depression   . Gallstones   . GERD (gastroesophageal reflux disease)   . Hearing loss   . High cholesterol   . Impaired glucose tolerance   . Internal hemorrhoids   . Osteopenia   . Overweight   . Urinary tract infection   . Vitamin D deficiency      Social History   Tobacco Use  Smoking Status Never Smoker  Smokeless Tobacco Never Used    Social History   Substance and Sexual Activity  Alcohol Use No  . Alcohol/week: 0.0 standard drinks     Allergies  Allergen Reactions  . Macrodantin     Chemical pneumonia   . Morphine And Related     hallucinations   . Sulfa Antibiotics  Itching and Swelling    Current Outpatient Medications  Medication Sig Dispense Refill  . buPROPion (WELLBUTRIN XL) 300 MG 24 hr tablet Take 300 mg by mouth daily.      . Cholecalciferol (VITAMIN D) 2000 UNITS tablet Take 2,000 Units by mouth daily.    [name] Kitchen ezetimibe (ZETIA) 10 MG tablet Take 10 mg by mouth every evening.     . ferrous sulfate 325 (65 FE) MG tablet Take 1 tablet (325 mg total) by mouth 2 (two) times daily with a meal. 180 tablet 1  . FLUoxetine (PROZAC) 10 MG capsule Take 10 mg by mouth daily.      [name] Kitchen guaiFENesin (MUCINEX) 600 MG 12 hr tablet Take 1 tablet (600 mg total) by mouth 2 (two) times daily.    [name] Kitchen ketotifen (ZADITOR) 0.025 % ophthalmic solution Place 1 drop into both eyes daily as needed. For seasonal allergies     . metFORMIN (GLUCOPHAGE) 500 MG tablet Take 1 tablet (500 mg total) by mouth 2 (two) times daily with a meal. 180 tablet 1  . montelukast (SINGULAIR) 10 MG tablet Take 10 mg by mouth daily.     [name] Kitchen omeprazole (PRILOSEC) 40 MG capsule Take 1 tablet 30 minutes before breakfast and 1 tablet 30 minutes before supper 60 capsule 11  .  ondansetron (ZOFRAN-ODT) 4 MG disintegrating tablet Take 4 mg by mouth every 8 (eight) hours as needed for nausea or vomiting.     . rosuvastatin (CRESTOR) 40 MG tablet Take 40 mg by mouth daily.    [name] Kitchen saccharomyces boulardii (FLORASTOR) 250 MG capsule Take 1 capsule (250 mg total) by mouth 2 (two) times daily. 30 capsule 0  . senna-docusate (SENOKOT-S) 8.6-50 MG tablet Take 1 tablet by mouth 2 (two) times daily as needed for mild constipation or moderate constipation. 30 tablet 1  . amoxicillin-clavulanate (AUGMENTIN) 875-125 MG tablet Take 1 tablet by mouth every 12 (twelve) hours. (Patient not taking: Reported on 05/24/2019) 30 tablet 0   No current facility-administered medications for this visit.       Physical Exam: BP (!) 129/96   Pulse 83   Temp 97.9 F (36.6 C) (Skin)   Resp 20   Ht 5' (1.524 m)   Wt 136 lb (61.7 kg)    SpO2 97% Comment: RA  BMI 26.56 kg/m   General appearance: alert and cooperative Head: Normocephalic, without obvious abnormality, atraumatic Neck: no adenopathy, no carotid bruit, no JVD, supple, symmetrical, trachea midline and thyroid not enlarged, symmetric, no tenderness/mass/nodules Cardio: regular rate and rhythm, S1, S2 normal, no murmur, click, rub or gallop GI: soft, non-tender; bowel sounds normal; no masses,  no organomegaly Extremities: extremities normal, atraumatic, no cyanosis or edema and Homans sign is negative, no sign of DVT Neurologic: Grossly normal Wound: Chest tube sites and incision are well-healed left chest  Diagnostic Studies & Laboratory data:     Recent Radiology Findings:   DG Chest 2 View  Result Date: 05/24/2019 CLINICAL DATA:  Pleural effusion. EXAM: CHEST - 2 VIEW COMPARISON:  04/19/2019. FINDINGS: Mediastinum and hilar structures normal. Heart size normal. Persistent but improved left sided pleural effusion. Mild bibasilar atelectasis. No pneumothorax. Sliding hiatal hernia again noted. Surgical clips right upper quadrant. Degenerative changes thoracolumbar spine. Stable lower thoracic vertebral body compression fracture. IMPRESSION: Persistent but improved left-sided pleural effusion. Mild bibasilar atelectasis. Electronically Signed   By: Marcello Moores  Register   On: 05/24/2019 08:42    I have independently reviewed the above radiology studies  and reviewed the findings with the patient.    Recent Lab Findings: Lab Results  Component Value Date   WBC 11.2 (H) 04/05/2019   HGB 9.7 (L) 04/05/2019   HCT 30.4 (L) 04/05/2019   PLT 685 (H) 04/05/2019   GLUCOSE 98 04/05/2019   TRIG 72 03/28/2019   ALT 38 04/04/2019   AST 38 04/04/2019   NA 140 04/05/2019   K 3.8 04/05/2019   CL 100 04/05/2019   CREATININE 0.60 04/05/2019   BUN 5 (L) 04/05/2019   CO2 29 04/05/2019   INR 1.2 04/02/2019   HGBA1C 6.6 (H) 04/04/2019   ENDO FEB 2021 Dr Henrene Pastor: There were  esophageal mucosal changes secondary to established short-segment Barrett's disease present in the lower third of the esophagus. The maximum longitudinal extent of these mucosal changes was 2 cm in length (C2, M2). There was no active inflammation, nodularity, or other irregularity. The mucosa was biopsied with a cold forceps for histology. Findings: - The stomach was normal except for a large hiatal hernia. - The examined duodenum was normal. - The cardia and gastric fundus were normal on retroflexion Esophageal mucosal changes secondary to established short-segment Barrett's disease. Biopsied. - Normal stomach with large hiatal hernia. - Normal examined duodenum. PATH 2/21 endo and colooscopy Diagnosis 1. Surgical [P], ascending, polyps (  3) - TUBULAR ADENOMA (2 FRAGMENTS) - HYPERPLASTIC POLYP (1 FRAGMENTS) - NO HIGH GRADE DYSPLASIA OR MALIGNANCY IDENTIFIED 2. Surgical [P], transverse, sig, polyps (3) - TUBULAR ADENOMA (4 FRAGMENTS) - MULTIPLE FRAGMENTS OF BENIGN COLONIC MUCOSA - NO HIGH GRADE DYSPLASIA OR MALIGNANCY IDENTIFIED 3. Surgical [P], distal esophagus - SQUAMOUS AND GLANDULAR EPITHELIUM WITH ACUTE AND CHRONIC INFLAMMATION - INTESTINAL METAPLASIA PRESENT - NO DYSPLASIA OR MALIGNANCY IDENTIFIED - SEE COMMENT Microscopic Comment 3. With the proper endoscopic findings this is diagnostic of Barrett esophagus. Thressa Sheller MD Pathologist, Electronic Signature (Case signed 03/08/2019)   Assessment / Plan:   #1 status post left lower lobe pneumonia and associated empyema-surgically drained-completing course of antibiotics with continued improvement of postop chest x-ray .  Patient is without  recurrent fever chills or evidence of infection. * STREPTOCOCCUS INTERMEDIUS was cultured from the left pleural fluid  #2 large hiatal hernia with significant portion of the stomach intrathoracic-with symptomatic reflux and short segment Barrett's  Patient's preop CT scan suggested  adenopathy-prominent AP window and left hilar nodes most likely reactive-with the largest hiatal hernia and need for follow-up on nodes in 3 to 4 weeks will obtain a CT of the chest with and without oral contrast.     Medication Changes: No orders of the defined types were placed in this encounter.     Brooke Isaac MD      [address] [address] Office [phone]     05/24/2019 10:17 AM

## 2019-05-24 ENCOUNTER — Ambulatory Visit (INDEPENDENT_AMBULATORY_CARE_PROVIDER_SITE_OTHER): Payer: Self-pay | Admitting: Cardiothoracic Surgery

## 2019-05-24 ENCOUNTER — Other Ambulatory Visit: Payer: Self-pay

## 2019-05-24 ENCOUNTER — Other Ambulatory Visit: Payer: Self-pay | Admitting: Cardiothoracic Surgery

## 2019-05-24 ENCOUNTER — Ambulatory Visit
Admission: RE | Admit: 2019-05-24 | Discharge: 2019-05-24 | Disposition: A | Discharge status: Home / Self Care | Payer: Medicare Other | Source: Ambulatory Visit | Attending: Cardiothoracic Surgery | Admitting: Cardiothoracic Surgery

## 2019-05-24 VITALS — BP 129/96 | HR 83 | Temp 97.9°F | Resp 20 | Ht 60.0 in | Wt 136.0 lb

## 2019-05-24 DIAGNOSIS — K219 Gastro-esophageal reflux disease without esophagitis: Secondary | ICD-10-CM

## 2019-05-24 DIAGNOSIS — J90 Pleural effusion, not elsewhere classified: Secondary | ICD-10-CM

## 2019-05-24 DIAGNOSIS — R591 Generalized enlarged lymph nodes: Secondary | ICD-10-CM

## 2019-05-24 DIAGNOSIS — R599 Enlarged lymph nodes, unspecified: Secondary | ICD-10-CM

## 2019-05-24 DIAGNOSIS — Z09 Encounter for follow-up examination after completed treatment for conditions other than malignant neoplasm: Secondary | ICD-10-CM

## 2019-05-24 DIAGNOSIS — K449 Diaphragmatic hernia without obstruction or gangrene: Secondary | ICD-10-CM

## 2019-06-21 ENCOUNTER — Other Ambulatory Visit: Payer: Medicare Other

## 2019-06-21 ENCOUNTER — Ambulatory Visit (INDEPENDENT_AMBULATORY_CARE_PROVIDER_SITE_OTHER): Payer: Self-pay | Admitting: Cardiothoracic Surgery

## 2019-06-21 ENCOUNTER — Ambulatory Visit
Admission: RE | Admit: 2019-06-21 | Discharge: 2019-06-21 | Disposition: A | Discharge status: Home / Self Care | Payer: Medicare Other | Source: Ambulatory Visit | Attending: Cardiothoracic Surgery | Admitting: Cardiothoracic Surgery

## 2019-06-21 ENCOUNTER — Other Ambulatory Visit: Payer: Self-pay

## 2019-06-21 VITALS — BP 128/80 | HR 88 | Temp 98.1°F | Resp 20 | Ht 60.0 in | Wt 136.0 lb

## 2019-06-21 DIAGNOSIS — J90 Pleural effusion, not elsewhere classified: Secondary | ICD-10-CM

## 2019-06-21 DIAGNOSIS — K449 Diaphragmatic hernia without obstruction or gangrene: Secondary | ICD-10-CM

## 2019-06-21 DIAGNOSIS — R591 Generalized enlarged lymph nodes: Secondary | ICD-10-CM

## 2019-06-21 DIAGNOSIS — K219 Gastro-esophageal reflux disease without esophagitis: Secondary | ICD-10-CM

## 2019-06-21 DIAGNOSIS — R599 Enlarged lymph nodes, unspecified: Secondary | ICD-10-CM

## 2019-06-21 NOTE — Progress Notes (Signed)
[address]       [place] [address]             [phone]      Brooke Ellis [place] Medical Record #[id number] Date of Birth: 22-Jan-1950  Referring: Brooke Lipps, MD Primary Care: Brooke Late, MD Primary Cardiologist: No primary care provider on file.   Chief Complaint:   POST OP FOLLOW UP OPERATIVE REPORT DATE OF PROCEDURE:  04/02/2019 PREOPERATIVE DIAGNOSIS:  Left lower lobe pneumonia with empyema. POSTOPERATIVE DIAGNOSIS:  Left lower lobe pneumonia with empyema. SURGICAL PROCEDURES:   1.  Video bronchoscopy. 2.  Left video-assisted thoracoscopy.  3.  Mini thoracotomy with evacuation of empyema and decortication.  History of Present Illness:     Patient returns to the office today for follow-up postop visit after drainage of left empyema associated with left lower lobe pneumonia.  This developed following upper GI endoscopy, possibly related to aspiration with her large hiatal hernia.   Patient returns today with a follow-up CT scan of the chest and abdomen, made better question of mediastinal adenopathy on her preprocedure CT scan-this is resolved and was likely related to chest infection.  She does note that if she does not take Protonix 40 mg twice a day she has reflux symptoms.  She has confirmed short segment Barrett's     Past Medical History:  Diagnosis Date  . Allergy    seasonal  . Anemia   . Anxiety   . Asthma   . Depression   . Gallstones   . GERD (gastroesophageal reflux disease)   . Hearing loss   . High cholesterol   . Impaired glucose tolerance   . Internal hemorrhoids   . Osteopenia   . Overweight   . Urinary tract infection   . Vitamin D deficiency      Social History   Tobacco Use  Smoking Status Never Smoker  Smokeless Tobacco Never Used    Social History   Substance and Sexual Activity  Alcohol Use No  . Alcohol/week: 0.0 standard drinks     Allergies  Allergen Reactions  .  Macrodantin     Chemical pneumonia   . Morphine And Related     hallucinations   . Sulfa Antibiotics Itching and Swelling    Current Outpatient Medications  Medication Sig Dispense Refill  . buPROPion (WELLBUTRIN XL) 300 MG 24 hr tablet Take 300 mg by mouth daily.      . Cholecalciferol (VITAMIN D) 2000 UNITS tablet Take 2,000 Units by mouth daily.    [name] Kitchen ezetimibe (ZETIA) 10 MG tablet Take 10 mg by mouth every evening.     . ferrous sulfate 325 (65 FE) MG tablet Take 1 tablet (325 mg total) by mouth 2 (two) times daily with a meal. 180 tablet 1  . FLUoxetine (PROZAC) 10 MG capsule Take 10 mg by mouth daily.      [name] Kitchen guaiFENesin (MUCINEX) 600 MG 12 hr tablet Take 1 tablet (600 mg total) by mouth 2 (two) times daily.    [name] Kitchen ketotifen (ZADITOR) 0.025 % ophthalmic solution Place 1 drop into both eyes daily as needed. For seasonal allergies     . montelukast (SINGULAIR) 10 MG tablet Take 10 mg by mouth daily.     [name] Kitchen omeprazole (PRILOSEC) 40 MG capsule Take 1 tablet 30 minutes before breakfast and 1 tablet 30 minutes before supper 60 capsule 11  . ondansetron (ZOFRAN-ODT) 4 MG disintegrating tablet Take 4 mg by  mouth every 8 (eight) hours as needed for nausea or vomiting.     . rosuvastatin (CRESTOR) 40 MG tablet Take 40 mg by mouth daily.     No current facility-administered medications for this visit.       Physical Exam: BP 128/80   Pulse 88   Temp 98.1 F (36.7 C) (Skin)   Resp 20   Ht 5' (1.524 m)   Wt 136 lb (61.7 kg)   SpO2 96% Comment: RA  BMI 26.56 kg/m  General appearance: alert and cooperative Head: Normocephalic, without obvious abnormality, atraumatic Lymph nodes: Cervical, supraclavicular, and axillary nodes normal. Resp: clear to auscultation bilaterally Back: symmetric, no curvature. ROM normal. No CVA tenderness. Cardio: regular rate and rhythm, S1, S2 normal, no murmur, click, rub or gallop Extremities: extremities normal, atraumatic, no cyanosis or  edema Neurologic: Grossly normal   Diagnostic Studies & Laboratory data:     Recent Radiology Findings:   CT ABDOMEN WO CONTRAST/  CT Chest Wo Contrast  Result Date: 06/21/2019 CLINICAL DATA:  Follow-up thoracic adenopathy after recent pneumonia. History of large hiatal hernia and abnormal chest radiograph. EXAM: CT CHEST AND ABDOMEN WITHOUT CONTRAST TECHNIQUE: Multidetector CT imaging of the chest and abdomen was performed following the standard protocol without intravenous contrast. COMPARISON:  05/24/2019 chest radiograph. 03/28/2019 chest CT angiogram. FINDINGS: CT CHEST FINDINGS WITHOUT CONTRAST Cardiovascular: Normal heart size. No significant pericardial effusion/thickening. Great vessels are normal in course and caliber. Mediastinum/Nodes: No discrete thyroid nodules. Mildly patulous thoracic esophagus with layering debris. No axillary adenopathy. No pathologically enlarged mediastinal or discrete hilar nodes on this noncontrast scan. Previously described mild AP window and left hilar adenopathy appears resolved. Lungs/Pleura: No pneumothorax. No residual pleural effusions. Residual mild smooth basilar left pleural thickening. A few scattered solid pulmonary nodules in the right greater than left lungs, largest 4 mm in the peripheral right middle lobe (series 4/image 86), obscured by atelectasis on the prior chest CT angiogram study. Thick subpleural parenchymal bands in peripheral lingula and left lower lobe, compatible with pleural-parenchymal scarring. No acute consolidative airspace disease or lung masses. Musculoskeletal: No aggressive appearing focal osseous lesions. Chronic stable severe T11 vertebral compression fracture. CT ABDOMEN FINDINGS WITHOUT CONTRAST Hepatobiliary: Normal liver with no liver mass. Cholecystectomy. No biliary ductal dilatation. Pancreas: Normal, with no mass or duct dilation. Spleen: Normal size. No mass. Adrenals/Urinary Tract: Normal adrenals. No renal stones. No  hydronephrosis. No contour deforming renal masses. Stomach/Bowel: Moderate to large hiatal hernia. Stomach is nondistended and otherwise grossly normal. Visualized small and large bowel is normal caliber, with no bowel wall thickening. Vascular/Lymphatic: Normal caliber abdominal aorta. No pathologically enlarged lymph nodes in the abdomen. Other: No pneumoperitoneum, ascites or focal fluid collection. Tiny fat containing umbilical hernia. Musculoskeletal: No aggressive appearing focal osseous lesions. Moderate lumbar spondylosis. IMPRESSION: 1. Resolved thoracic adenopathy. 2. Resolved left lung pneumonia with residual thick pleural-parenchymal scarring at the left lung base. No residual pleural effusions. 3. Moderate to large hiatal hernia. Mildly patulous thoracic esophagus with layering debris, suggesting chronic esophageal dysmotility. 4. Scattered small solid pulmonary nodules, largest 4 mm, obscured by atelectasis on the prior chest CT angiogram study. No follow-up needed if patient is low-risk (and has no known or suspected primary neoplasm). Non-contrast chest CT can be considered in 12 months if patient is high-risk. This recommendation follows the consensus statement: Guidelines for Management of Incidental Pulmonary Nodules Detected on CT Images: From the Fleischner Society 2017; Radiology 2017; 284:228-243. 5. Chronic stable severe T11 vertebral  compression fracture. Electronically Signed   By: Brooke Ellis M.D.   On: 06/21/2019 10:05     I have independently reviewed the above radiology studies  and reviewed the findings with the patient.    Recent Lab Findings: Lab Results  Component Value Date   WBC 11.2 (H) 04/05/2019   HGB 9.7 (L) 04/05/2019   HCT 30.4 (L) 04/05/2019   PLT 685 (H) 04/05/2019   GLUCOSE 98 04/05/2019   TRIG 72 03/28/2019   ALT 38 04/04/2019   AST 38 04/04/2019   NA 140 04/05/2019   K 3.8 04/05/2019   CL 100 04/05/2019   CREATININE 0.60 04/05/2019   BUN 5 (L)  04/05/2019   CO2 29 04/05/2019   INR 1.2 04/02/2019   HGBA1C 6.6 (H) 04/04/2019   ENDO FEB 2021 Brooke Ellis: There were esophageal mucosal changes secondary to established short-segment Barrett's disease present in the lower third of the esophagus. The maximum longitudinal extent of these mucosal changes was 2 cm in length (C2, M2). There was no active inflammation, nodularity, or other irregularity. The mucosa was biopsied with a cold forceps for histology. Findings: - The stomach was normal except for a large hiatal hernia. - The examined duodenum was normal. - The cardia and gastric fundus were normal on retroflexion Esophageal mucosal changes secondary to established short-segment Barrett's disease. Biopsied. - Normal stomach with large hiatal hernia. - Normal examined duodenum. PATH 2/21 endo and colooscopy Diagnosis 1. Surgical [P], ascending, polyps (3) - TUBULAR ADENOMA (2 FRAGMENTS) - HYPERPLASTIC POLYP (1 FRAGMENTS) - NO HIGH GRADE DYSPLASIA OR MALIGNANCY IDENTIFIED 2. Surgical [P], transverse, sig, polyps (3) - TUBULAR ADENOMA (4 FRAGMENTS) - MULTIPLE FRAGMENTS OF BENIGN COLONIC MUCOSA - NO HIGH GRADE DYSPLASIA OR MALIGNANCY IDENTIFIED 3. Surgical [P], distal esophagus - SQUAMOUS AND GLANDULAR EPITHELIUM WITH ACUTE AND CHRONIC INFLAMMATION - INTESTINAL METAPLASIA PRESENT - NO DYSPLASIA OR MALIGNANCY IDENTIFIED - SEE COMMENT Microscopic Comment 3. With the proper endoscopic findings this is diagnostic of Barrett esophagus. Brooke Sheller MD Pathologist, Electronic Signature (Case signed 03/08/2019)   Assessment / Plan:   #1 status post left lower lobe pneumonia and associated empyema-surgically drained-completing course of antibiotics with continued improvement of postop chest x-ray .  Patient is without  recurrent fever chills or evidence of infection. * STREPTOCOCCUS INTERMEDIUS was cultured from the left pleural fluid  #2 large hiatal hernia with significant portion  of the stomach intrathoracic-with symptomatic reflux and short segment Barrett's  Patient's preop CT scan suggested adenopathy-prominent AP window and left hilar nodes most likely reactive-follow-up CT scan shows resolution of the prominent thoracic adenopathy.   Although it is not definitive the assumption is that her large hiatal hernia, in combination with her upper GI endoscopy resulted in post procedure aspiration and pneumonia in the left lung.  The patient does have a large hiatal hernia and would likely benefit from repair.   She will come to the office and discuss pros and cons of robotic repair of hiatal hernia with Brooke. Kipp Ellis in the near future.  Medication Changes: No orders of the defined types were placed in this encounter.     Brooke Isaac MD      [address] [place] [address] Office [phone]     06/21/2019 11:59 AM

## 2019-06-29 ENCOUNTER — Other Ambulatory Visit: Payer: Self-pay

## 2019-06-29 ENCOUNTER — Institutional Professional Consult (permissible substitution) (INDEPENDENT_AMBULATORY_CARE_PROVIDER_SITE_OTHER): Payer: Self-pay | Admitting: Thoracic Surgery (Cardiothoracic Vascular Surgery)

## 2019-06-29 ENCOUNTER — Encounter: Payer: Self-pay | Admitting: Thoracic Surgery (Cardiothoracic Vascular Surgery)

## 2019-06-29 VITALS — BP 139/89 | HR 87 | Temp 98.8°F | Resp 20 | Ht 59.0 in | Wt 136.0 lb

## 2019-06-29 DIAGNOSIS — K449 Diaphragmatic hernia without obstruction or gangrene: Secondary | ICD-10-CM

## 2019-06-29 DIAGNOSIS — K219 Gastro-esophageal reflux disease without esophagitis: Secondary | ICD-10-CM

## 2019-06-29 NOTE — Progress Notes (Signed)
[address]       [place] [address]             [phone]                    Brooke Ellis [place] Medical Record #[id number] Date of Birth: 07-27-1950  Referring: [name], MD Primary Care: Derinda Late, MD Primary Cardiologist: No primary care provider on file.  Chief Complaint:    Chief Complaint  Patient presents with   Consult    Hiatal hernia, lung nodules; Chest CT 06/21/19    History of Present Illness:    Brooke Ellis 69 y.o. female presents for surgical evaluation of a large paraesophageal hernia.  She was admitted for 10 for an aspiration pneumonia following an EGD in March of this year.  She required a decortication at that time as well.  In regards to her symptoms, she denies any reflux, but has been on 40mg  of prilosec twice daily for a number of years.  When she comes off of it, she has pretty severe reflux.  Her weight has been stable.  She denies any dysphagia, or odynophagia, but states that she has pretty significant early satiety which has been present for over a decade.  She does have a chronic cough, and also admits to early morning scratch throat regularly.         Past Medical History:  Diagnosis Date   Allergy    seasonal   Anemia    Anxiety    Asthma    Depression    Gallstones    GERD (gastroesophageal reflux disease)    Hearing loss    High cholesterol    Impaired glucose tolerance    Internal hemorrhoids    Osteopenia    Overweight    Urinary tract infection    Vitamin D deficiency     Past Surgical History:  Procedure Laterality Date   CHOLECYSTECTOMY     COLONOSCOPY     FOOT SURGERY     GALLBLADDER SURGERY     POLYPECTOMY     TUBAL LIGATION     UPPER GASTROINTESTINAL ENDOSCOPY     VIDEO ASSISTED THORACOSCOPY (VATS)/DECORTICATION Left 04/02/2019   Procedure: VIDEO ASSISTED THORACOSCOPY (VATS)/DECORTICATION, Drainage of Emypema.;  Surgeon: [name],  MD;  Location: [facility] OR;  Service: Thoracic;  Laterality: Left;   VIDEO BRONCHOSCOPY N/A 04/02/2019   Procedure: VIDEO BRONCHOSCOPY;  Surgeon: [name], MD;  Location: [facility] OR;  Service: Thoracic;  Laterality: N/A;    Family History  Problem Relation Age of Onset   Crohn's disease Daughter    Diverticulitis Sister    Colon cancer Neg Hx    Gallbladder disease Neg Hx    Esophageal cancer Neg Hx    Pancreatic cancer Neg Hx    Rectal cancer Neg Hx    Stomach cancer Neg Hx    Colon polyps Neg Hx      Social History   Tobacco Use  Smoking Status Never Smoker  Smokeless Tobacco Never Used    Social History   Substance and Sexual Activity  Alcohol Use No   Alcohol/week: 0.0 standard drinks     Allergies  Allergen Reactions   Macrodantin     Chemical pneumonia    Morphine And Related     hallucinations    Sulfa Antibiotics Itching and Swelling    Current Outpatient Medications  Medication Sig Dispense Refill  buPROPion (WELLBUTRIN XL) 300 MG 24 hr tablet Take 300 mg by mouth daily.       Cholecalciferol (VITAMIN D) 2000 UNITS tablet Take 2,000 Units by mouth daily.     ezetimibe (ZETIA) 10 MG tablet Take 10 mg by mouth every evening.      ferrous sulfate 325 (65 FE) MG tablet Take 1 tablet (325 mg total) by mouth 2 (two) times daily with a meal. (Patient taking differently: Take 325 mg by mouth daily. ) 180 tablet 1   FLUoxetine (PROZAC) 10 MG capsule Take 10 mg by mouth daily.       guaiFENesin (MUCINEX) 600 MG 12 hr tablet Take 1 tablet (600 mg total) by mouth 2 (two) times daily.     ketotifen (ZADITOR) 0.025 % ophthalmic solution Place 1 drop into both eyes daily as needed. For seasonal allergies      montelukast (SINGULAIR) 10 MG tablet Take 10 mg by mouth daily.      omeprazole (PRILOSEC) 40 MG capsule Take 1 tablet 30 minutes before breakfast and 1 tablet 30 minutes before supper 60 capsule 11   ondansetron (ZOFRAN-ODT) 4 MG  disintegrating tablet Take 4 mg by mouth every 8 (eight) hours as needed for nausea or vomiting.      rosuvastatin (CRESTOR) 40 MG tablet Take 40 mg by mouth daily.     No current facility-administered medications for this visit.    Review of Systems  Constitutional: Negative for malaise/fatigue and weight loss.  HENT: Positive for congestion and sore throat.   Respiratory: Positive for cough and shortness of breath.   Cardiovascular: Negative.   Gastrointestinal: Positive for heartburn. Negative for abdominal pain and vomiting.  Musculoskeletal: Negative.   Neurological: Negative.      PHYSICAL EXAMINATION: BP 139/89 (BP Location: Left Arm, Patient Position: Sitting, Cuff Size: Normal)    Pulse 87    Temp 98.8 F (37.1 C) (Temporal)    Resp 20    Ht 4\' 11"  (1.499 m)    Wt 136 lb (61.7 kg)    SpO2 95% Comment: RA   BMI 27.47 kg/m  Physical Exam  Constitutional: She is oriented to person, place, and time.  HENT:  Head: Normocephalic and atraumatic.  Nose: Nose normal.  Eyes: Conjunctivae are normal.  Cardiovascular: Normal rate.  Respiratory: Effort normal. No respiratory distress.  GI: Normal appearance. She exhibits no distension.  Musculoskeletal:        General: Normal range of motion.     Cervical back: Normal range of motion.  Neurological: She is alert and oriented to person, place, and time.  Skin: Skin is warm and dry.  Psychiatric: Mood normal.    Diagnostic Studies & Laboratory data:    CT Scan: IMPRESSION: 1. Resolved thoracic adenopathy. 2. Resolved left lung pneumonia with residual thick pleural-parenchymal scarring at the left lung base. No residual pleural effusions. 3. Moderate to large hiatal hernia. Mildly patulous thoracic esophagus with layering debris, suggesting chronic esophageal Dysmotility.  EGD/EUS:  03/06/19 - There were esophageal mucosal changes secondary to established short-segment Barrett's disease present in the lower third of the  esophagus. The maximum longitudinal extent of these mucosal changes was 2 cm in length (C2, M2). There was no active inflammation, nodularity, or other irregularity. The mucosa was biopsied with a cold forceps for histology. - The stomach was normal except for a large hiatal hernia. - The examined duodenum was normal. - The cardia and gastric fundus were normal on retroflexion.  Manometry:  none Path:   Surgical [P], distal esophagus - SQUAMOUS AND GLANDULAR EPITHELIUM WITH ACUTE AND CHRONIC INFLAMMATION - INTESTINAL METAPLASIA PRESENT - NO DYSPLASIA OR MALIGNANCY IDENTIFIED      I have independently reviewed the above radiology studies  and reviewed the findings with the patient.   Recent Lab Findings: Lab Results  Component Value Date   WBC 11.2 (H) 04/05/2019   HGB 9.7 (L) 04/05/2019   HCT 30.4 (L) 04/05/2019   PLT 685 (H) 04/05/2019   GLUCOSE 98 04/05/2019   TRIG 72 03/28/2019   ALT 38 04/04/2019   AST 38 04/04/2019   NA 140 04/05/2019   K 3.8 04/05/2019   CL 100 04/05/2019   CREATININE 0.60 04/05/2019   BUN 5 (L) 04/05/2019   CO2 29 04/05/2019   INR 1.2 04/02/2019   HGBA1C 6.6 (H) 04/04/2019     Assessment / Plan:   69 yo female with large paraesophageal hiatal hernia, and hx of severe reflux, controlled on medication, and hx of aspiration.  I explained to her the risk, and benefits of surgical repair via an EGD, and  robotic assisted paraesophageal hernia repair, with fundoplication.  I do think that her aspiration is likely due to the hernia.  Additionally, with repair she may be able to come off her reflux medication.  She is agree able to proceed, and would like to wait until October.  She will see me back in clinic in September     I  spent 40 minutes with  the patient face to face and greater then 50% of the time was spent in counseling and coordination of care.    Lajuana Matte 06/29/2019 4:59 PM

## 2019-07-16 ENCOUNTER — Other Ambulatory Visit: Payer: Self-pay | Admitting: *Deleted

## 2019-07-16 ENCOUNTER — Encounter: Payer: Self-pay | Admitting: *Deleted

## 2019-07-16 DIAGNOSIS — K449 Diaphragmatic hernia without obstruction or gangrene: Secondary | ICD-10-CM

## 2019-09-28 ENCOUNTER — Ambulatory Visit (INDEPENDENT_AMBULATORY_CARE_PROVIDER_SITE_OTHER): Payer: Medicare Other | Admitting: Thoracic Surgery (Cardiothoracic Vascular Surgery)

## 2019-09-28 ENCOUNTER — Encounter: Payer: Self-pay | Admitting: Thoracic Surgery (Cardiothoracic Vascular Surgery)

## 2019-09-28 ENCOUNTER — Other Ambulatory Visit: Payer: Self-pay

## 2019-09-28 VITALS — BP 114/75 | HR 84 | Resp 20 | Ht 59.0 in | Wt 144.0 lb

## 2019-09-28 DIAGNOSIS — K449 Diaphragmatic hernia without obstruction or gangrene: Secondary | ICD-10-CM | POA: Diagnosis not present

## 2019-09-28 NOTE — Progress Notes (Signed)
     [address]       [place] [address]             [phone]       Brooke Ellis presents to discuss surgical plans for next month.  She continues to have early satiety and reflux and occasional dysphagia.  We discussed the risk, benefits and alternatives of proceeding with an esophagogastroscopy robotic assisted hiatal hernia repair location.  We also discussed the potential of her needing an absorbable mesh for her crural repair.  She is agreeable to proceed and she is already scheduled for October 6.

## 2019-10-05 ENCOUNTER — Ambulatory Visit: Payer: Medicare Other | Admitting: Thoracic Surgery (Cardiothoracic Vascular Surgery)

## 2019-10-19 NOTE — Progress Notes (Signed)
Your procedure is scheduled on Wednesday, October 24, 2019.  Report to [facility] Main Entrance "A" at 6:30 A.M., and check in at the Admitting office.  Call this number if you have problems the morning of surgery:  [phone]  Call [phone] if you have any questions prior to your surgery date Monday-Friday 8am-4pm    Remember:  Do not eat or drink after midnight the night before your surgery     Take these medicines the morning of surgery with A SIP OF WATER:   buPROPion (WELLBUTRIN XL) FLUoxetine (PROZAC)  guaiFENesin (MUCINEX) montelukast (SINGULAIR) omeprazole (PRILOSEC) ketotifen (ZADITOR) 0.025 % ophthalmic solution - if needed   As of today, STOP taking any Aspirin (unless otherwise instructed by your surgeon) Aleve, Naproxen, Ibuprofen, Motrin, Advil, Goody's, BC's, all herbal medications, fish oil, and all vitamins.                      Do not wear jewelry, make up, or nail polish            Do not wear lotions, powders, perfumes, or deodorant.            Do not shave 48 hours prior to surgery.            Do not bring valuables to the hospital.            [facility] is not responsible for any belongings or valuables.  Do NOT Smoke (Tobacco/Vaping) or drink Alcohol 24 hours prior to your procedure If you use a CPAP at night, you may bring all equipment for your overnight stay.   Contacts, glasses, dentures or bridgework may not be worn into surgery.      For patients admitted to the hospital, discharge time will be determined by your treatment team.   Patients discharged the day of surgery will not be allowed to drive home, and someone needs to stay with them for 24 hours.    Special instructions:   [facility]- Preparing For Surgery  Before surgery, you can play an important role. Because skin is not sterile, your skin needs to be as free of germs as possible. You can reduce the number of germs on your skin by washing with CHG (chlorahexidine gluconate)  Soap before surgery.  CHG is an antiseptic cleaner which kills germs and bonds with the skin to continue killing germs even after washing.    Oral Hygiene is also important to reduce your risk of infection.  Remember - BRUSH YOUR TEETH THE MORNING OF SURGERY WITH YOUR REGULAR TOOTHPASTE  Please do not use if you have an allergy to CHG or antibacterial soaps. If your skin becomes reddened/irritated stop using the CHG.  Do not shave (including legs and underarms) for at least 48 hours prior to first CHG shower. It is OK to shave your face.  Please follow these instructions carefully.   1. Shower the NIGHT BEFORE SURGERY and the MORNING OF SURGERY with CHG Soap.   2. If you chose to wash your hair, wash your hair first as usual with your normal shampoo.  3. After you shampoo, rinse your hair and body thoroughly to remove the shampoo.  4. Use CHG as you would any other liquid soap. You can apply CHG directly to the skin and wash gently with a scrungie or a clean washcloth.   5. Apply the CHG Soap to your body ONLY FROM THE NECK DOWN.  Do not use on open wounds  or open sores. Avoid contact with your eyes, ears, mouth and genitals (private parts). Wash Face and genitals (private parts)  with your normal soap.   6. Wash thoroughly, paying special attention to the area where your surgery will be performed.  7. Thoroughly rinse your body with warm water from the neck down.  8. DO NOT shower/wash with your normal soap after using and rinsing off the CHG Soap.  9. Pat yourself dry with a CLEAN TOWEL.  10. Wear CLEAN PAJAMAS to bed the night before surgery  11. Place CLEAN SHEETS on your bed the night of your first shower and DO NOT SLEEP WITH PETS.   Day of Surgery: Wear Clean/Comfortable clothing the morning of surgery Do not apply any deodorants/lotions.   Remember to brush your teeth WITH YOUR REGULAR TOOTHPASTE.   Please read over the following fact sheets that you were  given.

## 2019-10-22 ENCOUNTER — Encounter (HOSPITAL_COMMUNITY): Payer: Self-pay

## 2019-10-22 ENCOUNTER — Encounter (HOSPITAL_COMMUNITY)
Admission: RE | Admit: 2019-10-22 | Discharge: 2019-10-22 | Disposition: A | Discharge status: Home / Self Care | Payer: Medicare Other | Source: Ambulatory Visit | Attending: Thoracic Surgery (Cardiothoracic Vascular Surgery) | Admitting: Thoracic Surgery (Cardiothoracic Vascular Surgery)

## 2019-10-22 ENCOUNTER — Other Ambulatory Visit: Payer: Self-pay

## 2019-10-22 ENCOUNTER — Ambulatory Visit (HOSPITAL_COMMUNITY)
Admission: RE | Admit: 2019-10-22 | Discharge: 2019-10-22 | Disposition: A | Discharge status: Home / Self Care | Payer: Medicare Other | Source: Ambulatory Visit | Attending: Thoracic Surgery (Cardiothoracic Vascular Surgery) | Admitting: Thoracic Surgery (Cardiothoracic Vascular Surgery)

## 2019-10-22 ENCOUNTER — Other Ambulatory Visit (HOSPITAL_COMMUNITY)
Admission: RE | Admit: 2019-10-22 | Discharge: 2019-10-22 | Disposition: A | Discharge status: Home / Self Care | Payer: Medicare Other | Source: Ambulatory Visit | Attending: Thoracic Surgery (Cardiothoracic Vascular Surgery) | Admitting: Thoracic Surgery (Cardiothoracic Vascular Surgery)

## 2019-10-22 DIAGNOSIS — K449 Diaphragmatic hernia without obstruction or gangrene: Secondary | ICD-10-CM | POA: Diagnosis present

## 2019-10-22 DIAGNOSIS — Z01818 Encounter for other preprocedural examination: Secondary | ICD-10-CM | POA: Insufficient documentation

## 2019-10-22 DIAGNOSIS — Z20822 Contact with and (suspected) exposure to covid-19: Secondary | ICD-10-CM | POA: Diagnosis not present

## 2019-10-22 HISTORY — DX: Pneumonia, unspecified organism: J18.9

## 2019-10-22 LAB — COMPREHENSIVE METABOLIC PANEL
ALT: 18 U/L (ref 0–44)
AST: 19 U/L (ref 15–41)
Albumin: 3.7 g/dL (ref 3.5–5.0)
Alkaline Phosphatase: 68 U/L (ref 38–126)
Anion gap: 12 (ref 5–15)
BUN: 7 mg/dL — ABNORMAL LOW (ref 8–23)
CO2: 22 mmol/L (ref 22–32)
Calcium: 8.8 mg/dL — ABNORMAL LOW (ref 8.9–10.3)
Chloride: 104 mmol/L (ref 98–111)
Creatinine, Ser: 0.8 mg/dL (ref 0.44–1.00)
GFR calc Af Amer: >60 mL/min (ref >60)
GFR calc non Af Amer: >60 mL/min (ref >60)
Glucose, Bld: 91 mg/dL (ref 70–99)
Potassium: 3.7 mmol/L (ref 3.5–5.1)
Sodium: 138 mmol/L (ref 135–145)
Total Bilirubin: 1.3 mg/dL — ABNORMAL HIGH (ref 0.3–1.2)
Total Protein: 6.2 g/dL — ABNORMAL LOW (ref 6.5–8.1)

## 2019-10-22 LAB — CBC
HCT: 42 % (ref 36.0–46.0)
Hemoglobin: 13.4 g/dL (ref 12.0–15.0)
MCH: 28.5 pg (ref 26.0–34.0)
MCHC: 31.9 g/dL (ref 30.0–36.0)
MCV: 89.4 fL (ref 80.0–100.0)
Platelets: 289 10*3/uL (ref 150–400)
RBC: 4.7 MIL/uL (ref 3.87–5.11)
RDW: 13.2 % (ref 11.5–15.5)
WBC: 5.8 10*3/uL (ref 4.0–10.5)
nRBC: 0 % (ref 0.0–0.2)

## 2019-10-22 LAB — URINALYSIS, ROUTINE W REFLEX MICROSCOPIC
Bilirubin Urine: NEGATIVE
Glucose, UA: NEGATIVE mg/dL
Hgb urine dipstick: NEGATIVE
Ketones, ur: NEGATIVE mg/dL
Leukocytes,Ua: NEGATIVE
Nitrite: NEGATIVE
Protein, ur: NEGATIVE mg/dL
Specific Gravity, Urine: 1.011 (ref 1.005–1.030)
pH: 7 (ref 5.0–8.0)

## 2019-10-22 LAB — PROTIME-INR
INR: 1 (ref 0.8–1.2)
Prothrombin Time: 12.5 seconds (ref 11.4–15.2)

## 2019-10-22 LAB — TYPE AND SCREEN
ABO/RH(D): A POS
Antibody Screen: NEGATIVE

## 2019-10-22 LAB — BLOOD GAS, ARTERIAL
Acid-Base Excess: 2.4 mmol/L — ABNORMAL HIGH (ref 0.0–2.0)
Bicarbonate: 26.3 mmol/L (ref 20.0–28.0)
Drawn by: 58793
FIO2: 21
O2 Saturation: 96.2 %
Patient temperature: 37
pCO2 arterial: 39.2 mmHg (ref 32.0–48.0)
pH, Arterial: 7.441 (ref 7.350–7.450)
pO2, Arterial: 80.6 mmHg — ABNORMAL LOW (ref 83.0–108.0)

## 2019-10-22 LAB — SARS CORONAVIRUS 2 (TAT 6-24 HRS): SARS Coronavirus 2: NEGATIVE

## 2019-10-22 LAB — SURGICAL PCR SCREEN
MRSA, PCR: NEGATIVE
Staphylococcus aureus: NEGATIVE

## 2019-10-22 LAB — APTT: aPTT: 28 seconds (ref 24–36)

## 2019-10-22 NOTE — Progress Notes (Signed)
PCP - Dr. Derinda Late Cardiologist - No primary. Referral from Dr. Servando Snare for surgery.  PPM/ICD - Denies  Chest x-ray - 10/22/19 EKG - 10/22/19 Stress Test - Denies ECHO - Denies Cardiac Cath - Denies  Sleep Study - Denies  Patient denies being diabetic.  Blood Thinner Instructions: N/A Aspirin Instructions: N/A  ERAS Protcol - No   COVID TEST- 10/22/19   Anesthesia review: Yes, abnormal EKG  Patient denies shortness of breath, fever, cough and chest pain at PAT appointment   All instructions explained to the patient, with a verbal understanding of the material. Patient agrees to go over the instructions while at home for a better understanding. Patient also instructed to self quarantine after being tested for COVID-19. The opportunity to ask questions was provided.

## 2019-10-23 NOTE — Anesthesia Preprocedure Evaluation (Addendum)
Anesthesia Evaluation  Patient identified by MRN, date of birth, ID band Patient awake    Reviewed: Allergy & Precautions, H&P , NPO status , Patient's Chart, lab work & pertinent test results  Airway Mallampati: II  TM Distance: >3 FB Neck ROM: Full    Dental no notable dental hx. (+) Teeth Intact, Dental Advisory Given   Pulmonary asthma ,    Pulmonary exam normal breath sounds clear to auscultation       Cardiovascular Exercise Tolerance: Good negative cardio ROS   Rhythm:Regular Rate:Normal     Neuro/Psych Anxiety Depression negative neurological ROS     GI/Hepatic Neg liver ROS, GERD  Medicated,  Endo/Other  negative endocrine ROS  Renal/GU negative Renal ROS  negative genitourinary   Musculoskeletal   Abdominal   Peds  Hematology  (+) Blood dyscrasia, anemia ,   Anesthesia Other Findings   Reproductive/Obstetrics negative OB ROS                            Anesthesia Physical Anesthesia Plan  ASA: II  Anesthesia Plan: General   Post-op Pain Management:    Induction: Intravenous  PONV Risk Score and Plan: 4 or greater and Ondansetron, Dexamethasone and Midazolam  Airway Management Planned: Oral ETT  Additional Equipment: Arterial line  Intra-op Plan:   Post-operative Plan: Extubation in OR  Informed Consent: I have reviewed the patients History and Physical, chart, labs and discussed the procedure including the risks, benefits and alternatives for the proposed anesthesia with the patient or authorized representative who has indicated his/her understanding and acceptance.     Dental advisory given  Plan Discussed with: CRNA  Anesthesia Plan Comments:        Anesthesia Quick Evaluation

## 2019-10-24 ENCOUNTER — Encounter (HOSPITAL_COMMUNITY)
Admission: RE | Disposition: A | Discharge status: Home / Self Care | Payer: Self-pay | Source: Home / Self Care | Attending: Thoracic Surgery (Cardiothoracic Vascular Surgery)

## 2019-10-24 ENCOUNTER — Inpatient Hospital Stay (HOSPITAL_COMMUNITY): Payer: Medicare Other | Admitting: Registered Nurse

## 2019-10-24 ENCOUNTER — Other Ambulatory Visit: Payer: Self-pay

## 2019-10-24 ENCOUNTER — Observation Stay (HOSPITAL_COMMUNITY)
Admission: RE | Admit: 2019-10-24 | Discharge: 2019-10-25 | Disposition: A | Discharge status: Home / Self Care | Payer: Medicare Other | Attending: Thoracic Surgery (Cardiothoracic Vascular Surgery) | Admitting: Thoracic Surgery (Cardiothoracic Vascular Surgery)

## 2019-10-24 ENCOUNTER — Inpatient Hospital Stay (HOSPITAL_COMMUNITY): Payer: Medicare Other | Admitting: Physician Assistant

## 2019-10-24 ENCOUNTER — Encounter (HOSPITAL_COMMUNITY): Payer: Self-pay | Admitting: Thoracic Surgery (Cardiothoracic Vascular Surgery)

## 2019-10-24 DIAGNOSIS — J939 Pneumothorax, unspecified: Secondary | ICD-10-CM

## 2019-10-24 DIAGNOSIS — K219 Gastro-esophageal reflux disease without esophagitis: Secondary | ICD-10-CM | POA: Diagnosis not present

## 2019-10-24 DIAGNOSIS — J45909 Unspecified asthma, uncomplicated: Secondary | ICD-10-CM | POA: Insufficient documentation

## 2019-10-24 DIAGNOSIS — K449 Diaphragmatic hernia without obstruction or gangrene: Principal | ICD-10-CM | POA: Insufficient documentation

## 2019-10-24 DIAGNOSIS — Z8719 Personal history of other diseases of the digestive system: Secondary | ICD-10-CM

## 2019-10-24 HISTORY — PX: XI ROBOTIC ASSISTED PARAESOPHAGEAL HERNIA REPAIR: SHX6871

## 2019-10-24 HISTORY — PX: XI ROBOTIC ASSISTED REPAIR OF DIAPHRAGMATIC HERNIA: SHX6865

## 2019-10-24 HISTORY — PX: HERNIA REPAIR: SHX51

## 2019-10-24 HISTORY — PX: ESOPHAGOGASTRODUODENOSCOPY: SHX5428

## 2019-10-24 HISTORY — PX: ESOPHAGOGASTRODUODENOSCOPY: SHX1529

## 2019-10-24 LAB — CBC
HCT: 40.4 % (ref 36.0–46.0)
Hemoglobin: 13.1 g/dL (ref 12.0–15.0)
MCH: 28.8 pg (ref 26.0–34.0)
MCHC: 32.4 g/dL (ref 30.0–36.0)
MCV: 88.8 fL (ref 80.0–100.0)
Platelets: 267 10*3/uL (ref 150–400)
RBC: 4.55 MIL/uL (ref 3.87–5.11)
RDW: 13.1 % (ref 11.5–15.5)
WBC: 12.4 10*3/uL — ABNORMAL HIGH (ref 4.0–10.5)
nRBC: 0 % (ref 0.0–0.2)

## 2019-10-24 LAB — POCT I-STAT 7, (LYTES, BLD GAS, ICA,H+H)
Acid-Base Excess: 2 mmol/L (ref 0.0–2.0)
Bicarbonate: 27.2 mmol/L (ref 20.0–28.0)
Calcium, Ion: 1.2 mmol/L (ref 1.15–1.40)
HCT: 37 % (ref 36.0–46.0)
Hemoglobin: 12.6 g/dL (ref 12.0–15.0)
O2 Saturation: 100 %
Potassium: 3.7 mmol/L (ref 3.5–5.1)
Sodium: 140 mmol/L (ref 135–145)
TCO2: 28 mmol/L (ref 22–32)
pCO2 arterial: 41.8 mmHg (ref 32.0–48.0)
pH, Arterial: 7.421 (ref 7.350–7.450)
pO2, Arterial: 169 mmHg — ABNORMAL HIGH (ref 83.0–108.0)

## 2019-10-24 LAB — CREATININE, SERUM
Creatinine, Ser: 0.75 mg/dL (ref 0.44–1.00)
GFR calc non Af Amer: >60 mL/min (ref >60)

## 2019-10-24 SURGERY — REPAIR, HERNIA, PARAESOPHAGEAL, ROBOT-ASSISTED
Anesthesia: General | Site: Chest

## 2019-10-24 MED ORDER — POLYETHYLENE GLYCOL 3350 17 G PO PACK: 17.0000 g | PACK | Freq: Every day | ORAL | Status: DC | PRN

## 2019-10-24 MED ORDER — ORAL CARE MOUTH RINSE: 15.0000 mL | Freq: Once | OROMUCOSAL | Status: AC

## 2019-10-24 MED ORDER — ONDANSETRON 4 MG PO TBDP: 4.0000 mg | ORAL_TABLET | Freq: Four times a day (QID) | ORAL | Status: DC

## 2019-10-24 MED ORDER — SODIUM CHLORIDE 0.9 % IV SOLN: INTRAVENOUS | Status: DC

## 2019-10-24 MED ORDER — ACETAMINOPHEN 650 MG RE SUPP: 650.0000 mg | Freq: Four times a day (QID) | RECTAL | Status: DC | PRN

## 2019-10-24 MED ORDER — PROPOFOL 10 MG/ML IV BOLUS
INTRAVENOUS | Status: DC | PRN
  Administered 2019-10-24: 150 mg via INTRAVENOUS

## 2019-10-24 MED ORDER — 0.9 % SODIUM CHLORIDE (POUR BTL) OPTIME
TOPICAL | Status: DC | PRN
  Administered 2019-10-24: 2000 mL

## 2019-10-24 MED ORDER — ONDANSETRON HCL 4 MG/2ML IJ SOLN
4.0000 mg | Freq: Four times a day (QID) | INTRAMUSCULAR | Status: DC
  Administered 2019-10-24 – 2019-10-25 (×4): 4 mg via INTRAVENOUS
  Filled 2019-10-24 (×4): qty 2

## 2019-10-24 MED ORDER — FENTANYL CITRATE (PF) 250 MCG/5ML IJ SOLN
INTRAMUSCULAR | Status: AC
  Filled 2019-10-24: qty 5

## 2019-10-24 MED ORDER — ACETAMINOPHEN 500 MG PO TABS
1000.0000 mg | ORAL_TABLET | Freq: Once | ORAL | Status: AC
  Administered 2019-10-24: 1000 mg via ORAL
  Filled 2019-10-24: qty 2

## 2019-10-24 MED ORDER — BISACODYL 10 MG RE SUPP: 10.0000 mg | Freq: Every day | RECTAL | Status: DC | PRN

## 2019-10-24 MED ORDER — BUPIVACAINE LIPOSOME 1.3 % IJ SUSP
20.0000 mL | INTRAMUSCULAR | Status: AC
  Administered 2019-10-24: 20 mL
  Filled 2019-10-24: qty 20

## 2019-10-24 MED ORDER — MIDAZOLAM HCL 2 MG/2ML IJ SOLN
INTRAMUSCULAR | Status: DC | PRN
  Administered 2019-10-24 (×2): 1 mg via INTRAVENOUS

## 2019-10-24 MED ORDER — BUPIVACAINE HCL (PF) 0.5 % IJ SOLN
INTRAMUSCULAR | Status: AC
  Filled 2019-10-24: qty 30

## 2019-10-24 MED ORDER — MIDAZOLAM HCL 2 MG/2ML IJ SOLN
INTRAMUSCULAR | Status: AC
  Filled 2019-10-24: qty 2

## 2019-10-24 MED ORDER — EPINEPHRINE PF 1 MG/ML IJ SOLN
INTRAMUSCULAR | Status: AC
  Filled 2019-10-24: qty 1

## 2019-10-24 MED ORDER — KETOROLAC TROMETHAMINE 15 MG/ML IJ SOLN
15.0000 mg | Freq: Four times a day (QID) | INTRAMUSCULAR | Status: DC
  Administered 2019-10-24 – 2019-10-25 (×4): 15 mg via INTRAVENOUS
  Filled 2019-10-24 (×4): qty 1

## 2019-10-24 MED ORDER — ALBUMIN HUMAN 5 % IV SOLN: INTRAVENOUS | Status: DC | PRN

## 2019-10-24 MED ORDER — PROPOFOL 10 MG/ML IV BOLUS
INTRAVENOUS | Status: AC
  Filled 2019-10-24: qty 20

## 2019-10-24 MED ORDER — BUPIVACAINE HCL 0.5 % IJ SOLN
INTRAMUSCULAR | Status: DC | PRN
  Administered 2019-10-24: 30 mL

## 2019-10-24 MED ORDER — FENTANYL CITRATE (PF) 100 MCG/2ML IJ SOLN
50.0000 ug | INTRAMUSCULAR | Status: DC | PRN
  Administered 2019-10-24 (×2): 50 ug via INTRAVENOUS
  Filled 2019-10-24 (×2): qty 2

## 2019-10-24 MED ORDER — SUCCINYLCHOLINE CHLORIDE 200 MG/10ML IV SOSY
PREFILLED_SYRINGE | INTRAVENOUS | Status: DC | PRN
  Administered 2019-10-24: 80 mg via INTRAVENOUS

## 2019-10-24 MED ORDER — DEXAMETHASONE SODIUM PHOSPHATE 10 MG/ML IJ SOLN
INTRAMUSCULAR | Status: AC
  Filled 2019-10-24: qty 1

## 2019-10-24 MED ORDER — ACETAMINOPHEN 325 MG PO TABS: 650.0000 mg | ORAL_TABLET | Freq: Four times a day (QID) | ORAL | Status: DC | PRN

## 2019-10-24 MED ORDER — SUGAMMADEX SODIUM 200 MG/2ML IV SOLN
INTRAVENOUS | Status: DC | PRN
  Administered 2019-10-24: 200 mg via INTRAVENOUS

## 2019-10-24 MED ORDER — ROCURONIUM BROMIDE 10 MG/ML (PF) SYRINGE
PREFILLED_SYRINGE | INTRAVENOUS | Status: AC
  Filled 2019-10-24: qty 10

## 2019-10-24 MED ORDER — DEXAMETHASONE SODIUM PHOSPHATE 10 MG/ML IJ SOLN
INTRAMUSCULAR | Status: DC | PRN
  Administered 2019-10-24: 5 mg via INTRAVENOUS

## 2019-10-24 MED ORDER — SODIUM CHLORIDE 0.9 % IV SOLN: INTRAVENOUS | Status: DC | PRN

## 2019-10-24 MED ORDER — ONDANSETRON HCL 4 MG/2ML IJ SOLN
INTRAMUSCULAR | Status: AC
  Filled 2019-10-24: qty 2

## 2019-10-24 MED ORDER — KETOTIFEN FUMARATE 0.025 % OP SOLN
1.0000 [drp] | Freq: Every day | OPHTHALMIC | Status: DC | PRN
  Filled 2019-10-24: qty 5

## 2019-10-24 MED ORDER — KETOROLAC TROMETHAMINE 30 MG/ML IJ SOLN
INTRAMUSCULAR | Status: AC
  Filled 2019-10-24: qty 1

## 2019-10-24 MED ORDER — LIDOCAINE 2% (20 MG/ML) 5 ML SYRINGE
INTRAMUSCULAR | Status: AC
  Filled 2019-10-24: qty 5

## 2019-10-24 MED ORDER — KETOROLAC TROMETHAMINE 30 MG/ML IJ SOLN
INTRAMUSCULAR | Status: DC | PRN
  Administered 2019-10-24: 15 mg via INTRAVENOUS

## 2019-10-24 MED ORDER — FENTANYL CITRATE (PF) 100 MCG/2ML IJ SOLN
INTRAMUSCULAR | Status: AC
  Filled 2019-10-24: qty 2

## 2019-10-24 MED ORDER — CEFAZOLIN SODIUM-DEXTROSE 2-4 GM/100ML-% IV SOLN
2.0000 g | INTRAVENOUS | Status: AC
  Administered 2019-10-24: 2 g via INTRAVENOUS
  Filled 2019-10-24: qty 100

## 2019-10-24 MED ORDER — ONDANSETRON HCL 4 MG/2ML IJ SOLN
INTRAMUSCULAR | Status: DC | PRN
  Administered 2019-10-24: 4 mg via INTRAVENOUS

## 2019-10-24 MED ORDER — CHLORHEXIDINE GLUCONATE 0.12 % MT SOLN
15.0000 mL | Freq: Once | OROMUCOSAL | Status: AC
  Administered 2019-10-24: 15 mL via OROMUCOSAL
  Filled 2019-10-24: qty 15

## 2019-10-24 MED ORDER — LACTATED RINGERS IV SOLN: INTRAVENOUS | Status: DC

## 2019-10-24 MED ORDER — SUCCINYLCHOLINE CHLORIDE 200 MG/10ML IV SOSY
PREFILLED_SYRINGE | INTRAVENOUS | Status: AC
  Filled 2019-10-24: qty 10

## 2019-10-24 MED ORDER — FENTANYL CITRATE (PF) 250 MCG/5ML IJ SOLN
INTRAMUSCULAR | Status: DC | PRN
Start: 2019-10-24 — End: 2019-10-24
  Administered 2019-10-24 (×2): 50 ug via INTRAVENOUS
  Administered 2019-10-24 (×4): 25 ug via INTRAVENOUS

## 2019-10-24 MED ORDER — ENOXAPARIN SODIUM 40 MG/0.4ML ~~LOC~~ SOLN
40.0000 mg | SUBCUTANEOUS | Status: DC
  Administered 2019-10-25: 40 mg via SUBCUTANEOUS
  Filled 2019-10-24: qty 0.4

## 2019-10-24 MED ORDER — FENTANYL CITRATE (PF) 100 MCG/2ML IJ SOLN
25.0000 ug | INTRAMUSCULAR | Status: DC | PRN
  Administered 2019-10-24 (×4): 25 ug via INTRAVENOUS

## 2019-10-24 MED ORDER — EPHEDRINE SULFATE-NACL 50-0.9 MG/10ML-% IV SOSY
PREFILLED_SYRINGE | INTRAVENOUS | Status: DC | PRN
  Administered 2019-10-24: 5 mg via INTRAVENOUS
  Administered 2019-10-24: 10 mg via INTRAVENOUS

## 2019-10-24 MED ORDER — CEFAZOLIN SODIUM-DEXTROSE 2-4 GM/100ML-% IV SOLN
2.0000 g | Freq: Three times a day (TID) | INTRAVENOUS | Status: AC
  Administered 2019-10-24: 2 g via INTRAVENOUS
  Filled 2019-10-24: qty 100

## 2019-10-24 MED ORDER — PHENYLEPHRINE 40 MCG/ML (10ML) SYRINGE FOR IV PUSH (FOR BLOOD PRESSURE SUPPORT)
PREFILLED_SYRINGE | INTRAVENOUS | Status: AC
  Filled 2019-10-24: qty 10

## 2019-10-24 MED ORDER — ROCURONIUM BROMIDE 10 MG/ML (PF) SYRINGE
PREFILLED_SYRINGE | INTRAVENOUS | Status: DC | PRN
  Administered 2019-10-24: 20 mg via INTRAVENOUS
  Administered 2019-10-24: 10 mg via INTRAVENOUS
  Administered 2019-10-24 (×2): 50 mg via INTRAVENOUS
  Administered 2019-10-24: 10 mg via INTRAVENOUS

## 2019-10-24 MED ORDER — PHENYLEPHRINE HCL-NACL 10-0.9 MG/250ML-% IV SOLN
INTRAVENOUS | Status: DC | PRN
  Administered 2019-10-24: 30 ug/min via INTRAVENOUS

## 2019-10-24 MED ORDER — EPHEDRINE 5 MG/ML INJ
INTRAVENOUS | Status: AC
  Filled 2019-10-24: qty 10

## 2019-10-24 MED ORDER — LIDOCAINE 2% (20 MG/ML) 5 ML SYRINGE
INTRAMUSCULAR | Status: DC | PRN
  Administered 2019-10-24: 60 mg via INTRAVENOUS

## 2019-10-24 MED ORDER — PANTOPRAZOLE SODIUM 40 MG IV SOLR
40.0000 mg | Freq: Every day | INTRAVENOUS | Status: DC
  Administered 2019-10-24: 40 mg via INTRAVENOUS
  Filled 2019-10-24: qty 40

## 2019-10-24 SURGICAL SUPPLY — 105 items
ADH SKN CLS APL DERMABOND .7 (GAUZE/BANDAGES/DRESSINGS) ×3
BLADE CLIPPER SURG (BLADE) ×3 IMPLANT
BLADE SURG 11 STRL SS (BLADE) ×5 IMPLANT
BUTTON OLYMPUS DEFENDO 5 PIECE (MISCELLANEOUS) ×5 IMPLANT
CANISTER SUCT 3000ML PPV (MISCELLANEOUS) ×10 IMPLANT
CANNULA REDUC XI 12-8 STAPL (CANNULA)
CANNULA REDUC XI 12-8MM STAPL (CANNULA)
CANNULA REDUCER 12-8 DVNC XI (CANNULA) IMPLANT
CATH THORACIC 28FR (CATHETERS) IMPLANT
CNTNR URN SCR LID CUP LEK RST (MISCELLANEOUS) ×3 IMPLANT
CONT SPEC 4OZ STRL OR WHT (MISCELLANEOUS) ×5
COVER BACK TABLE 60X90IN (DRAPES) ×5 IMPLANT
COVER MAYO STAND STRL (DRAPES) ×2 IMPLANT
DECANTER SPIKE VIAL GLASS SM (MISCELLANEOUS) ×3 IMPLANT
DEFOGGER SCOPE WARMER CLEARIFY (MISCELLANEOUS) ×5 IMPLANT
DERMABOND ADVANCED (GAUZE/BANDAGES/DRESSINGS) ×2
DERMABOND ADVANCED .7 DNX12 (GAUZE/BANDAGES/DRESSINGS) ×6 IMPLANT
DEVICE SUTURE ENDOST 10MM (ENDOMECHANICALS) ×2 IMPLANT
DRAIN PENROSE 1/4X12 LTX STRL (WOUND CARE) ×5 IMPLANT
DRAPE COLUMN DVNC XI (DISPOSABLE) ×12 IMPLANT
DRAPE CV SPLIT W-CLR ANES SCRN (DRAPES) ×5 IMPLANT
DRAPE DA VINCI XI COLUMN (DISPOSABLE) ×20
DRAPE HALF SHEET 40X57 (DRAPES) ×2 IMPLANT
DRAPE INCISE IOBAN 66X45 STRL (DRAPES) IMPLANT
DRAPE ORTHO SPLIT 77X108 STRL (DRAPES) ×5
DRAPE SURG ORHT 6 SPLT 77X108 (DRAPES) ×3 IMPLANT
ELECT REM PT RETURN 9FT ADLT (ELECTROSURGICAL) ×5
ELECTRODE REM PT RTRN 9FT ADLT (ELECTROSURGICAL) ×3 IMPLANT
FELT TEFLON 1X6 (MISCELLANEOUS) IMPLANT
FORCEPS GRASP COMBO 8X230 (FORCEP) ×3 IMPLANT
GAUZE KITTNER 4X5 RF (MISCELLANEOUS) ×2 IMPLANT
GAUZE SPONGE 4X4 12PLY STRL (GAUZE/BANDAGES/DRESSINGS) ×5 IMPLANT
GLOVE BIO SURGEON STRL SZ 6.5 (GLOVE) ×3 IMPLANT
GLOVE BIO SURGEON STRL SZ7 (GLOVE) ×7 IMPLANT
GLOVE BIO SURGEON STRL SZ7.5 (GLOVE) ×5 IMPLANT
GLOVE BIO SURGEONS STRL SZ 6.5 (GLOVE) ×3
GLOVE BIOGEL PI IND STRL 6.5 (GLOVE) IMPLANT
GLOVE BIOGEL PI IND STRL 7.0 (GLOVE) IMPLANT
GLOVE BIOGEL PI INDICATOR 6.5 (GLOVE) ×8
GLOVE BIOGEL PI INDICATOR 7.0 (GLOVE) ×2
GLOVE ECLIPSE 6.5 STRL STRAW (GLOVE) ×2 IMPLANT
GLOVE SURG SS PI 6.5 STRL IVOR (GLOVE) ×2 IMPLANT
GOWN STRL NON-REIN LRG LVL3 (GOWN DISPOSABLE) ×2 IMPLANT
GOWN STRL REUS W/ TWL LRG LVL3 (GOWN DISPOSABLE) ×3 IMPLANT
GOWN STRL REUS W/ TWL XL LVL3 (GOWN DISPOSABLE) ×6 IMPLANT
GOWN STRL REUS W/TWL 2XL LVL3 (GOWN DISPOSABLE) ×9 IMPLANT
GOWN STRL REUS W/TWL LRG LVL3 (GOWN DISPOSABLE) ×20
GOWN STRL REUS W/TWL XL LVL3 (GOWN DISPOSABLE) ×10
GRASPER SUT TROCAR 14GX15 (MISCELLANEOUS) IMPLANT
HEMOSTAT SURGICEL 2X14 (HEMOSTASIS) IMPLANT
IV NS 1000ML (IV SOLUTION)
IV NS 1000ML BAXH (IV SOLUTION) IMPLANT
KIT BASIN OR (CUSTOM PROCEDURE TRAY) ×5 IMPLANT
KIT TURNOVER KIT B (KITS) ×5 IMPLANT
MARKER SKIN DUAL TIP RULER LAB (MISCELLANEOUS) ×5 IMPLANT
MATRIX GENTRIX 7.5X6 HIATAL (Tissue) ×2 IMPLANT
NDL 18GX1X1/2 (RX/OR ONLY) (NEEDLE) IMPLANT
NDL SCLEROTHERAPY 23X2X240 (NEEDLE) IMPLANT
NEEDLE 18GX1X1/2 (RX/OR ONLY) (NEEDLE) IMPLANT
NEEDLE SCLEROTHERAPY 23X2X240 (NEEDLE) IMPLANT
NS IRRIG 1000ML POUR BTL (IV SOLUTION) ×10 IMPLANT
OBTURATOR OPTICAL STANDARD 8MM (TROCAR) ×5
OBTURATOR OPTICAL STND 8 DVNC (TROCAR) ×3
OBTURATOR OPTICALSTD 8 DVNC (TROCAR) IMPLANT
OIL SILICONE PENTAX (PARTS (SERVICE/REPAIRS)) IMPLANT
PACK CHEST (CUSTOM PROCEDURE TRAY) ×5 IMPLANT
PACK UNIVERSAL I (CUSTOM PROCEDURE TRAY) ×5 IMPLANT
PAD ARMBOARD 7.5X6 YLW CONV (MISCELLANEOUS) ×10 IMPLANT
PORT ACCESS TROCAR AIRSEAL 12 (TROCAR) ×3 IMPLANT
PORT ACCESS TROCAR AIRSEAL 5M (TROCAR) ×2
RELOAD ENDO STITCH (ENDOMECHANICALS) ×15 IMPLANT
RELOAD SUT TRIPLE-STITCH 2-0 (ENDOMECHANICALS) IMPLANT
SEAL CANN UNIV 5-8 DVNC XI (MISCELLANEOUS) ×12 IMPLANT
SEAL XI 5MM-8MM UNIVERSAL (MISCELLANEOUS) ×20
SEALER SYNCHRO 8 IS4000 DV (MISCELLANEOUS) ×5
SEALER SYNCHRO 8 IS4000 DVNC (MISCELLANEOUS) IMPLANT
SET TRI-LUMEN FLTR TB AIRSEAL (TUBING) ×5 IMPLANT
SHEET MEDIUM DRAPE 40X70 STRL (DRAPES) ×5 IMPLANT
STAPLER CANNULA SEAL DVNC XI (STAPLE) IMPLANT
STAPLER CANNULA SEAL XI (STAPLE)
SUT ETHIBOND 0 36 GRN (SUTURE) ×14 IMPLANT
SUT SILK  1 MH (SUTURE) ×10
SUT SILK 1 MH (SUTURE) ×3 IMPLANT
SUT SURGIDAC NAB ES-9 0 48 120 (SUTURE) IMPLANT
SUT VIC AB 3-0 SH 27 (SUTURE) ×10
SUT VIC AB 3-0 SH 27X BRD (SUTURE) IMPLANT
SUT VIC AB 3-0 X1 27 (SUTURE) ×10 IMPLANT
SUT VICRYL 0 UR6 27IN ABS (SUTURE) ×10 IMPLANT
SUT VLOC 180 0 6IN GS21 (SUTURE) ×6 IMPLANT
SYR 10ML LL (SYRINGE) ×5 IMPLANT
SYR 20ML ECCENTRIC (SYRINGE) ×5 IMPLANT
SYR 30ML LL (SYRINGE) ×2 IMPLANT
SYR 30ML SLIP (SYRINGE) ×5 IMPLANT
SYR BULB IRRIG 60ML STRL (SYRINGE) ×2 IMPLANT
SYSTEM SAHARA CHEST DRAIN ATS (WOUND CARE) IMPLANT
TOWEL GREEN STERILE (TOWEL DISPOSABLE) ×5 IMPLANT
TOWEL GREEN STERILE FF (TOWEL DISPOSABLE) ×5 IMPLANT
TRAY FOLEY MTR SLVR 16FR STAT (SET/KITS/TRAYS/PACK) ×5 IMPLANT
TROCAR XCEL BLADELESS 5X75MML (TROCAR) ×5 IMPLANT
TROCAR XCEL NON-BLD 5MMX100MML (ENDOMECHANICALS) IMPLANT
TUBE CONNECTING 20'X1/4 (TUBING) ×1
TUBE CONNECTING 20X1/4 (TUBING) ×4 IMPLANT
TUBING ENDO SMARTCAP (MISCELLANEOUS) ×5 IMPLANT
UNDERPAD 30X36 HEAVY ABSORB (UNDERPADS AND DIAPERS) ×5 IMPLANT
WATER STERILE IRR 1000ML POUR (IV SOLUTION) ×5 IMPLANT

## 2019-10-24 NOTE — Anesthesia Procedure Notes (Addendum)
Arterial Line Insertion Start/End10/06/2019 7:35 AM, 10/24/2019 7:50 AM Performed by: Reece Agar, CRNA, CRNA  Patient location: Pre-op. Preanesthetic checklist: patient identified, IV checked, site marked, risks and benefits discussed, surgical consent, monitors and equipment checked, pre-op evaluation, timeout performed and anesthesia consent Lidocaine 1% used for infiltration Right, radial was placed Catheter size: 20 G Hand hygiene performed  and maximum sterile barriers used   Attempts: 1 Procedure performed without using ultrasound guided technique. Following insertion, dressing applied and Biopatch. Post procedure assessment: normal and unchanged  Patient tolerated the procedure well with no immediate complications.

## 2019-10-24 NOTE — Anesthesia Postprocedure Evaluation (Signed)
Anesthesia Post Note  Patient: Therapist, sports  Procedure(s) Performed: XI ROBOTIC ASSISTED PARAESOPHAGEAL HERNIA REPAIR WITH GASTROPEXY (N/A Chest) ESOPHAGOGASTRODUODENOSCOPY (EGD) (N/A ) XI ROBOTIC ASSISTED REPAIR OF DIAPHRAGMATIC HERNIA     Patient location during evaluation: PACU Anesthesia Type: General Level of consciousness: awake and alert Pain management: pain level controlled Vital Signs Assessment: post-procedure vital signs reviewed and stable Respiratory status: spontaneous breathing, nonlabored ventilation, respiratory function stable and patient connected to nasal cannula oxygen Cardiovascular status: blood pressure returned to baseline and stable Postop Assessment: no apparent nausea or vomiting Anesthetic complications: no   No complications documented.  Last Vitals:  Vitals:   10/24/19 1400 10/24/19 1415  BP: 133/79 130/77  Pulse: 94 91  Resp: 12 10  Temp:    SpO2: 92% 92%    Last Pain:  Vitals:   10/24/19 1330  TempSrc:   PainSc: 10-Worst pain ever                 Cythina Mickelsen,W. EDMOND

## 2019-10-24 NOTE — H&P (Signed)
[address]       [place] [address]             [phone]       No events since her clinic appointment She continues to have dysphagia, and reflux.  OR today for EGD, robotic assisted paraesophageal hernia repair.  Per my clinic note  Brooke Ellis #[id number] Date of Birth: 04/20/1950  Referring: Brooke Isaac, MD Primary Care: Derinda Late, MD Primary Cardiologist: No primary care provider on file.  Chief Complaint:        Chief Complaint  Patient presents with  . Consult    Hiatal hernia, lung nodules; Chest CT 06/21/19    History of Present Illness:    Brooke Ellis 69 y.o. female presents for surgical evaluation of a large paraesophageal hernia.  She was admitted for 10 for an aspiration pneumonia following an EGD in March of this year.  She required a decortication at that time as well.  In regards to her symptoms, she denies any reflux, but has been on 40mg  of prilosec twice daily for a number of years.  When she comes off of it, she has pretty severe reflux.  Her weight has been stable.  She denies any dysphagia, or odynophagia, but states that she has pretty significant early satiety which has been present for over a decade.  She does have a chronic cough, and also admits to early morning scratch throat regularly.             Past Medical History:  Diagnosis Date  . Allergy    seasonal  . Anemia   . Anxiety   . Asthma   . Depression   . Gallstones   . GERD (gastroesophageal reflux disease)   . Hearing loss   . High cholesterol   . Impaired glucose tolerance   . Internal hemorrhoids   . Osteopenia   . Overweight   . Urinary tract infection   . Vitamin D deficiency          Past Surgical History:  Procedure Laterality Date  . CHOLECYSTECTOMY    . COLONOSCOPY    . FOOT SURGERY    . GALLBLADDER SURGERY    . POLYPECTOMY    . TUBAL LIGATION    .  UPPER GASTROINTESTINAL ENDOSCOPY    . VIDEO ASSISTED THORACOSCOPY (VATS)/DECORTICATION Left 04/02/2019   Procedure: VIDEO ASSISTED THORACOSCOPY (VATS)/DECORTICATION, Drainage of Emypema.;  Surgeon: Brooke Isaac, MD;  Location: [facility];  Service: Thoracic;  Laterality: Left;  [name] Kitchen VIDEO BRONCHOSCOPY N/A 04/02/2019   Procedure: VIDEO BRONCHOSCOPY;  Surgeon: Brooke Isaac, MD;  Location: [facility] OR;  Service: Thoracic;  Laterality: N/A;         Family History  Problem Relation Age of Onset  . Crohn's disease Daughter   . Diverticulitis Sister   . Colon cancer Neg Hx   . Gallbladder disease Neg Hx   . Esophageal cancer Neg Hx   . Pancreatic cancer Neg Hx   . Rectal cancer Neg Hx   . Stomach cancer Neg Hx   . Colon polyps Neg Hx      Social History      Tobacco Use  Smoking Status Never Smoker  Smokeless Tobacco Never Used    Social History       Substance and Sexual Activity  Alcohol Use No  . Alcohol/week: 0.0 standard drinks  Allergies  Allergen Reactions  . Macrodantin     Chemical pneumonia   . Morphine And Related     hallucinations   . Sulfa Antibiotics Itching and Swelling          Current Outpatient Medications  Medication Sig Dispense Refill  . buPROPion (WELLBUTRIN XL) 300 MG 24 hr tablet Take 300 mg by mouth daily.      . Cholecalciferol (VITAMIN D) 2000 UNITS tablet Take 2,000 Units by mouth daily.    [name] Kitchen ezetimibe (ZETIA) 10 MG tablet Take 10 mg by mouth every evening.     . ferrous sulfate 325 (65 FE) MG tablet Take 1 tablet (325 mg total) by mouth 2 (two) times daily with a meal. (Patient taking differently: Take 325 mg by mouth daily. ) 180 tablet 1  . FLUoxetine (PROZAC) 10 MG capsule Take 10 mg by mouth daily.      [name] Kitchen guaiFENesin (MUCINEX) 600 MG 12 hr tablet Take 1 tablet (600 mg total) by mouth 2 (two) times daily.    [name] Kitchen ketotifen (ZADITOR) 0.025 % ophthalmic solution Place 1 drop into both eyes daily  as needed. For seasonal allergies     . montelukast (SINGULAIR) 10 MG tablet Take 10 mg by mouth daily.     [name] Kitchen omeprazole (PRILOSEC) 40 MG capsule Take 1 tablet 30 minutes before breakfast and 1 tablet 30 minutes before supper 60 capsule 11  . ondansetron (ZOFRAN-ODT) 4 MG disintegrating tablet Take 4 mg by mouth every 8 (eight) hours as needed for nausea or vomiting.     . rosuvastatin (CRESTOR) 40 MG tablet Take 40 mg by mouth daily.     No current facility-administered medications for this visit.    Review of Systems  Constitutional: Negative for malaise/fatigue and weight loss.  HENT: Positive for congestion and sore throat.   Respiratory: Positive for cough and shortness of breath.   Cardiovascular: Negative.   Gastrointestinal: Positive for heartburn. Negative for abdominal pain and vomiting.  Musculoskeletal: Negative.   Neurological: Negative.      PHYSICAL EXAMINATION: BP 139/89 (BP Location: Left Arm, Patient Position: Sitting, Cuff Size: Normal)   Pulse 87   Temp 98.8 F (37.1 C) (Temporal)   Resp 20   Ht 4\' 11"  (1.499 m)   Wt 136 lb (61.7 kg)   SpO2 95% Comment: RA  BMI 27.47 kg/m  Physical Exam  Constitutional: She is oriented to person, place, and time.  HENT:  Head: Normocephalic and atraumatic.  Nose: Nose normal.  Eyes: Conjunctivae are normal.  Cardiovascular: Normal rate.  Respiratory: Effort normal. No respiratory distress.  GI: Normal appearance. She exhibits no distension.  Musculoskeletal:        General: Normal range of motion.     Cervical back: Normal range of motion.  Neurological: She is alert and oriented to person, place, and time.  Skin: Skin is warm and dry.  Psychiatric: Mood normal.    Diagnostic Studies & Laboratory data:    CT Scan: IMPRESSION: 1. Resolved thoracic adenopathy. 2. Resolved left lung pneumonia with residual thick pleural-parenchymal scarring at the left lung base. No residual pleural  effusions. 3. Moderate to large hiatal hernia. Mildly patulous thoracic esophagus with layering debris, suggesting chronic esophageal Dysmotility.  EGD/EUS:  03/06/19 - There were esophageal mucosal changes secondary to established short-segment Barrett's disease present in the lower third of the esophagus. The maximum longitudinal extent of these mucosal changes was 2 cm in length (C2, M2). There was no active  inflammation, nodularity, or other irregularity. The mucosa was biopsied with a cold forceps for histology. - The stomach was normal except for a large hiatal hernia. - The examined duodenum was normal. - The cardia and gastric fundus were normal on retroflexion.  Manometry: none Path:   Surgical [P], distal esophagus - SQUAMOUS AND GLANDULAR EPITHELIUM WITH ACUTE AND CHRONIC INFLAMMATION - INTESTINAL METAPLASIA PRESENT - NO DYSPLASIA OR MALIGNANCY IDENTIFIED      I have independently reviewed the above radiology studies  and reviewed the findings with the patient.   Recent Lab Findings: Recent Labs       Lab Results  Component Value Date   WBC 11.2 (H) 04/05/2019   HGB 9.7 (L) 04/05/2019   HCT 30.4 (L) 04/05/2019   PLT 685 (H) 04/05/2019   GLUCOSE 98 04/05/2019   TRIG 72 03/28/2019   ALT 38 04/04/2019   AST 38 04/04/2019   NA 140 04/05/2019   K 3.8 04/05/2019   CL 100 04/05/2019   CREATININE 0.60 04/05/2019   BUN 5 (L) 04/05/2019   CO2 29 04/05/2019   INR 1.2 04/02/2019   HGBA1C 6.6 (H) 04/04/2019       Assessment / Plan:   69 yo female with large paraesophageal hiatal hernia, and hx of severe reflux, controlled on medication, and hx of aspiration.  I explained to her the risk, and benefits of surgical repair via an EGD, and  robotic assisted paraesophageal hernia repair, with fundoplication.  I do think that her aspiration is likely due to the hernia.  Additionally, with repair she may be able to come off her reflux  medication.  She is agree able to proceed, and would like to wait until October.  She will see me back in clinic in September

## 2019-10-24 NOTE — Op Note (Signed)
[address]       [place] [address]             [phone]        10/24/2019  Patient:  Brooke Ellis Pre-Op Dx: Paraesophageal hernia   History of aspiration pneumonia   Gastroesophageal reflux Post-op Dx: Paraesophageal hernia   History of aspiration pneumonia   Gastroesophageal reflux   Left diaphragm Bochdalek hernia. Procedure: - Esophagoscopy - Robotic assisted laparoscopy - Paraesophageal hernia repair ACell mesh - Primary repair of left Bochdalek hernia with buttressed ACell mesh pledgets. - Gastropexy   Surgeon and Role:      * Lajuana Matte, MD - Primary    *M. Roddenberry, PA-C- assisting Anesthesia  general EBL: Minimal l Blood Administration: None Specimen: None   Counts: correct   Indications: 69 yo female with large paraesophageal hiatal hernia, and hx of severe reflux, controlled on medication, and hx of aspiration.  I explained to her the risk, and benefits of surgical repair via an EGD, and robotic assisted paraesophageal hernia repair.  Due to concern for esophageal dysmotility, I elected not to proceed with a fundoplication. I do think that her aspiration is likely due to the hernia.  She is agree able to proceed.  Findings: Pallidus esophageal mucosa on EGD.  It was very difficult to pass the scope into the stomach given some element of gastric volvulus.  The mucosa on the esophagus and the stomach appeared viable.  During laparoscopy, a large hiatal hernia was clearly evident.  Stomach and omentum was herniated through the reflux.  When mobilization of the short gastrics a large Bochdalek hernia was encountered on the left diaphragm.  It measured approximately 4 x 2 cm.  Primary repair of the hiatus was performed with interrupted suture.  She had good integrity to her muscle, but due to the left-sided hernia I elected to repair this with an ACell mesh.  Left diaphragmatic hernia was repaired with buttressed ACell  pledgets.  There was good laxity in the diaphragm and I was able to repair this primarily.  It was closed in 2 layers using a V-Loc suture.  Operative Technique: After the risks, benefits and alternatives were thoroughly discussed, the patient was brought to the operative theatre.  Anesthesia was induced, and the esophagoscope was passed through the oropharynx down to the stomach.  The scope was retroflexed and the hiatal hernia was clearly evident.  The scope was pulled back and the mucosal surface of the esophagus was visualized.    The scope was then parked at 25 cm from the incisors.  The patient was then prepped and draped in normal sterile fashion.  An appropriate surgical pause was performed, and pre-operative antibiotics were dosed accordingly.  We began with a 1 cm incision 15 cm caudad from the xiphoid and slightly lateral to the umbilicus.  Using an Optiview we entered the peritoneal space.  The abdomen was then insufflated with CO2.  3 other robotic ports were placed to triangulate the hiatus.  Another 12 mm port was placed in place at the level of the umbilicus laterally for an assistant port and another 5 mm trocar was placed in the right lower quadrant for liver retractor.  The patient was then placed in steep reverse Trendelenburg and the liver was elevated to expose the esophageal hiatus.  And then the robot was docked.  We began by dividing the gastrohepatic ligament to expose the right diaphragmatic crus and  then dissected the hernia sac in a clockwise fashion to mobilize there the stomach and esophagus.  We then divided the short gastrics and moved towards the right crus and completed our dissection along the esophageal hiatus.  While more mobilizing the fundus of the stomach, left diaphragmatic hernia was encountered.  Omentum was herniated through it.  The omentum was removed and all adhesions were lysed.  We then focused our attention back to the hiatal hernia.  A Penrose drain was then  used to encircle the the esophagus and we continued our dissection up into the mediastinum.  Once we had achieved 3 to 4 cm of intra-abdominal esophagus we then proceeded to reapproximate the crura with 0 Ethibond sutures in an interrupted fashion.  The gastroscope was passed down through the lower esophageal sphincter into the stomach and would act as our bougie during this repair. ACell U-shaped mesh was then cut to fashion and tacked through the crural repair.  An air leak test was performed using the gastroscope.  No leak was evident.    Next we focused our attention back to the Bochdalek hernia.  There was good laxity in the diaphragm and it would come back together with her primary closure.  With the remnant pieces of the ACell mesh we used this as a buttress.  2 V-Loc sutures were ran into an opposing directions to close the hernia along with the buttressed ACell mesh.  I then oversewed the repair with another V-Loc suture.    The liver retractor was removed and all ports were removed under direct visualization.  The skin and soft tissue were closed with absorbable suture    The patient tolerated the procedure without any immediate complications, and was transferred to the PACU in stable condition.  Daniqua Campoy Bary Leriche

## 2019-10-24 NOTE — Transfer of Care (Signed)
Immediate Anesthesia Transfer of Care Note  Patient: Brooke Ellis  Procedure(s) Performed: XI ROBOTIC ASSISTED PARAESOPHAGEAL HERNIA REPAIR WITH GASTROPEXY (N/A Chest) ESOPHAGOGASTRODUODENOSCOPY (EGD) (N/A ) XI ROBOTIC ASSISTED REPAIR OF DIAPHRAGMATIC HERNIA  Patient Location: PACU  Anesthesia Type:General  Level of Consciousness: drowsy  Airway & Oxygen Therapy: Patient Spontanous Breathing and Patient connected to face mask oxygen  Post-op Assessment: Report given to RN and Post -op Vital signs reviewed and stable  Post vital signs: Reviewed and stable  Last Vitals:  Vitals Value Taken Time  BP 132/76 10/24/19 1259  Temp    Pulse 96 10/24/19 1301  Resp 17 10/24/19 1301  SpO2 95 % 10/24/19 1301  Vitals shown include unvalidated device data.  Last Pain:  Vitals:   10/24/19 0652  TempSrc:   PainSc: 0-No pain      Patients Stated Pain Goal: 2 (61/51/83 4373)  Complications: No complications documented.

## 2019-10-24 NOTE — Addendum Note (Signed)
Addendum  created 10/24/19 1444 by Reece Agar, CRNA   Flowsheet accepted, Intraprocedure Flowsheets edited

## 2019-10-24 NOTE — Anesthesia Procedure Notes (Signed)
Procedure Name: Intubation Date/Time: 10/24/2019 8:53 AM Performed by: Reece Agar, CRNA Pre-anesthesia Checklist: Patient identified, Emergency Drugs available, Suction available and Patient being monitored Patient Re-evaluated:Patient Re-evaluated prior to induction Oxygen Delivery Method: Circle System Utilized Preoxygenation: Pre-oxygenation with 100% oxygen Induction Type: IV induction, Rapid sequence and Cricoid Pressure applied Laryngoscope Size: Mac and 3 Grade View: Grade I Tube type: Oral Tube size: 7.0 mm Number of attempts: 1 Airway Equipment and Method: Stylet Placement Confirmation: ETT inserted through vocal cords under direct vision,  positive ETCO2 and breath sounds checked- equal and bilateral Secured at: 21 cm Tube secured with: Tape Dental Injury: Teeth and Oropharynx as per pre-operative assessment

## 2019-10-24 NOTE — Plan of Care (Signed)

## 2019-10-25 ENCOUNTER — Inpatient Hospital Stay (HOSPITAL_COMMUNITY): Payer: Medicare Other

## 2019-10-25 ENCOUNTER — Encounter (HOSPITAL_COMMUNITY): Payer: Self-pay | Admitting: Thoracic Surgery (Cardiothoracic Vascular Surgery)

## 2019-10-25 DIAGNOSIS — K449 Diaphragmatic hernia without obstruction or gangrene: Secondary | ICD-10-CM

## 2019-10-25 LAB — BASIC METABOLIC PANEL
Anion gap: 12 (ref 5–15)
BUN: 7 mg/dL — ABNORMAL LOW (ref 8–23)
CO2: 23 mmol/L (ref 22–32)
Calcium: 8.8 mg/dL — ABNORMAL LOW (ref 8.9–10.3)
Chloride: 102 mmol/L (ref 98–111)
Creatinine, Ser: 0.72 mg/dL (ref 0.44–1.00)
GFR calc non Af Amer: >60 mL/min (ref >60)
Glucose, Bld: 134 mg/dL — ABNORMAL HIGH (ref 70–99)
Potassium: 3.7 mmol/L (ref 3.5–5.1)
Sodium: 137 mmol/L (ref 135–145)

## 2019-10-25 LAB — CBC
HCT: 39.3 % (ref 36.0–46.0)
Hemoglobin: 12.7 g/dL (ref 12.0–15.0)
MCH: 28.9 pg (ref 26.0–34.0)
MCHC: 32.3 g/dL (ref 30.0–36.0)
MCV: 89.5 fL (ref 80.0–100.0)
Platelets: 248 10*3/uL (ref 150–400)
RBC: 4.39 MIL/uL (ref 3.87–5.11)
RDW: 13.1 % (ref 11.5–15.5)
WBC: 12.8 10*3/uL — ABNORMAL HIGH (ref 4.0–10.5)
nRBC: 0 % (ref 0.0–0.2)

## 2019-10-25 MED ORDER — TRAMADOL HCL 50 MG PO TABS: 50.0000 mg | ORAL_TABLET | ORAL | 0 refills | Status: AC | PRN

## 2019-10-25 MED ORDER — ONDANSETRON 4 MG PO TBDP: 4.0000 mg | ORAL_TABLET | Freq: Three times a day (TID) | ORAL | 0 refills | Status: DC | PRN

## 2019-10-25 MED ORDER — IOHEXOL 300 MG/ML  SOLN
80.0000 mL | Freq: Once | INTRAMUSCULAR | Status: AC | PRN
  Administered 2019-10-25: 80 mL via ORAL

## 2019-10-25 MED ORDER — HYDROCODONE-ACETAMINOPHEN 7.5-325 MG/15ML PO SOLN: 15.0000 mL | ORAL | 0 refills | Status: DC | PRN

## 2019-10-25 NOTE — Care Management CC44 (Signed)
Condition Code 44 Documentation Completed  Patient Details  Name: Brooke Ellis MRN: [id number] Date of Birth: 20-Aug-1950   Condition Code 44 given:  Yes Patient signature on Condition Code 44 notice:  Yes Documentation of 2 MD's agreement:  Yes Code 44 added to claim:  Yes    Zenon Mayo, RN 10/25/2019, 3:34 PM

## 2019-10-25 NOTE — Progress Notes (Signed)
Full diet served tolerated 20 %. Denied any discomfort.

## 2019-10-25 NOTE — Progress Notes (Signed)
Transported to radiology for barium swallow.

## 2019-10-25 NOTE — Progress Notes (Signed)
      [address]       [place] [address]             [phone]       Spoke with Ms. Wintle and family this morning. She remains on 3L [place] which nursing attempted to wean earlier and her saturation dropped into the 80s. Incentive spirometer ordered.   Results from the swallow study shared and diet changed to full liquids.  Possible discharge later today if oxygen can be weaned. She may need some medications changed to liquid.    Nicholes Rough, PA-C

## 2019-10-25 NOTE — Progress Notes (Signed)
TCTS PA checked on the result of chest x-ray done this am . No order.

## 2019-10-25 NOTE — Progress Notes (Signed)
O2 weaned off to room air, desats to 80's placed on o2 2l .continue to monitor.

## 2019-10-25 NOTE — Progress Notes (Signed)
SATURATION QUALIFICATIONS: (This note is used to comply with regulatory documentation for home oxygen)  Patient Saturations on Room Air at Rest = 92%  Patient Saturations on Room Air while Ambulating = 92%  Patient Saturation 2 Liters of oxygen while Ambulating = 94%  Please briefly explain why patient needs home oxygen:pt does not need o2 on discharge.

## 2019-10-25 NOTE — Progress Notes (Signed)
      [address]       [place] [address]             [phone]      1 Day Post-Op Procedure(s) (LRB): XI ROBOTIC ASSISTED PARAESOPHAGEAL HERNIA REPAIR WITH GASTROPEXY (N/A) ESOPHAGOGASTRODUODENOSCOPY (EGD) (N/A) XI ROBOTIC ASSISTED REPAIR OF DIAPHRAGMATIC HERNIA Subjective: Awake and alert, having some nausea but says pain is well controlled.   Objective: Vital signs in last 24 hours: Temp:  [97 F (36.1 C)-98.2 F (36.8 C)] 98.2 F (36.8 C) (10/07 0404) Pulse Rate:  [90-97] 91 (10/07 0700) Cardiac Rhythm: Normal sinus rhythm (10/07 0742) Resp:  [10-17] 15 (10/07 0600) BP: (124-145)/(70-85) 131/74 (10/07 0600) SpO2:  [90 %-96 %] 95 % (10/07 0600) Arterial Line BP: (145-159)/(65-72) 146/65 (10/06 1415) Weight:  [67.1 kg] 67.1 kg (10/06 1714)     Intake/Output from previous day: 10/06 0701 - 10/07 0700 In: 3063 [I.V.:2713; IV Piggyback:350] Out: 430 [Urine:400; Blood:30] Intake/Output this shift: No intake/output data recorded.  General appearance: alert, cooperative, mild distress and related to nausea Neurologic: intact Heart: regular rate and rhythm Lungs: Breath sounds are clear.  Abdomen: Soft, expected mild peri-incisional tenderness.  Wound: Port incision are intact and dry.   Lab Results: Recent Labs    10/24/19 1529 10/25/19 0027  WBC 12.4* 12.8*  HGB 13.1 12.7  HCT 40.4 39.3  PLT 267 248   BMET:  Recent Labs    10/22/19 1335 10/22/19 1335 10/24/19 0925 10/24/19 1529 10/25/19 0027  NA 138   < > 140  --  137  K 3.7   < > 3.7  --  3.7  CL 104  --   --   --  102  CO2 22  --   --   --  23  GLUCOSE 91  --   --   --  134*  BUN 7*  --   --   --  7*  CREATININE 0.80   < >  --  0.75 0.72  CALCIUM 8.8*  --   --   --  8.8*   < > = values in this interval not displayed.    PT/INR:  Recent Labs    10/22/19 1335  LABPROT 12.5  INR 1.0   ABG    Component Value Date/Time   PHART 7.421 10/24/2019 0925   HCO3 27.2  10/24/2019 0925   TCO2 28 10/24/2019 0925   O2SAT 100.0 10/24/2019 0925   CBG (last 3)  No results for input(s): GLUCAP in the last 72 hours.  Assessment/Plan: S/P Procedure(s) (LRB): XI ROBOTIC ASSISTED PARAESOPHAGEAL HERNIA REPAIR WITH GASTROPEXY (N/A) ESOPHAGOGASTRODUODENOSCOPY (EGD) (N/A) XI ROBOTIC ASSISTED REPAIR OF DIAPHRAGMATIC HERNIA  -POD1 robotic-assisted repair of paraesophageal hernia and repair of diaphragmatic hernia. CXR improved. Swallow study planned for this morning.  Mobilize. Hope to start PO's later today. On Zofran for nausea.   -DVT PPX- start [place] Lovenox this evening.  LOS: 1 day    Antony Odea, PA-C [phone] 10/25/2019

## 2019-10-25 NOTE — Care Management Obs Status (Signed)
[facility] NOTIFICATION   Patient Details  Name: Brooke Ellis MRN: [id number] Date of Birth: [birth year]   Medicare Observation Status Notification Given:  Yes    Zenon Mayo, RN 10/25/2019, 3:35 PM

## 2019-10-25 NOTE — Discharge Summary (Signed)
Physician Discharge Summary  Patient ID: Brooke Ellis MRN: [id number] DOB/AGE: 68-Feb-1952 68 y.o.  Admit date: 10/24/2019 Discharge date: 10/25/2019  Admission Diagnoses:  Paraesophageal hernia Severe gastroesophageal reflux   Discharge Diagnoses:   Paraesophageal hernia Left diaphragmatic hernia Severe gastroesophageal reflux S/P repair of paraesophageal hernia   Discharged Condition: stable   History of Present Illness: Brooke Ellis y.o.femalepresents for surgical evaluation of a large paraesophageal hernia. She was admitted for an aspiration pneumonia following an EGD in March of this year. She required a decortication at that time as well. In regards to her symptoms, she denies any reflux, but has been on 40mg  of prilosec twice daily for a number of years. When she comes off of it, she has pretty severe reflux. Her weight has been stable. She denies any dysphagia, or odynophagia, but states that she has pretty significant early satiety which has been present for over a decade. She does have a chronic cough, and also admits to early morning scratch throat regularly.  Surgical repair of the paraesophageal hernia was discussed with her by Dr. Kipp Brood and she decided to proceed with surgery.  Hospital Course:   Mr. Nevin Bloodgood was admitted for elective surgery on 10/24/2019.  She was prepared and taken to the operating room where robotic assisted paraesophageal hernia was carried out using ACell mesh.  Additionally, a left diaphragmatic hernia was identified and also repaired with a 2 layer closure incorporating ACell mesh as well.  Following the procedures, she was extubated and recovered in the postanesthesia care unit. She was later transferred to [facility] progressive care.  Her vital signs and respiratory status remained stable.  Pain was well controlled with IV Toradol and fentanyl.  On the first postoperative day, she was alert and oriented and reasonably comfortable.  She  did have some nausea.  An esophagram was obtained on the morning of postop day 1 with results as detailed in the report below.  Following this study,  she was permitted to begin a full liquid diet which she tolerated without any trouble.  She was mobilized and tolerated this as well. She maintained adequate oxygen saturation on room air while ambulating.  The findings of the esophagram were discussed with her and her husband by Dr. Kipp Brood.  He also discussed with her discharge home on the afternoon of postop day 1 and this plan was agreed upon. Prior to discharge, she was carefully instructed regarding medications, activity, and wound care.  She is advised to continue with a full liquid diet.  She is advised to crush all medications and take with food.  Consults: None  Significant Diagnostic Studies:   EXAM: CT CHEST AND ABDOMEN WITHOUT CONTRAST  TECHNIQUE: Multidetector CT imaging of the chest and abdomen was performed following the standard protocol without intravenous contrast.  COMPARISON:  05/24/2019 chest radiograph. 03/28/2019 chest CT angiogram.  FINDINGS: CT CHEST FINDINGS WITHOUT CONTRAST  Cardiovascular: Normal heart size. No significant pericardial effusion/thickening. Great vessels are normal in course and caliber.  Mediastinum/Nodes: No discrete thyroid nodules. Mildly patulous thoracic esophagus with layering debris. No axillary adenopathy. No pathologically enlarged mediastinal or discrete hilar nodes on this noncontrast scan. Previously described mild AP window and left hilar adenopathy appears resolved.  Lungs/Pleura: No pneumothorax. No residual pleural effusions. Residual mild smooth basilar left pleural thickening. A few scattered solid pulmonary nodules in the right greater than left lungs, largest 4 mm in the peripheral right middle lobe (series 4/image 86), obscured by atelectasis on the prior chest CT angiogram  study. Thick subpleural parenchymal  bands in peripheral lingula and left lower lobe, compatible with pleural-parenchymal scarring. No acute consolidative airspace disease or lung masses.  Musculoskeletal: No aggressive appearing focal osseous lesions. Chronic stable severe T11 vertebral compression fracture.  CT ABDOMEN FINDINGS WITHOUT CONTRAST  Hepatobiliary: Normal liver with no liver mass. Cholecystectomy. No biliary ductal dilatation.  Pancreas: Normal, with no mass or duct dilation.  Spleen: Normal size. No mass.  Adrenals/Urinary Tract: Normal adrenals. No renal stones. No hydronephrosis. No contour deforming renal masses.  Stomach/Bowel: Moderate to large hiatal hernia. Stomach is nondistended and otherwise grossly normal. Visualized small and large bowel is normal caliber, with no bowel wall thickening.  Vascular/Lymphatic: Normal caliber abdominal aorta. No pathologically enlarged lymph nodes in the abdomen.  Other: No pneumoperitoneum, ascites or focal fluid collection. Tiny fat containing umbilical hernia.  Musculoskeletal: No aggressive appearing focal osseous lesions. Moderate lumbar spondylosis.  IMPRESSION: 1. Resolved thoracic adenopathy. 2. Resolved left lung pneumonia with residual thick pleural-parenchymal scarring at the left lung base. No residual pleural effusions. 3. Moderate to large hiatal hernia. Mildly patulous thoracic esophagus with layering debris, suggesting chronic esophageal dysmotility. 4. Scattered small solid pulmonary nodules, largest 4 mm, obscured by atelectasis on the prior chest CT angiogram study. No follow-up needed if patient is low-risk (and has no known or suspected primary neoplasm). Non-contrast chest CT can be considered in 12 months if patient is high-risk. This recommendation follows the consensus statement: Guidelines for Management of Incidental Pulmonary Nodules Detected on CT Images: From the Fleischner Society 2017; Radiology 2017;  284:228-243. 5. Chronic stable severe T11 vertebral compression fracture.   Electronically Signed   By: Ilona Sorrel M.D.   On: 06/21/2019 10:05   EXAM: PORTABLE CHEST 1 VIEW  COMPARISON:  October 22, 2019.  FINDINGS: Stable cardiac silhouette. Minimal right basilar subsegmental atelectasis is noted. Pneumomediastinum is now noted with right supraclavicular subcutaneous emphysema. Increased left basilar opacity is noted concerning for atelectasis or infiltrate with the possibility of a left basilar hydropneumothorax. Bony thorax is unremarkable.  IMPRESSION: Pneumomediastinum is now noted with right supraclavicular subcutaneous emphysema. Increased left basilar opacity is noted concerning for atelectasis or infiltrate with the possibility of a left basilar hydropneumothorax. Minimal right basilar subsegmental atelectasis is noted. CT scan is recommended for further evaluation. These results will be called to the ordering clinician or representative by the Radiologist Assistant, and communication documented in the PACS or zVision Dashboard.   Electronically Signed   By: Marijo Conception M.D.   On: 10/25/2019 08:17   EXAM: ESOPHOGRAM/BARIUM SWALLOW  TECHNIQUE: Single contrast examination was performed using water-soluble contrast and thin barium.  FLUOROSCOPY TIME:  Fluoroscopy Time:  3 minutes and 18 seconds  Radiation Exposure Index (if provided by the fluoroscopic device): Not applicable.  Number of Acquired Spot Images: 0  COMPARISON:  Chest radiograph 10/25/2019  FINDINGS: Exam was performed with the patient in LPO position. Expected limitations in the immediate postoperative setting secondary to immobility.  Suspicion of a diverticulum off the lower cervical esophagus, most likely left lateral including on image 67 of series 1.  The esophagus is mildly dilated. Moderate esophageal dysmotility with contrast stasis throughout the  esophagus and proximal escape waves. Interval hernia repair, without evidence of contrast extravasation.  IMPRESSION: 1. No evidence of contrast extravasation to suggest postoperative leak. 2. Mild limitations, including patient immobility and the significant esophageal dysmotility (presumably presbyesophagus). 3. Lower cervical esophageal diverticulum, suboptimally evaluated on this nondedicated exam. This could be  re-evaluated on routine outpatient esophagram, if indicated.   Electronically Signed   By: Abigail Miyamoto M.D.   On: 10/25/2019 08:41  Treatments:   OPERATIVE NOTE 10/24/2019  Patient:  Brooke Ellis Pre-Op Dx: Paraesophageal hernia                         History of aspiration pneumonia                         Gastroesophageal reflux Post-op Dx: Paraesophageal hernia                         History of aspiration pneumonia                         Gastroesophageal reflux                         Left diaphragm Bochdalek hernia. Procedure: - Esophagoscopy - Robotic assisted laparoscopy - Paraesophageal hernia repair ACell mesh - Primary repair of left Bochdalek hernia with buttressed ACell mesh pledgets. - Gastropexy   Surgeon and Role:      * Lajuana Matte, MD - Primary    *M. Burnham Trost, PA-C- assisting Anesthesia  general EBL: Minimal l Blood Administration: None Specimen: None   Counts: correct   Indications: 69 yo female with large paraesophageal hiatal hernia, and hx of severe reflux, controlled on medication, and hx of aspiration.  I explained to her the risk, and benefits of surgical repair via an EGD, and robotic assisted paraesophageal hernia repair.  Due to concern for esophageal dysmotility, I elected not to proceed with a fundoplication. I do think that her aspiration is likely due to the hernia.  She is agree able to proceed.  Findings: Pallidus esophageal mucosa on EGD.  It was very difficult to pass the scope into the  stomach given some element of gastric volvulus.  The mucosa on the esophagus and the stomach appeared viable.  During laparoscopy, a large hiatal hernia was clearly evident.  Stomach and omentum was herniated through the reflux.  When mobilization of the short gastrics a large Bochdalek hernia was encountered on the left diaphragm.  It measured approximately 4 x 2 cm.  Primary repair of the hiatus was performed with interrupted suture.  She had good integrity to her muscle, but due to the left-sided hernia I elected to repair this with an ACell mesh.  Left diaphragmatic hernia was repaired with buttressed ACell pledgets.  There was good laxity in the diaphragm and I was able to repair this primarily.  It was closed in 2 layers using a V-Loc suture.   Discharge Exam: Blood pressure 129/71, pulse 87, temperature 98.4 F (36.9 C), temperature source Oral, resp. rate 17, height 4\' 11"  (1.499 m), weight 67.1 kg, SpO2 92 %.   General appearance: alert, cooperative, mild distress and related to nausea Neurologic: intact Heart: regular rate and rhythm Lungs: Breath sounds are clear.  Abdomen: Soft, expected mild peri-incisional tenderness.  Wound: Port incision are intact and dry.   Disposition:    Allergies as of 10/25/2019      Reactions   Macrodantin    Chemical pneumonia    Morphine And Related    hallucinations    Sulfa Antibiotics Itching, Swelling      Medication List    TAKE these medications  buPROPion 300 MG 24 hr tablet Commonly known as: WELLBUTRIN XL Take 300 mg by mouth daily.   ezetimibe 10 MG tablet Commonly known as: ZETIA Take 10 mg by mouth every evening.   ferrous sulfate 325 (65 FE) MG tablet Take 1 tablet (325 mg total) by mouth 2 (two) times daily with a meal.   FLUoxetine 10 MG capsule Commonly known as: PROZAC Take 10 mg by mouth daily.   guaiFENesin 600 MG 12 hr tablet Commonly known as: MUCINEX Take 1 tablet (600 mg total) by mouth 2 (two) times  daily. What changed: when to take this   ketotifen 0.025 % ophthalmic solution Commonly known as: ZADITOR Place 1 drop into both eyes daily as needed (allergies).   montelukast 10 MG tablet Commonly known as: SINGULAIR Take 10 mg by mouth daily.   omeprazole 40 MG capsule Commonly known as: PRILOSEC Take 1 tablet 30 minutes before breakfast and 1 tablet 30 minutes before supper What changed:   how much to take  how to take this  when to take this  additional instructions   ondansetron 4 MG disintegrating tablet Commonly known as: ZOFRAN-ODT Take 4 mg by mouth every 8 (eight) hours as needed for nausea or vomiting. What changed: Another medication with the same name was added. Make sure you understand how and when to take each.   ondansetron 4 MG disintegrating tablet Commonly known as: Zofran ODT Take 1 tablet (4 mg total) by mouth every 8 (eight) hours as needed for nausea or vomiting. What changed: You were already taking a medication with the same name, and this prescription was added. Make sure you understand how and when to take each.   rosuvastatin 40 MG tablet Commonly known as: CRESTOR Take 40 mg by mouth daily.   traMADol 50 MG tablet Commonly known as: Ultram Take 1 tablet (50 mg total) by mouth every 4 (four) hours as needed for up to 7 days for moderate pain or severe pain.   Vitamin D 50 MCG (2000 UT) tablet Take 2,000 Units by mouth daily.       Follow-up Information    Lajuana Matte, MD. Go on 11/02/2019.   Specialty: Cardiothoracic Surgery Why: Your appointment is at 11:15am.  Contact information: [place] [address] [phone]               Signed: Antony Odea, PA-C 10/25/2019, 2:28 PM

## 2019-10-25 NOTE — Progress Notes (Signed)
discharged home accompanied by husband . Discharge instructions given to pt. Belongings taken home

## 2019-10-25 NOTE — Discharge Instructions (Signed)
Discharge Instructions:  1. You may shower, please wash incisions daily with soap and water and keep dry.  If you wish to cover wounds with dressing you may do so but please keep clean and change daily.  No tub baths or swimming until incisions have completely healed.  If your incisions become red or develop any drainage please call our office at [phone]  2. No Driving until cleared by Dr. Abran Duke office and you are no longer using narcotic pain medications  3.Continue with full liquid diet. Crush all oral medication and take with food.   4. Fever of 101.5 for at least 24 hours with no source, please contact our office at [phone]  5. Activity- up as tolerated, please walk at least 3 times per day.  Avoid strenuous activity.  6. If any questions or concerns arise, please do not hesitate to contact our office at [phone]

## 2019-11-02 ENCOUNTER — Ambulatory Visit (INDEPENDENT_AMBULATORY_CARE_PROVIDER_SITE_OTHER): Payer: Self-pay | Admitting: Thoracic Surgery (Cardiothoracic Vascular Surgery)

## 2019-11-02 ENCOUNTER — Other Ambulatory Visit: Payer: Self-pay

## 2019-11-02 ENCOUNTER — Encounter: Payer: Self-pay | Admitting: Thoracic Surgery (Cardiothoracic Vascular Surgery)

## 2019-11-02 VITALS — BP 112/76 | HR 98 | Resp 20 | Ht 59.0 in | Wt 141.6 lb

## 2019-11-02 DIAGNOSIS — Z9889 Other specified postprocedural states: Secondary | ICD-10-CM

## 2019-11-02 DIAGNOSIS — Z8719 Personal history of other diseases of the digestive system: Secondary | ICD-10-CM

## 2019-11-02 NOTE — Progress Notes (Signed)
      [address]       [place] [address]             [phone]        Senya [place] Medical Record #[id number] Date of Birth: 1950-10-26  Referring: [name], MD Primary Care: Derinda Late, MD Primary Cardiologist:No primary care provider on file.  Reason for visit:   follow-up  History of Present Illness:     Mrs. Flaim presents for her 1 week follow-up appointment.  Overall she is doing quite well.  She denies any reflux or dysphagia.  She has been taking in some soft foods without any difficulty.  Her pain is very minimal.  Physical Exam: BP 112/76 (BP Location: Left Arm, Patient Position: Sitting)   Pulse 98   Resp 20   Ht 4\' 11"  (1.499 m)   Wt 141 lb 9.6 oz (64.2 kg)   SpO2 94% Comment: RA with mask on  BMI 28.60 kg/m   Alert NAD Incision clean.  Abdomen soft, ND No peripheral edema       Assessment / Plan:   69 year old female status post robotic repair of a paraesophageal hernia, and a right Bochdalek hernia.  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  Of note she also has a history of Barrett's esophagus.  Given repair of her paraesophageal hernia she denies any reflux and has been off of her omeprazole since surgery.  She does have a new history of clear secretions of cough.  This may be relating to her Zenker's diverticulum.  She will need to continue to follow-up for her Barrett's esophagus.  I will see her back in 1 month with a chest x-ray.  It advised her to advance her diet, and she no longer needs to crush her medications.   Lajuana Matte 11/02/2019 12:10 PM

## 2019-11-28 ENCOUNTER — Other Ambulatory Visit: Payer: Self-pay | Admitting: Thoracic Surgery (Cardiothoracic Vascular Surgery)

## 2019-11-28 DIAGNOSIS — J90 Pleural effusion, not elsewhere classified: Secondary | ICD-10-CM

## 2019-11-29 ENCOUNTER — Other Ambulatory Visit: Payer: Self-pay

## 2019-11-29 ENCOUNTER — Ambulatory Visit
Admission: RE | Admit: 2019-11-29 | Discharge: 2019-11-29 | Disposition: A | Discharge status: Home / Self Care | Payer: Medicare Other | Source: Ambulatory Visit | Attending: Thoracic Surgery (Cardiothoracic Vascular Surgery) | Admitting: Thoracic Surgery (Cardiothoracic Vascular Surgery)

## 2019-11-29 ENCOUNTER — Encounter: Payer: Self-pay | Admitting: Thoracic Surgery (Cardiothoracic Vascular Surgery)

## 2019-11-29 ENCOUNTER — Ambulatory Visit: Payer: Medicare Other | Admitting: Thoracic Surgery (Cardiothoracic Vascular Surgery)

## 2019-11-29 ENCOUNTER — Ambulatory Visit (INDEPENDENT_AMBULATORY_CARE_PROVIDER_SITE_OTHER): Payer: Self-pay | Admitting: Thoracic Surgery (Cardiothoracic Vascular Surgery)

## 2019-11-29 VITALS — BP 120/73 | HR 93 | Resp 18 | Ht 59.0 in | Wt 142.0 lb

## 2019-11-29 DIAGNOSIS — Z8719 Personal history of other diseases of the digestive system: Secondary | ICD-10-CM

## 2019-11-29 DIAGNOSIS — Z9889 Other specified postprocedural states: Secondary | ICD-10-CM

## 2019-11-29 DIAGNOSIS — J90 Pleural effusion, not elsewhere classified: Secondary | ICD-10-CM

## 2019-11-29 NOTE — Progress Notes (Signed)
      [address]       [place] [address]             [phone]        Brooke Ellis  Medical Record #[id number] Date of Birth: 1950/08/15  Referring: [name], MD Primary Care: Derinda Late, MD Primary Cardiologist:No primary care provider on file.  Reason for visit:   follow-up  History of Present Illness:     Brooke Ellis comes in for her 1 month follow-up appointment.  Overall she states that she is doing better and that her respiratory symptoms have improved and she is able to tolerate more p.o. intake.  She did state that she has restarted her antireflux medication not necessarily for reflux symptoms but for fear of reflux.  She has experienced a fullness that has developed over the last 2 weeks in her mid chest, and has woken up with increased secretions.  A week ago she also was very hoarse for an unknown reason.  Physical Exam: BP 120/73 (BP Location: Right Arm, Patient Position: Sitting)   Pulse 93   Resp 18   Ht 4\' 11"  (1.499 m)   Wt 142 lb (64.4 kg)   SpO2 94% Comment: RA with mask on  BMI 28.68 kg/m   Alert NAD Incision clean.   Abdomen soft, ND    Diagnostic Studies & Laboratory data: CXR: Right-sided effusion which is small.  She also has an air-fluid level in the mediastinum concerning for recurrence of her hiatal hernia.     Assessment / Plan:   69 year old female status post paraesophageal hernia repair with ACell mesh, and Bochdalek's hernia repair with ACell mesh as well.  She also has evidence of a Zenker's diverticulum on her swallow study.  Based off of her chest x-ray I am concerned that she may have early recurrence of her hiatal hernia.  I have ordered an esophagram for further evaluation of this.  It is possible that she may just have some residual fluid in her mediastinum from her original repair.  I will follow up with a virtual visit in 2 weeks to discuss the results.   Brooke Ellis 11/29/2019 1:50 PM

## 2019-11-30 ENCOUNTER — Ambulatory Visit: Payer: Medicare Other | Admitting: Thoracic Surgery (Cardiothoracic Vascular Surgery)

## 2019-12-04 ENCOUNTER — Ambulatory Visit (HOSPITAL_COMMUNITY): Admission: RE | Admit: 2019-12-04 | Payer: Medicare Other | Source: Ambulatory Visit

## 2019-12-07 ENCOUNTER — Ambulatory Visit (HOSPITAL_COMMUNITY): Admission: RE | Admit: 2019-12-07 | Payer: Medicare Other | Source: Ambulatory Visit

## 2019-12-07 ENCOUNTER — Other Ambulatory Visit: Payer: Self-pay

## 2019-12-07 ENCOUNTER — Ambulatory Visit (HOSPITAL_COMMUNITY)
Admission: RE | Admit: 2019-12-07 | Discharge: 2019-12-07 | Disposition: A | Discharge status: Home / Self Care | Payer: Medicare Other | Source: Ambulatory Visit | Attending: Thoracic Surgery (Cardiothoracic Vascular Surgery) | Admitting: Thoracic Surgery (Cardiothoracic Vascular Surgery)

## 2019-12-07 DIAGNOSIS — Z8719 Personal history of other diseases of the digestive system: Secondary | ICD-10-CM

## 2019-12-07 DIAGNOSIS — Z9889 Other specified postprocedural states: Secondary | ICD-10-CM | POA: Diagnosis not present

## 2019-12-21 ENCOUNTER — Other Ambulatory Visit: Payer: Self-pay

## 2019-12-21 ENCOUNTER — Telehealth: Payer: Self-pay | Admitting: Thoracic Surgery (Cardiothoracic Vascular Surgery)

## 2020-03-21 ENCOUNTER — Telehealth (INDEPENDENT_AMBULATORY_CARE_PROVIDER_SITE_OTHER): Payer: Medicare Other | Admitting: Thoracic Surgery (Cardiothoracic Vascular Surgery)

## 2020-03-21 ENCOUNTER — Other Ambulatory Visit: Payer: Self-pay

## 2020-03-21 DIAGNOSIS — Z8719 Personal history of other diseases of the digestive system: Secondary | ICD-10-CM

## 2020-03-21 NOTE — Progress Notes (Signed)
     [address]       [place] [address]             [phone]       Patient: Home Provider: Office Consent for Telemedicine visit obtained.  Today's visit was completed via a real-time telehealth (see specific modality noted below). The patient/authorized person provided oral consent at the time of the visit to engage in a telemedicine encounter with the present provider at [facility]. The patient/authorized person was informed of the potential benefits, limitations, and risks of telemedicine. The patient/authorized person expressed understanding that the laws that protect confidentiality also apply to telemedicine. The patient/authorized person acknowledged understanding that telemedicine does not provide emergency services and that he or she would need to call 911 or proceed to the nearest hospital for help if such a need arose.  . Total time spent in the clinical discussion 5 minutes. . Telehealth Modality: Phone visit (audio only)  I had a telephone visit with Mrs. Cutshaw. Overall she is doing well. She continues to take her omeprazole, and only occasionally has reflux at night. She denies any dysphagia or diet aphasia. She remains hesitant about undergoing a redo operation. She states that she will contact us prior to her next endoscopy.  Earline Stiner Bary Leriche

## 2020-06-02 ENCOUNTER — Other Ambulatory Visit: Payer: Self-pay | Admitting: Family Medicine

## 2020-06-03 ENCOUNTER — Other Ambulatory Visit: Payer: Self-pay | Admitting: Family Medicine

## 2020-06-03 ENCOUNTER — Ambulatory Visit
Admission: RE | Admit: 2020-06-03 | Discharge: 2020-06-03 | Disposition: A | Discharge status: Home / Self Care | Payer: Medicare Other | Source: Ambulatory Visit | Attending: Family Medicine | Admitting: Family Medicine

## 2020-06-03 DIAGNOSIS — R059 Cough, unspecified: Secondary | ICD-10-CM

## 2020-06-03 LAB — SARS CORONAVIRUS 2 (TAT 6-24 HRS): SARS Coronavirus 2: NEGATIVE

## 2021-01-16 ENCOUNTER — Other Ambulatory Visit: Payer: Self-pay | Admitting: Family Medicine

## 2021-01-16 ENCOUNTER — Ambulatory Visit
Admission: RE | Admit: 2021-01-16 | Discharge: 2021-01-16 | Disposition: A | Discharge status: Home / Self Care | Payer: Medicare Other | Source: Ambulatory Visit | Attending: Family Medicine | Admitting: Family Medicine

## 2021-01-16 ENCOUNTER — Other Ambulatory Visit: Payer: Self-pay

## 2021-01-16 DIAGNOSIS — R058 Other specified cough: Secondary | ICD-10-CM

## 2021-01-16 LAB — SARS CORONAVIRUS 2 (TAT 6-24 HRS): SARS Coronavirus 2: NEGATIVE

## 2021-03-31 ENCOUNTER — Other Ambulatory Visit: Payer: Self-pay | Admitting: Family Medicine

## 2021-04-02 LAB — SARS CORONAVIRUS 2 (TAT 6-24 HRS): SARS Coronavirus 2: NEGATIVE

## 2021-08-17 ENCOUNTER — Other Ambulatory Visit: Payer: Self-pay | Admitting: Family Medicine

## 2021-08-17 ENCOUNTER — Ambulatory Visit
Admission: RE | Admit: 2021-08-17 | Discharge: 2021-08-17 | Disposition: A | Discharge status: Home / Self Care | Payer: Medicare Other | Source: Ambulatory Visit | Attending: Family Medicine | Admitting: Family Medicine

## 2021-08-17 DIAGNOSIS — Z09 Encounter for follow-up examination after completed treatment for conditions other than malignant neoplasm: Secondary | ICD-10-CM

## 2022-02-03 ENCOUNTER — Encounter: Payer: Self-pay | Admitting: Internal Medicine

## 2022-03-08 IMAGING — CT CT ABDOMEN W/O CM
2 of 4 series · 14 of 46 positions shown, 16 images · non-contrast
Comparison: 05/24/2019 chest radiograph. 03/28/2019 chest CT
angiogram.

CLINICAL DATA: Follow-up thoracic adenopathy after recent
pneumonia. History of large hiatal hernia and abnormal chest
radiograph.

EXAM:
CT CHEST AND ABDOMEN WITHOUT CONTRAST
TECHNIQUE: Multidetector CT imaging of the chest and abdomen was performed
following the standard protocol without intravenous contrast.

[Series 2: cap 5.00 br40 s3 · axial · 0.80mm/px · z∈[+1502,+1837]mm · 11 of 81 slices shown, 13 images]
[im 7/81  soft-tissue]
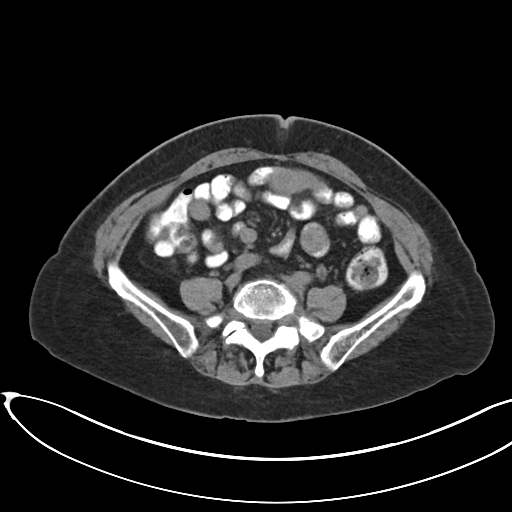
[im 7/81  bone]
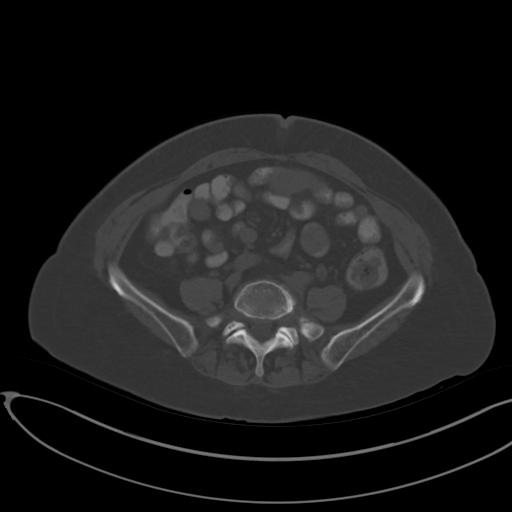
[im 14/81  soft-tissue]
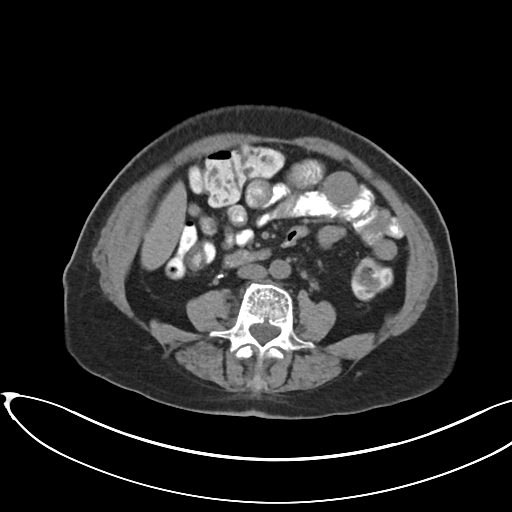
[im 21/81  soft-tissue]
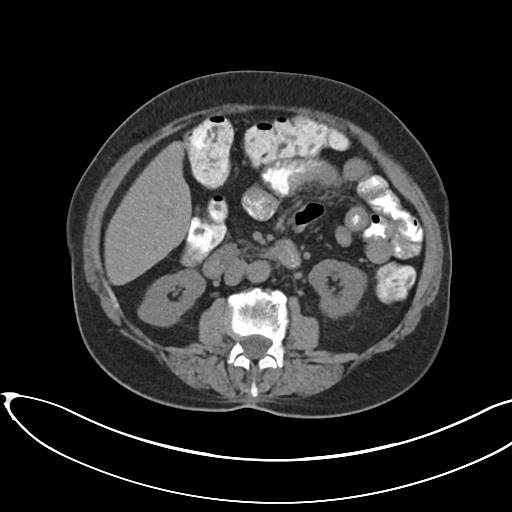
[im 27/81  soft-tissue]
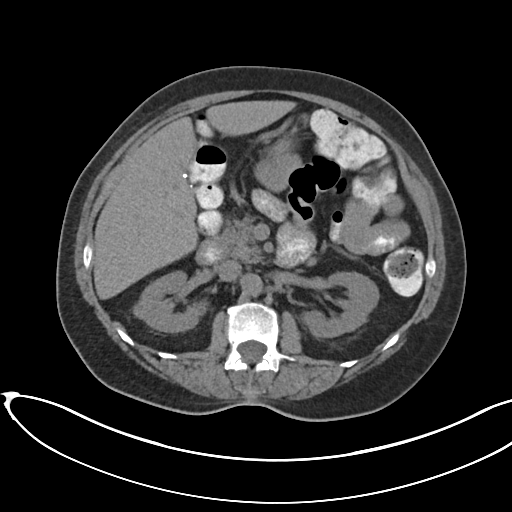
[im 34/81  soft-tissue]
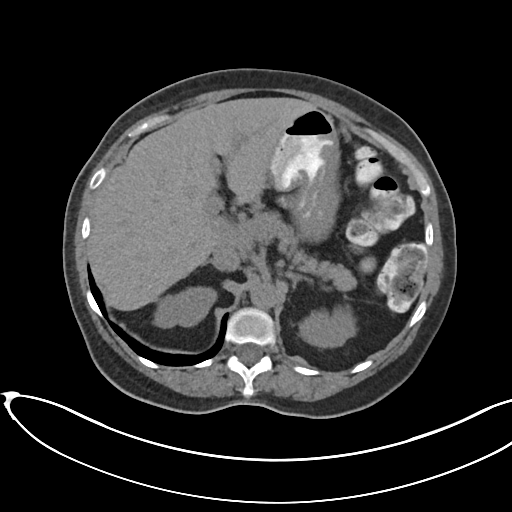
[im 41/81  soft-tissue]
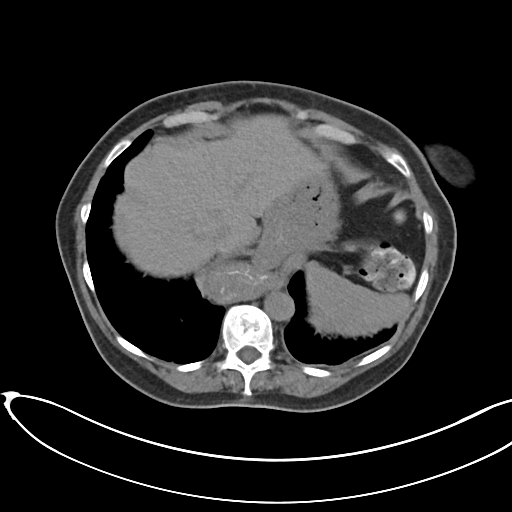
[im 47/81  soft-tissue]
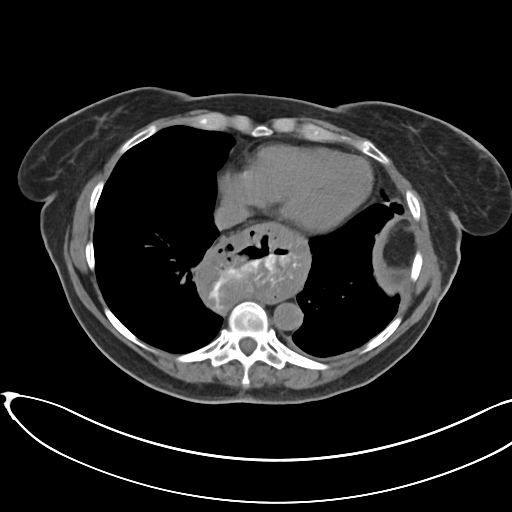
[im 54/81  soft-tissue]
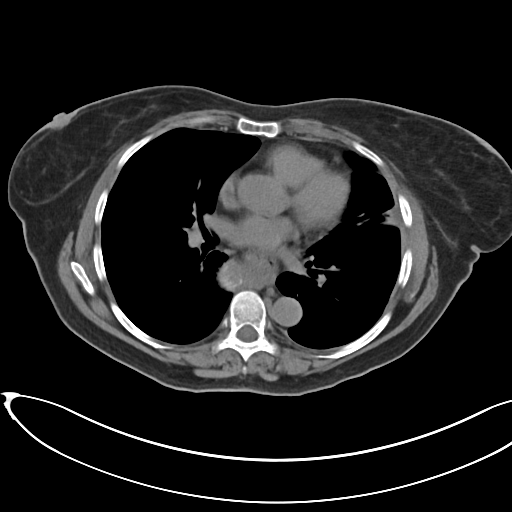
[im 61/81  soft-tissue]
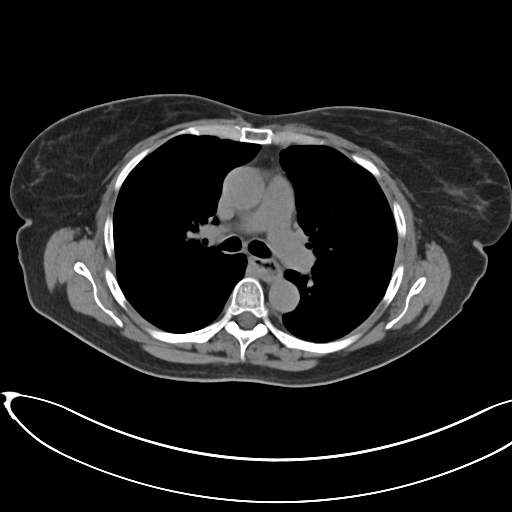
[im 61/81  bone]
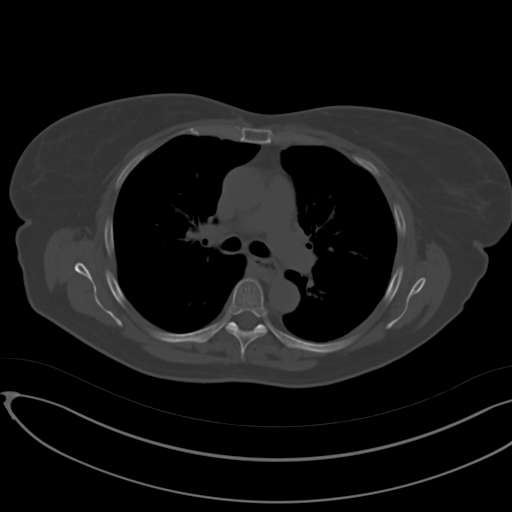
[im 67/81  soft-tissue]
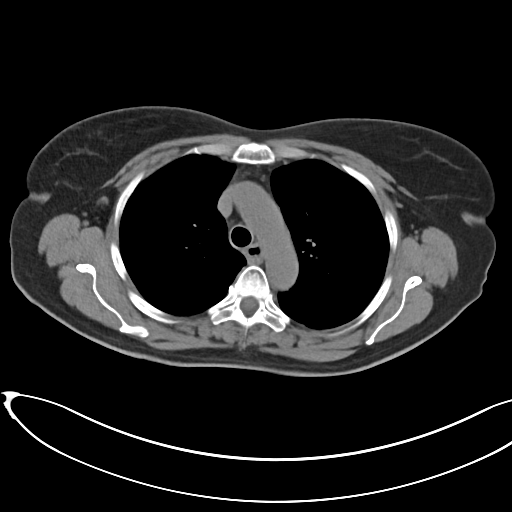
[im 74/81  soft-tissue]
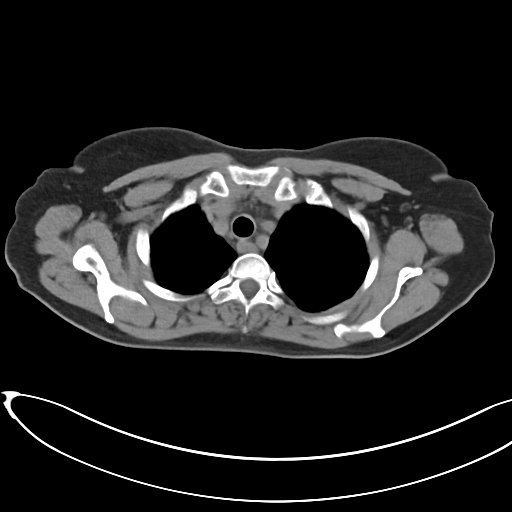

[Series 6: cap 2.00 br40 s3 · coronal · 0.80mm/px · 3 of 205 slices shown]
[im 69/205  soft-tissue]
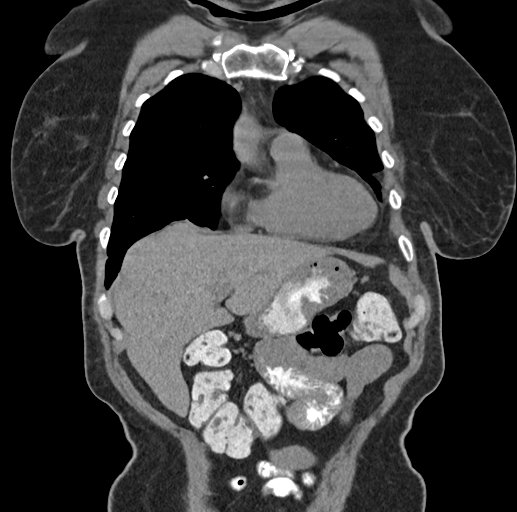
[im 91/205  soft-tissue]
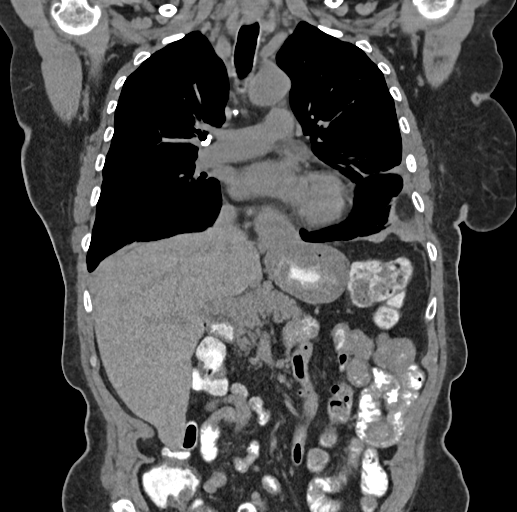
[im 114/205  soft-tissue]
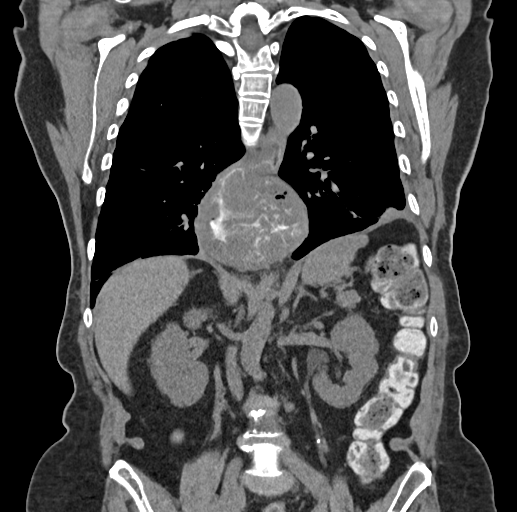

[14 of 46 positions shown; findings below may reference images not displayed]

FINDINGS: CT CHEST FINDINGS WITHOUT CONTRAST

Cardiovascular: Normal heart size. No significant pericardial
effusion/thickening. Great vessels are normal in course and caliber.

Mediastinum/Nodes: No discrete thyroid nodules. Mildly patulous
thoracic esophagus with layering debris. No axillary adenopathy. No
pathologically enlarged mediastinal or discrete hilar nodes on this
noncontrast scan. Previously described mild AP window and left hilar
adenopathy appears resolved.

Lungs/Pleura: No pneumothorax. No residual pleural effusions.
Residual mild smooth basilar left pleural thickening. A few
scattered solid pulmonary nodules in the right greater than left
lungs, largest 4 mm in the peripheral right middle lobe (series
4/image 86), obscured by atelectasis on the prior chest CT angiogram
study. Thick subpleural parenchymal bands in peripheral lingula and
left lower lobe, compatible with pleural-parenchymal scarring. No
acute consolidative airspace disease or lung masses.

Musculoskeletal: No aggressive appearing focal osseous lesions.
Chronic stable severe T11 vertebral compression fracture.

CT ABDOMEN FINDINGS WITHOUT CONTRAST

Hepatobiliary: Normal liver with no liver mass. Cholecystectomy. No
biliary ductal dilatation.

Pancreas: Normal, with no mass or duct dilation.

Spleen: Normal size. No mass.

Adrenals/Urinary Tract: Normal adrenals. No renal stones. No
hydronephrosis. No contour deforming renal masses.

Stomach/Bowel: Moderate to large hiatal hernia. Stomach is
nondistended and otherwise grossly normal. Visualized small and
large bowel is normal caliber, with no bowel wall thickening.

Vascular/Lymphatic: Normal caliber abdominal aorta. No
pathologically enlarged lymph nodes in the abdomen.

Other: No pneumoperitoneum, ascites or focal fluid collection. Tiny
fat containing umbilical hernia.

Musculoskeletal: No aggressive appearing focal osseous lesions.
Moderate lumbar spondylosis.
IMPRESSION: 1. Resolved thoracic adenopathy.
2. Resolved left lung pneumonia with residual thick
pleural-parenchymal scarring at the left lung base. No residual
pleural effusions.
3. Moderate to large hiatal hernia. Mildly patulous thoracic
esophagus with layering debris, suggesting chronic esophageal
dysmotility.
4. Scattered small solid pulmonary nodules, largest 4 mm, obscured
by atelectasis on the prior chest CT angiogram study. No follow-up
needed if patient is low-risk (and has no known or suspected primary
neoplasm). Non-contrast chest CT can be considered in 12 months if
patient is high-risk. This recommendation follows the consensus
statement: Guidelines for Management of Incidental Pulmonary Nodules
Detected on CT Images: From the [HOSPITAL] 6754; Radiology
5. Chronic stable severe T11 vertebral compression fracture.

## 2022-06-15 ENCOUNTER — Ambulatory Visit
Admission: RE | Admit: 2022-06-15 | Discharge: 2022-06-15 | Disposition: A | Discharge status: Home / Self Care | Payer: Medicare Other | Source: Ambulatory Visit | Attending: Family Medicine | Admitting: Family Medicine

## 2022-06-15 ENCOUNTER — Other Ambulatory Visit: Payer: Self-pay | Admitting: Family Medicine

## 2022-06-15 DIAGNOSIS — J209 Acute bronchitis, unspecified: Secondary | ICD-10-CM

## 2022-07-15 ENCOUNTER — Other Ambulatory Visit: Payer: Self-pay | Admitting: Family Medicine

## 2022-07-15 ENCOUNTER — Ambulatory Visit
Admission: RE | Admit: 2022-07-15 | Discharge: 2022-07-15 | Disposition: A | Discharge status: Home / Self Care | Payer: Medicare Other | Source: Ambulatory Visit | Attending: Family Medicine | Admitting: Family Medicine

## 2022-07-15 DIAGNOSIS — J40 Bronchitis, not specified as acute or chronic: Secondary | ICD-10-CM

## 2022-08-26 ENCOUNTER — Other Ambulatory Visit: Payer: Self-pay | Admitting: Family Medicine

## 2022-08-26 DIAGNOSIS — E611 Iron deficiency: Secondary | ICD-10-CM

## 2022-08-27 ENCOUNTER — Encounter: Payer: Self-pay | Admitting: Family Medicine

## 2022-09-14 ENCOUNTER — Ambulatory Visit
Admission: RE | Admit: 2022-09-14 | Discharge: 2022-09-14 | Disposition: A | Discharge status: Home / Self Care | Payer: Medicare Other | Source: Ambulatory Visit | Attending: Family Medicine | Admitting: Family Medicine

## 2022-09-14 DIAGNOSIS — E611 Iron deficiency: Secondary | ICD-10-CM

## 2022-10-21 ENCOUNTER — Encounter: Payer: Self-pay | Admitting: Cardiology

## 2023-02-09 ENCOUNTER — Ambulatory Visit
Admission: RE | Admit: 2023-02-09 | Discharge: 2023-02-09 | Disposition: A | Discharge status: Home / Self Care | Payer: Medicare Other | Source: Ambulatory Visit | Attending: Family Medicine | Admitting: Family Medicine

## 2023-02-09 ENCOUNTER — Other Ambulatory Visit: Payer: Self-pay | Admitting: Family Medicine

## 2023-02-09 DIAGNOSIS — M79672 Pain in left foot: Secondary | ICD-10-CM

## 2023-10-03 ENCOUNTER — Ambulatory Visit
Admission: RE | Admit: 2023-10-03 | Discharge: 2023-10-03 | Disposition: A | Discharge status: Home / Self Care | Source: Ambulatory Visit | Attending: Family Medicine | Admitting: Family Medicine

## 2023-10-03 ENCOUNTER — Other Ambulatory Visit: Payer: Self-pay | Admitting: Family Medicine

## 2023-10-03 DIAGNOSIS — R062 Wheezing: Secondary | ICD-10-CM

## 2023-10-03 DIAGNOSIS — R0989 Other specified symptoms and signs involving the circulatory and respiratory systems: Secondary | ICD-10-CM

## 2023-10-03 DIAGNOSIS — R051 Acute cough: Secondary | ICD-10-CM

## 2023-10-04 IMAGING — CR DG CHEST 2V
2 series · 2 of 2 positions shown · non-contrast
Comparison: June 03, 2020.

CLINICAL DATA: Cough.

EXAM:
CHEST - 2 VIEW

[w chest pa]
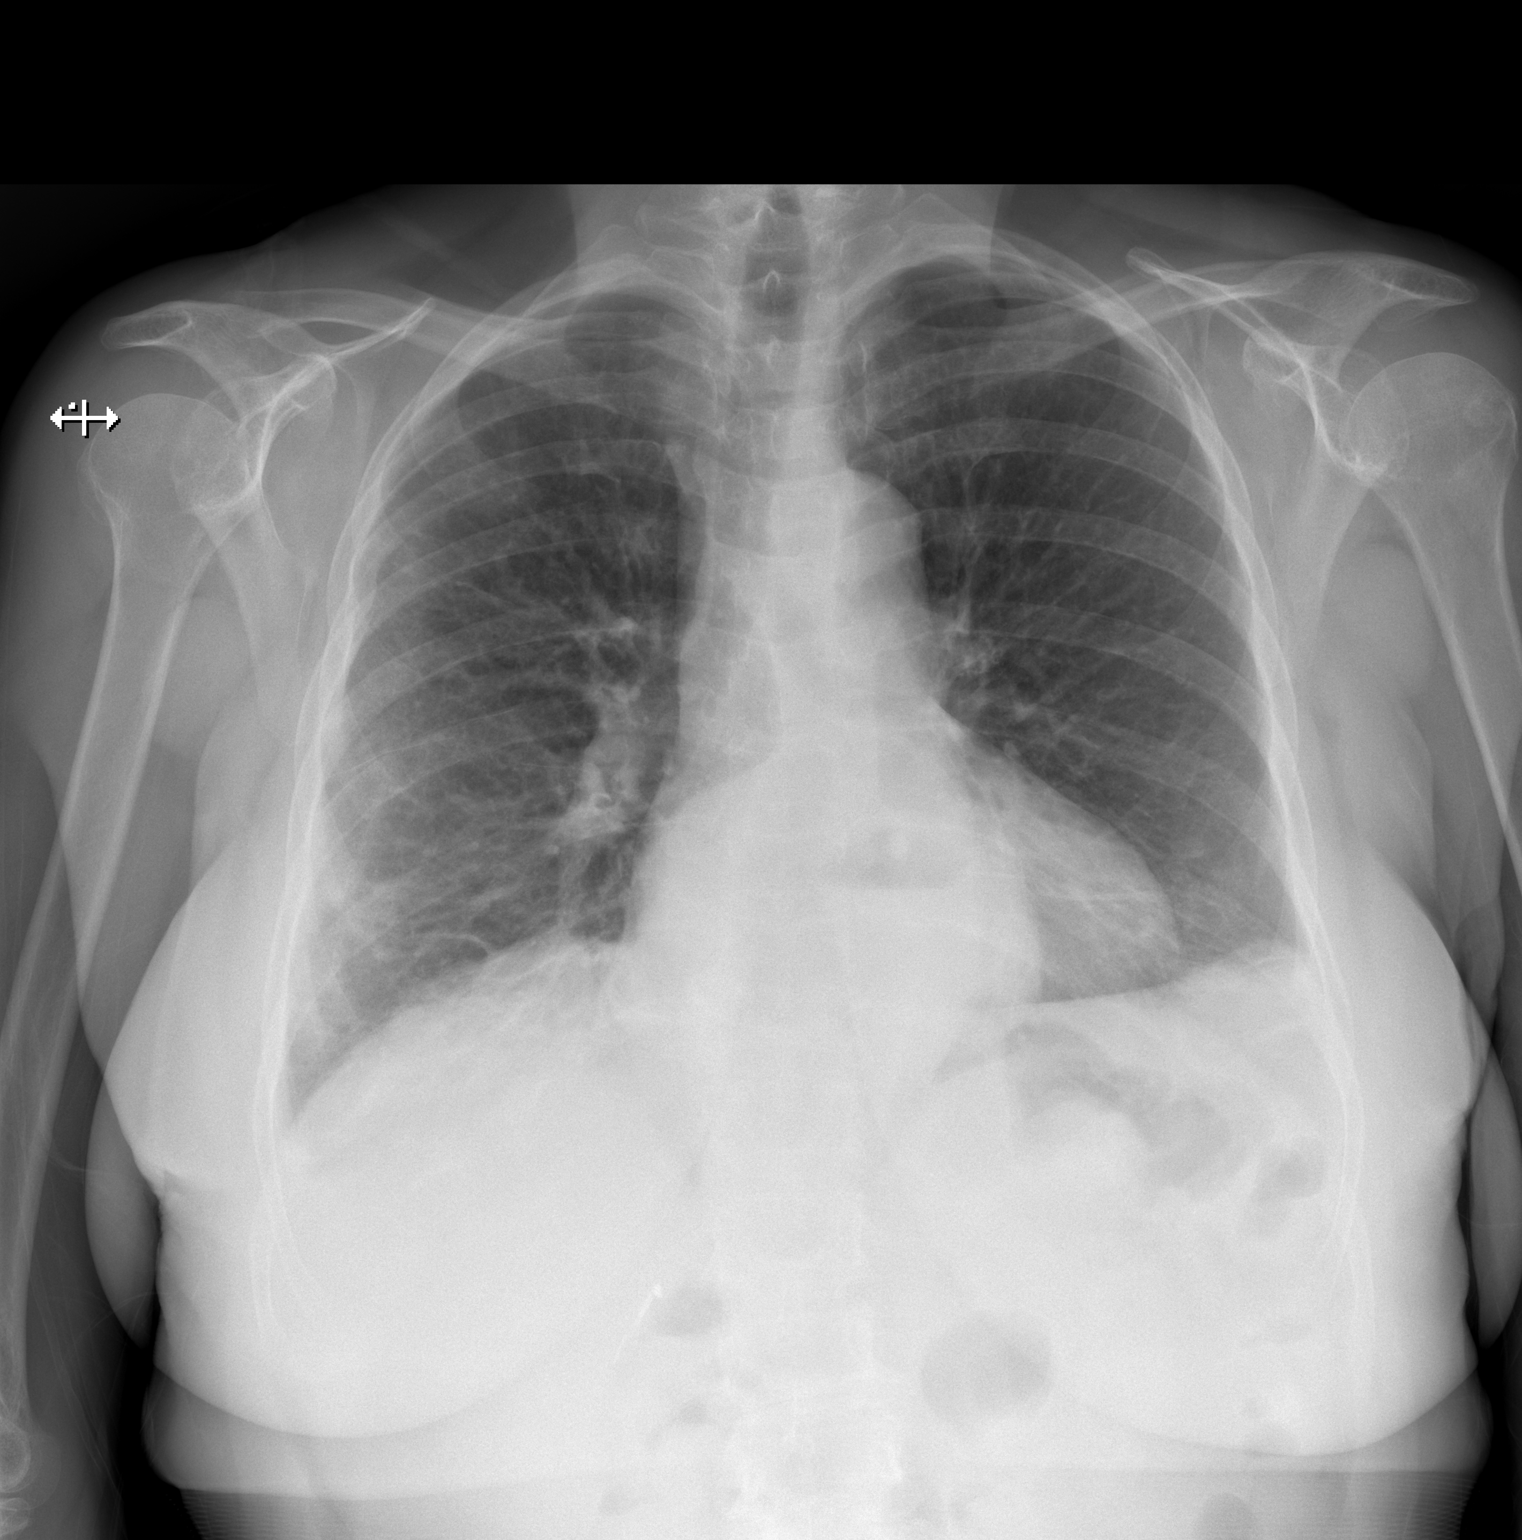

[w chest lat]
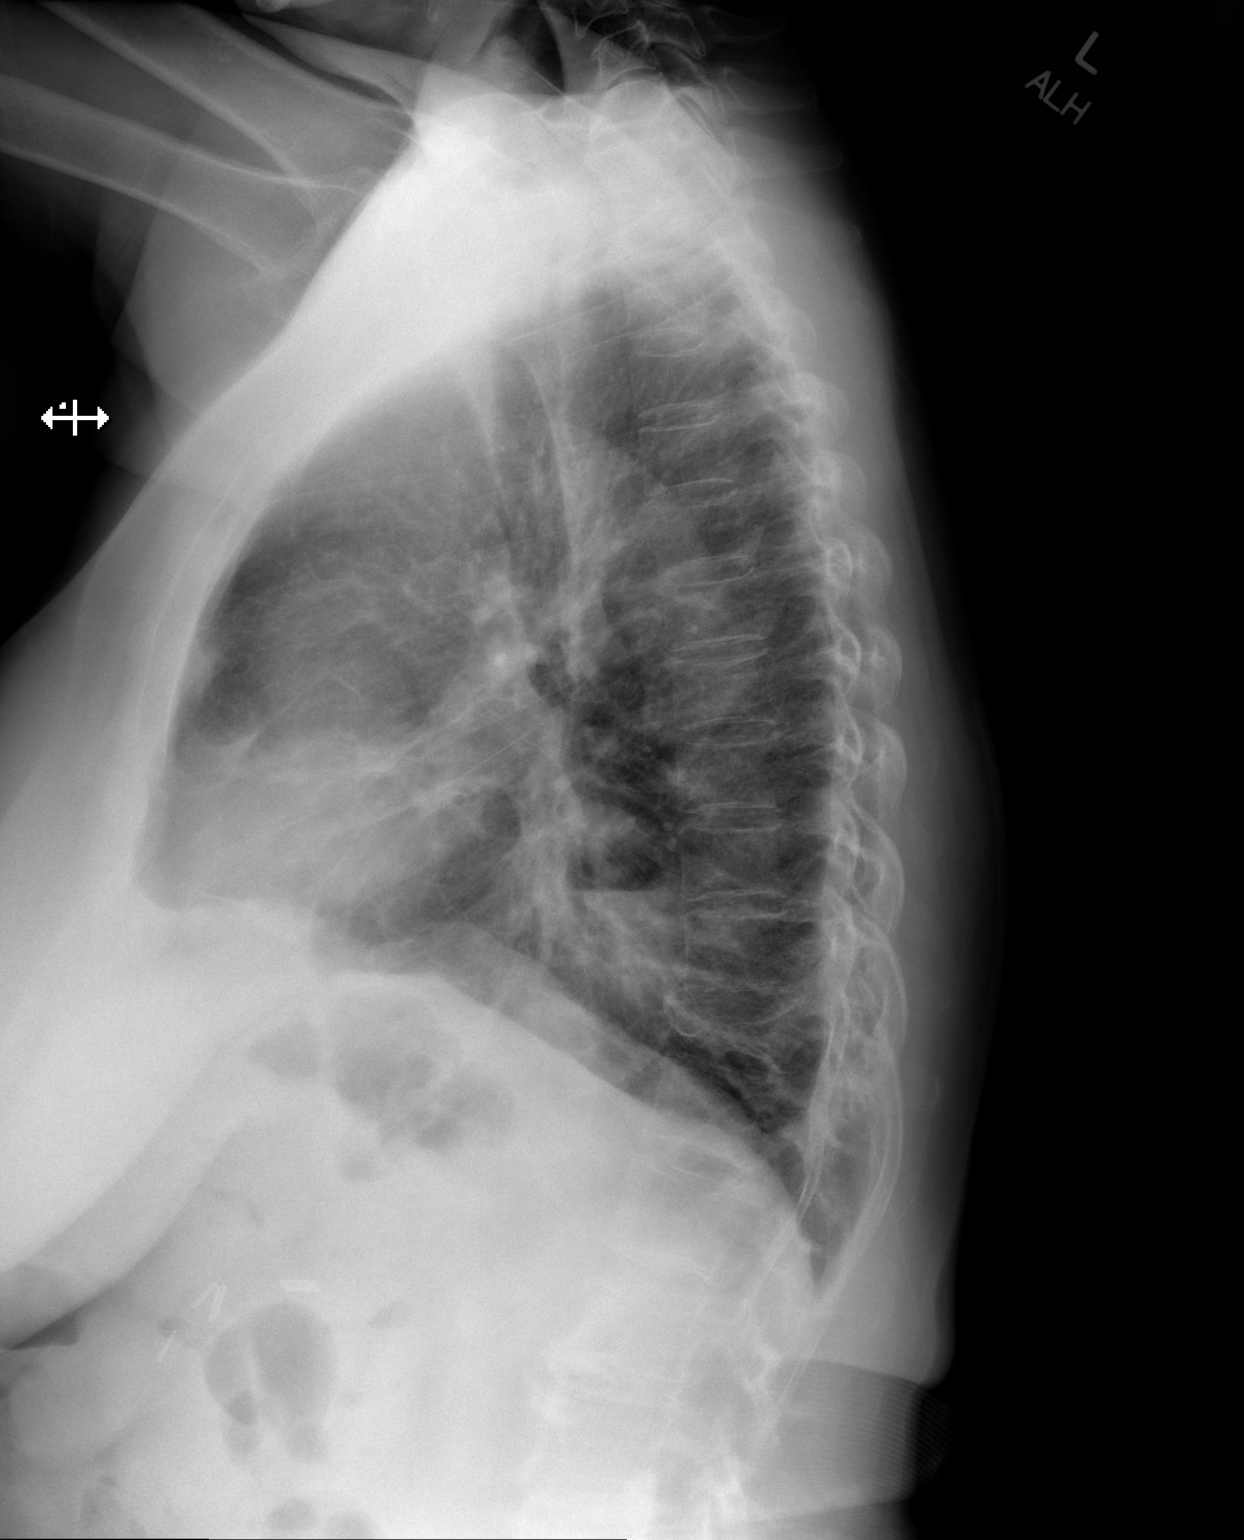

[2 of 2 positions shown; findings below may reference images not displayed]

FINDINGS: The heart size and mediastinal contours are within normal limits.
Moderate size hiatal hernia is noted. Left lung is clear. Increased
right basilar opacity is noted concerning for pneumonia or
atelectasis. The visualized skeletal structures are unremarkable.
IMPRESSION: Increased right basilar opacity is noted concerning for pneumonia or
atelectasis. Moderate size hiatal hernia.
